# Patient Record
Sex: Male | Born: 1945 | Race: White | Hispanic: No | Marital: Married | Smoking: Former smoker
Health system: Southern US, Community
[De-identification: ages and names within clinical notes are randomized; demographics above are authoritative.]

## PROBLEM LIST (undated history)

## (undated) DIAGNOSIS — L570 Actinic keratosis: Secondary | ICD-10-CM

## (undated) DIAGNOSIS — E785 Hyperlipidemia, unspecified: Secondary | ICD-10-CM

## (undated) DIAGNOSIS — K2100 Gastro-esophageal reflux disease with esophagitis, without bleeding: Secondary | ICD-10-CM

## (undated) DIAGNOSIS — H409 Unspecified glaucoma: Secondary | ICD-10-CM

## (undated) DIAGNOSIS — G43909 Migraine, unspecified, not intractable, without status migrainosus: Secondary | ICD-10-CM

## (undated) DIAGNOSIS — T7840XA Allergy, unspecified, initial encounter: Secondary | ICD-10-CM

## (undated) DIAGNOSIS — Z974 Presence of external hearing-aid: Secondary | ICD-10-CM

## (undated) DIAGNOSIS — L812 Freckles: Secondary | ICD-10-CM

## (undated) HISTORY — DX: Gastro-esophageal reflux disease with esophagitis: K21.0

## (undated) HISTORY — PX: CATARACT EXTRACTION, BILATERAL: SHX1313

## (undated) HISTORY — PX: EYE SURGERY: SHX253

## (undated) HISTORY — PX: NASAL SINUS SURGERY: SHX719

## (undated) HISTORY — DX: Hyperlipidemia, unspecified: E78.5

## (undated) HISTORY — DX: Allergy, unspecified, initial encounter: T78.40XA

## (undated) HISTORY — PX: HERNIA REPAIR: SHX51

## (undated) HISTORY — DX: Migraine, unspecified, not intractable, without status migrainosus: G43.909

## (undated) HISTORY — DX: Freckles: L81.2

## (undated) HISTORY — DX: Unspecified glaucoma: H40.9

## (undated) HISTORY — DX: Gastro-esophageal reflux disease with esophagitis, without bleeding: K21.00

## (undated) HISTORY — PX: NASAL FRACTURE SURGERY: SHX718

## (undated) HISTORY — DX: Actinic keratosis: L57.0

---

## 2000-06-16 HISTORY — PX: UMBILICAL HERNIA REPAIR: SHX196

## 2004-08-28 ENCOUNTER — Ambulatory Visit: Payer: Self-pay | Admitting: Internal Medicine

## 2006-07-13 ENCOUNTER — Encounter: Payer: Self-pay | Admitting: Internal Medicine

## 2007-03-16 ENCOUNTER — Encounter: Payer: Self-pay | Admitting: Internal Medicine

## 2007-04-19 ENCOUNTER — Ambulatory Visit: Payer: Self-pay | Admitting: Internal Medicine

## 2007-04-19 DIAGNOSIS — E119 Type 2 diabetes mellitus without complications: Secondary | ICD-10-CM | POA: Insufficient documentation

## 2007-04-19 DIAGNOSIS — H409 Unspecified glaucoma: Secondary | ICD-10-CM | POA: Insufficient documentation

## 2007-04-19 DIAGNOSIS — E785 Hyperlipidemia, unspecified: Secondary | ICD-10-CM

## 2007-04-19 DIAGNOSIS — E1169 Type 2 diabetes mellitus with other specified complication: Secondary | ICD-10-CM | POA: Insufficient documentation

## 2007-04-19 HISTORY — DX: Unspecified glaucoma: H40.9

## 2007-05-06 ENCOUNTER — Ambulatory Visit: Payer: Self-pay | Admitting: Internal Medicine

## 2007-05-20 ENCOUNTER — Ambulatory Visit: Payer: Self-pay | Admitting: Internal Medicine

## 2007-05-20 ENCOUNTER — Encounter: Payer: Self-pay | Admitting: Internal Medicine

## 2007-05-20 LAB — HM COLONOSCOPY

## 2007-05-24 ENCOUNTER — Ambulatory Visit: Payer: Self-pay | Admitting: Internal Medicine

## 2007-05-24 LAB — CONVERTED CEMR LAB
Bilirubin Urine: NEGATIVE
Glucose, Urine, Semiquant: NEGATIVE
Nitrite: NEGATIVE
Protein, U semiquant: NEGATIVE
Specific Gravity, Urine: <1.005
pH: 7.5

## 2007-06-23 ENCOUNTER — Telehealth (INDEPENDENT_AMBULATORY_CARE_PROVIDER_SITE_OTHER): Payer: Self-pay | Admitting: *Deleted

## 2007-07-30 ENCOUNTER — Ambulatory Visit: Payer: Self-pay | Admitting: Internal Medicine

## 2007-07-30 DIAGNOSIS — M79609 Pain in unspecified limb: Secondary | ICD-10-CM

## 2007-08-19 ENCOUNTER — Encounter: Payer: Self-pay | Admitting: Internal Medicine

## 2007-08-23 ENCOUNTER — Ambulatory Visit: Payer: Self-pay | Admitting: Internal Medicine

## 2007-11-01 ENCOUNTER — Ambulatory Visit: Payer: Self-pay | Admitting: Internal Medicine

## 2007-11-01 DIAGNOSIS — G43909 Migraine, unspecified, not intractable, without status migrainosus: Secondary | ICD-10-CM | POA: Insufficient documentation

## 2007-11-04 LAB — CONVERTED CEMR LAB
ALT: 24 units/L (ref 0–53)
AST: 24 units/L (ref 0–37)
Alkaline Phosphatase: 41 units/L (ref 39–117)
BUN: 8 mg/dL (ref 6–23)
Bilirubin, Direct: 0.1 mg/dL (ref 0.0–0.3)
Creatinine, Ser: 1 mg/dL (ref 0.4–1.5)
Eosinophils Relative: 3 % (ref 0.0–5.0)
Glucose, Bld: 116 mg/dL — ABNORMAL HIGH (ref 70–99)
HDL: 31 mg/dL — ABNORMAL LOW (ref >39.0)
LDL Cholesterol: 56 mg/dL (ref 0–99)
Lymphocytes Relative: 44.4 % (ref 12.0–46.0)
Monocytes Absolute: 0.5 10*3/uL (ref 0.1–1.0)
Monocytes Relative: 9.5 % (ref 3.0–12.0)
Neutrophils Relative %: 42.4 % — ABNORMAL LOW (ref 43.0–77.0)
Phosphorus: 3.9 mg/dL (ref 2.3–4.6)
Platelets: 185 10*3/uL (ref 150–400)
Potassium: 4.1 meq/L (ref 3.5–5.1)
RDW: 12.6 % (ref 11.5–14.6)
TSH: 1.12 microintl units/mL (ref 0.35–5.50)
Total Bilirubin: 0.9 mg/dL (ref 0.3–1.2)
Total CHOL/HDL Ratio: 3.3
Total Protein: 6.9 g/dL (ref 6.0–8.3)
Triglycerides: 68 mg/dL (ref 0–149)
VLDL: 14 mg/dL (ref 0–40)
WBC: 5 10*3/uL (ref 4.5–10.5)

## 2008-04-20 ENCOUNTER — Telehealth: Payer: Self-pay | Admitting: Internal Medicine

## 2008-08-15 ENCOUNTER — Ambulatory Visit: Payer: Self-pay | Admitting: Ophthalmology

## 2008-10-31 ENCOUNTER — Ambulatory Visit: Payer: Self-pay | Admitting: Ophthalmology

## 2008-11-01 ENCOUNTER — Encounter: Payer: Self-pay | Admitting: Internal Medicine

## 2008-11-17 ENCOUNTER — Ambulatory Visit: Payer: Self-pay | Admitting: Internal Medicine

## 2008-11-17 DIAGNOSIS — M25579 Pain in unspecified ankle and joints of unspecified foot: Secondary | ICD-10-CM

## 2008-11-20 LAB — CONVERTED CEMR LAB
ALT: 19 units/L (ref 0–53)
AST: 19 units/L (ref 0–37)
Albumin: 4.6 g/dL (ref 3.5–5.2)
Calcium: 9.8 mg/dL (ref 8.4–10.5)
Chloride: 106 meq/L (ref 96–112)
HCT: 41.5 % (ref 39.0–52.0)
Hgb A1c MFr Bld: 6.8 % — ABNORMAL HIGH (ref 4.6–6.1)
Lymphocytes Relative: 50 % — ABNORMAL HIGH (ref 12–46)
Lymphs Abs: 2.4 10*3/uL (ref 0.7–4.0)
Microalb Creat Ratio: 10.3 mg/g (ref 0.0–30.0)
Monocytes Relative: 8 % (ref 3–12)
Neutrophils Relative %: 37 % — ABNORMAL LOW (ref 43–77)
PSA: 0.32 ng/mL (ref 0.10–4.00)
Platelets: 193 10*3/uL (ref 150–400)
Potassium: 4.3 meq/L (ref 3.5–5.3)
RBC: 4.41 M/uL (ref 4.22–5.81)
Total Protein: 7.4 g/dL (ref 6.0–8.3)
WBC: 4.9 10*3/uL (ref 4.0–10.5)

## 2008-11-21 ENCOUNTER — Telehealth: Payer: Self-pay | Admitting: Internal Medicine

## 2008-11-23 ENCOUNTER — Ambulatory Visit: Payer: Self-pay | Admitting: Family Medicine

## 2008-11-23 DIAGNOSIS — M7750 Other enthesopathy of unspecified foot: Secondary | ICD-10-CM | POA: Insufficient documentation

## 2008-12-29 ENCOUNTER — Encounter: Payer: Self-pay | Admitting: Internal Medicine

## 2009-01-02 ENCOUNTER — Ambulatory Visit: Payer: Self-pay | Admitting: Internal Medicine

## 2009-01-08 ENCOUNTER — Ambulatory Visit: Payer: Self-pay | Admitting: Internal Medicine

## 2009-01-08 DIAGNOSIS — L723 Sebaceous cyst: Secondary | ICD-10-CM

## 2009-03-15 ENCOUNTER — Ambulatory Visit: Payer: Self-pay | Admitting: Family Medicine

## 2009-03-15 DIAGNOSIS — M545 Low back pain: Secondary | ICD-10-CM

## 2009-03-27 ENCOUNTER — Ambulatory Visit: Payer: Self-pay | Admitting: Family Medicine

## 2009-03-30 ENCOUNTER — Ambulatory Visit: Payer: Self-pay | Admitting: Family Medicine

## 2009-04-05 ENCOUNTER — Encounter: Payer: Self-pay | Admitting: Family Medicine

## 2009-04-05 ENCOUNTER — Encounter: Payer: Self-pay | Admitting: Internal Medicine

## 2009-04-09 ENCOUNTER — Telehealth: Payer: Self-pay | Admitting: Family Medicine

## 2009-04-13 ENCOUNTER — Encounter: Payer: Self-pay | Admitting: Family Medicine

## 2009-04-16 ENCOUNTER — Encounter: Payer: Self-pay | Admitting: Family Medicine

## 2009-04-18 ENCOUNTER — Encounter: Payer: Self-pay | Admitting: Family Medicine

## 2009-04-23 ENCOUNTER — Telehealth: Payer: Self-pay | Admitting: Family Medicine

## 2009-06-19 ENCOUNTER — Ambulatory Visit: Payer: Self-pay | Admitting: Family Medicine

## 2009-06-19 DIAGNOSIS — R1310 Dysphagia, unspecified: Secondary | ICD-10-CM | POA: Insufficient documentation

## 2009-06-22 ENCOUNTER — Encounter: Payer: Self-pay | Admitting: Internal Medicine

## 2009-06-28 ENCOUNTER — Ambulatory Visit: Payer: Self-pay | Admitting: Gastroenterology

## 2009-06-28 ENCOUNTER — Encounter: Payer: Self-pay | Admitting: Internal Medicine

## 2010-03-18 LAB — HM DIABETES EYE EXAM

## 2010-05-05 ENCOUNTER — Ambulatory Visit: Payer: Self-pay | Admitting: Family Medicine

## 2010-05-27 ENCOUNTER — Ambulatory Visit: Payer: Self-pay | Admitting: Internal Medicine

## 2010-05-30 LAB — CONVERTED CEMR LAB
Cholesterol: 192 mg/dL (ref 0–200)
HDL: 39.2 mg/dL (ref >39.00)
Hgb A1c MFr Bld: 7 % — ABNORMAL HIGH (ref 4.6–6.5)
Microalb Creat Ratio: 0.3 mg/g (ref 0.0–30.0)
Triglycerides: 79 mg/dL (ref 0.0–149.0)

## 2010-06-13 ENCOUNTER — Encounter: Payer: Self-pay | Admitting: Internal Medicine

## 2010-07-16 NOTE — Consult Note (Signed)
Summary: GI Consult/[facility]  GI Consult/[facility]   Imported By: Sherian Rein 07/06/2009 15:02:23  _____________________________________________________________________  External Attachment:    Type:   Image     Comment:   External Document  Appended Document: GI Consult/[facility] planning EGD for dysphagia

## 2010-07-16 NOTE — Procedures (Signed)
Summary: Upper GI Endoscopy by Dr.Shaukat Iftikhar  Upper GI Endoscopy by Dr.Shaukat Niel Hummer   Imported By: Beau Fanny 07/04/2009 10:46:12  _____________________________________________________________________  External Attachment:    Type:   Image     Comment:   External Document  Appended Document: Upper GI Endoscopy by Dr.Shaukat Niel Hummer reflux esophagitis found ON nexium

## 2010-07-16 NOTE — Assessment & Plan Note (Signed)
Summary: TROUBLE SWALLOWING/CLE   Vital Signs:  Patient profile:   65 year old male Height:      70.5 inches Weight:      209.25 pounds BMI:     29.71 Temp:     97.6 degrees F oral Pulse rate:   68 / minute Pulse rhythm:   regular BP sitting:   122 / 80  (left arm) Cuff size:   large  Vitals Entered By: Lewanda Rife LPN (June 19, 2009 2:38 PM)  CC:  trouble swallowing and sometimes pt thinks related to stress.  History of Present Illness: Here for difficulty swallowing --chocking x 3+  in past yr--ham, chicken x2, beef--has not told anyone about this--if waits, will go down, if drinks water will choke and vomits back.  no difficulty breathing unless tries to drink.  Bagel has caused recently, no other foods have caused  problems --new problem and a bit more frequent lately --was seen by GI at [facility] for increased gas 4-5 yrs ago, wants to return there for care  Has been under lots of stress at work--does not gobble food, chews food throughly moreso since onset of difficulty getting some food down ---sees no correlation in types of food or occasion of occurance.  Allergies: 1)  ! * Codiene 2)  ! Januvia (Sitagliptin Phosphate) 3)  Actos (Pioglitazone Hcl)  Past History:  Past Medical History: Reviewed history from 04/19/2007 and no changes required. Diabetes mellitus, type II   ~2000 Hyperlipidemia Glaucoma--[facility] Migraines since teenager  Review of Systems GI:  See HPI. Psych:  Complains of anxiety.  Physical Exam  General:  alert, well-developed, well-nourished, and well-hydrated.  NAD, wwt gain of 5 lbs since last visit 03/30/2009 Lungs:  normal respiratory effort, no intercostal retractions, no accessory muscle use, normal breath sounds, no crackles, and no wheezes.   Heart:  normal rate, regular rhythm, and no murmur.   Abdomen:  soft, non-tender, normal bowel sounds, no distention, no masses, no guarding, no abdominal hernia, no hepatomegaly,  and no splenomegaly.   Neurologic:  alert & oriented X3, sensation intact to light touch, and gait normal.   Psych:  normally interactive and good eye contact.     Impression & Recommendations:  Problem # 1:  DYSPHAGIA UNSPECIFIED (ICD-787.20) Assessment New new onset of difficulty swallowing solid dense foods--mainly meat--at irregular times etiology of this is unclear will refer to GI for further eval--he agrees Orders: Gastroenterology Referral (GI)  Complete Medication List: 1)  Metformin Hcl 500 Mg Tb24 (Metformin hcl) .Marland Kitchen.. 1 in am and 2 in pm 2)  Simvastatin 40 Mg Tabs (Simvastatin) .... Take 1 tablet by mouth once a day 3)  Travatan 0.004 % Soln (Travoprost) .Marland Kitchen.. 1 drop daily to both eyes 4)  Baby Aspirin 81 Mg Chew (Aspirin) .Marland Kitchen.. 1 daily 5)  Accu-chek Advantage Test Strp (Glucose blood) .... Check twice a day 6)  Timynol  .... One gtt each eye in am and pm 7)  Magnesium Plus Zinc  .... Otc as directed.  takes tow daily 8)  Multivitamins Tabs (Multiple vitamin) .... One daily 9)  Fish Oil Oil (Fish oil) .... Takes one daily  Patient Instructions: 1)  refer to [facility]--difficulty swallowing  Current Allergies (reviewed today): ! * CODIENE ! JANUVIA (SITAGLIPTIN PHOSPHATE) ACTOS (PIOGLITAZONE HCL)

## 2010-07-18 NOTE — Assessment & Plan Note (Signed)
Summary: F/U DIABETES/CLE   Vital Signs:  Patient profile:   65 year old male Weight:      210 pounds BMI:     29.81 Temp:     98.6 degrees F oral Pulse rate:   60 / minute Pulse rhythm:   regular BP sitting:   128 / 80  (left arm) Cuff size:   large  Vitals Entered By: Mervin Hack CMA Duncan Dull) (May 27, 2010 3:47 PM) CC: diabetes follow-up   History of Present Illness: has been doing well Checks sugars about once a week Consistently under 120 unless he is not careful with diet Occ as high as 170 if non compliant No sig hypoglycemic spells Has been doing "slow carbs" eating-----low glycemic index foods Keeps up with ophtho Only gets intermittent foot pain---has known neuroma may get some pain if on his feet for a while  Off the simvastatin chol had been low and it made him tired so he stopped  Allergies: 1)  ! * Codiene 2)  ! Januvia (Sitagliptin Phosphate) 3)  Actos (Pioglitazone Hcl)  Past History:  Past medical, surgical, family and social histories (including risk factors) reviewed for relevance to current acute and chronic problems.  Past Medical History: Reviewed history from 04/19/2007 and no changes required. Diabetes mellitus, type II   ~2000 Hyperlipidemia Glaucoma--[facility] Migraines since teenager  Past Surgical History: Reviewed history from 11/17/2008 and no changes required. Metal removed from right eye Umbilical herniorrhaphy--2002   Cataract extraction bilaterally  3/10, 5/10  --Dr Druscilla Brownie  Family History: Reviewed history from 04/19/2007 and no changes required. Dad died of MI @81  Mom died of MI @80 .  Had DM 4 brothers, 3 sisters 2 brothers have had MI in 64's. 3 brothers and all sisters are diabetic DM strong on Mom's side No colon or prostate cancer Dad and sister with colon polyps  Social History: Reviewed history from 05/24/2007 and no changes required. 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   Neck:  supple, no masses, and no cervical lymphadenopathy.   Lungs:  normal respiratory effort, no intercostal retractions, no accessory muscle use, and normal breath sounds.   Heart:  normal rate, regular rhythm, no murmur, and no gallop.   Abdomen:  soft and non-tender.   Msk:  no joint tenderness and no joint swelling.   Pulses:  normal in feet Extremities:  no edema Psych:  normally interactive, good eye contact, not anxious appearing, and not depressed appearing.    Diabetes Management Exam:    Foot Exam (with socks and/or shoes not present):       Sensory-Pinprick/Light touch:          Left medial foot (L-4): normal          Left dorsal foot (L-5): normal          Left lateral foot (S-1): normal          Right medial foot (L-4): normal          Right dorsal foot (L-5): normal          Right lateral foot (S-1): normal       Inspection:          Left foot: normal  Right foot: normal       Nails:          Left foot: normal          Right foot: normal    Eye Exam:       Eye Exam done elsewhere          Date: 03/18/2010          Results: no retinopathy. Has glaucoma          Done by: Dr Druscilla Brownie   Impression & Recommendations:  Problem # 1:  DIABETES MELLITUS, TYPE II (ICD-250.00) Assessment Unchanged  seems to have reasonable control will recheck A1c and microal  His updated medication list for this problem includes:    Metformin Hcl 500 Mg Tb24 (Metformin hcl) .Marland Kitchen... 1 in am and 2 in pm    Baby Aspirin 81 Mg Chew (Aspirin) .Marland Kitchen... 1 daily  Labs Reviewed: Creat: 0.95 (11/17/2008)     Last Eye Exam: no retinopathy. Has glaucoma (03/18/2010) Reviewed HgBA1c results: 6.8 (11/17/2008)  7.0 (11/01/2007)  Orders: TLB-A1C / Hgb A1C  (Glycohemoglobin) (83036-A1C) TLB-Microalbumin/Creat Ratio, Urine (82043-MALB)  Problem # 2:  HYPERLIPIDEMIA (ICD-272.4) Assessment: Unchanged  had been low on Rx couldn't tolerate med will not treat now even if up a lot---discuss at next visit  The following medications were removed from the medication list:    Simvastatin 40 Mg Tabs (Simvastatin) .Marland Kitchen... Take 1 tablet by mouth once a day  Labs Reviewed: SGOT: 19 (11/17/2008)   SGPT: 19 (11/17/2008)   HDL:32 (11/17/2008), 31.0 (11/01/2007)  LDL:52 (11/17/2008), 56 (11/01/2007)  Chol:110 (11/17/2008), 101 (11/01/2007)  Trig:132 (11/17/2008), 68 (11/01/2007)  Orders: TLB-Lipid Panel (80061-LIPID) Venipuncture (09811)  Complete Medication List: 1)  Metformin Hcl 500 Mg Tb24 (Metformin hcl) .Marland Kitchen.. 1 in am and 2 in pm 2)  Travatan 0.004 % Soln (Travoprost) .Marland Kitchen.. 1 drop daily to both eyes 3)  Baby Aspirin 81 Mg Chew (Aspirin) .Marland Kitchen.. 1 daily 4)  Accu-chek Advantage Test Strp (Glucose blood) .... Check twice a day 5)  Multivitamins Tabs (Multiple vitamin) .... One daily 6)  Fish Oil Oil (Fish oil) .... Takes one daily 7)  Timolol Maleate 0.25 % Soln (Timolol maleate) .Marland Kitchen.. 1 drop in each eye two times a day 8)  Calcium-magnesium-zinc 333-133-8.3 Mg Tabs (Calcium-magnesium-zinc) .... Two times a day 9)  B Complex Tabs (B complex vitamins) .... As needed 10)  Chromium Picolinate 200 Mcg Tabs (Chromium picolinate) .... As needed  Patient Instructions: 1)  Please schedule a follow-up appointment in 6 months for physical   Orders Added: 1)  Est. Patient Level III [91478] 2)  TLB-Lipid Panel [80061-LIPID] 3)  Venipuncture [36415] 4)  TLB-A1C / Hgb A1C (Glycohemoglobin) [83036-A1C] 5)  TLB-Microalbumin/Creat Ratio, Urine [82043-MALB]   Immunization History:  Influenza Immunization History:    Influenza:  historical (04/16/2010)  Pneumovax Immunization History:    Pneumovax:  historical (04/16/2010)   Immunization History:  Influenza  Immunization History:    Influenza:  Historical (04/16/2010)  Pneumovax Immunization History:    Pneumovax:  Historical (04/16/2010)  Current Allergies (reviewed today): ! * CODIENE ! JANUVIA (SITAGLIPTIN PHOSPHATE) ACTOS (PIOGLITAZONE HCL)

## 2010-07-18 NOTE — Letter (Signed)
Summary: Physical Exam for [facility]  Physical Exam for DMV   Imported By: Maryln Gottron 06/20/2010 14:49:37  _____________________________________________________________________  External Attachment:    Type:   Image     Comment:   External Document

## 2010-08-21 ENCOUNTER — Telehealth: Payer: Self-pay | Admitting: Internal Medicine

## 2010-08-21 DIAGNOSIS — R413 Other amnesia: Secondary | ICD-10-CM | POA: Insufficient documentation

## 2010-08-27 NOTE — Progress Notes (Signed)
Summary: wants referral to neurologist  Phone Note Call from Patient Call back at Home Phone [phone]   Caller: Hulan Fray Summary of Call: Pt's wife states pt needs to be evaluated by a neurologist for memory loss and is asking for a referral.  She says he has discussed this with Dr.Peytin Dechert in the past.  She says he has had some serious head injuries in his life and wants to be checked. He wants to go to [facility] and wants appt ASAP.   I advised her that Dr. Alphonsus Sias may say that he needs to be seen here first, but she doesnt think that would be necessary.             Initial call taken by: Lowella Petties CMA, AAMA,  August 21, 2010 4:04 PM  Follow-up for Phone Call        tried calling pt's home number and it's been disconnected, tried looking thru chart and EPIC but the number is the same. DeShannon Smith CMA Duncan Dull)  August 21, 2010 5:07 PM    I don't remember these discussions and I normally can start any work up necessary for memory problems, but if she feels strongly about a referral, I am okay with making the referral Follow-up by: Cindee Salt MD,  August 22, 2010 5:14 PM  Additional Follow-up for Phone Call Additional follow up Details #1::        Called Olegario Messier wife at [phone] and lmom for her to call me back. [facility] is scheduling out to [id number]. Additional Follow-up by: Carlton Adam,  August 23, 2010 10:47 AM  New Problems: MEMORY LOSS (ICD-780.93)   New Problems: MEMORY LOSS (ICD-780.93)

## 2010-10-02 ENCOUNTER — Other Ambulatory Visit: Payer: Self-pay | Admitting: Internal Medicine

## 2010-11-07 ENCOUNTER — Other Ambulatory Visit: Payer: Self-pay | Admitting: *Deleted

## 2010-11-07 MED ORDER — GLUCOSE BLOOD VI STRP: ORAL_STRIP | Status: AC

## 2010-11-15 ENCOUNTER — Encounter: Payer: Self-pay | Admitting: Internal Medicine

## 2010-11-18 ENCOUNTER — Encounter: Payer: Self-pay | Admitting: Internal Medicine

## 2010-12-25 ENCOUNTER — Ambulatory Visit (INDEPENDENT_AMBULATORY_CARE_PROVIDER_SITE_OTHER): Payer: BC Managed Care – PPO | Admitting: Internal Medicine

## 2010-12-25 ENCOUNTER — Encounter: Payer: Self-pay | Admitting: Internal Medicine

## 2010-12-25 DIAGNOSIS — E119 Type 2 diabetes mellitus without complications: Secondary | ICD-10-CM

## 2010-12-25 DIAGNOSIS — E785 Hyperlipidemia, unspecified: Secondary | ICD-10-CM

## 2010-12-25 DIAGNOSIS — Z2911 Encounter for prophylactic immunotherapy for respiratory syncytial virus (RSV): Secondary | ICD-10-CM

## 2010-12-25 DIAGNOSIS — Z0000 Encounter for general adult medical examination without abnormal findings: Secondary | ICD-10-CM

## 2010-12-25 HISTORY — DX: Encounter for general adult medical examination without abnormal findings: Z00.00

## 2010-12-25 LAB — MICROALBUMIN / CREATININE URINE RATIO
Creatinine,U: 178 mg/dL
Microalb Creat Ratio: 0.3 mg/g (ref 0.0–30.0)
Microalb, Ur: 0.5 mg/dL (ref 0.0–1.9)

## 2010-12-25 LAB — CBC WITH DIFFERENTIAL/PLATELET
Basophils Absolute: 0 10*3/uL (ref 0.0–0.1)
Eosinophils Relative: 3.6 % (ref 0.0–5.0)
HCT: 44.3 % (ref 39.0–52.0)
Lymphs Abs: 2.2 10*3/uL (ref 0.7–4.0)
Monocytes Absolute: 0.5 10*3/uL (ref 0.1–1.0)
Monocytes Relative: 8.8 % (ref 3.0–12.0)
Neutrophils Relative %: 44.8 % (ref 43.0–77.0)
Platelets: 182 10*3/uL (ref 150.0–400.0)
RDW: 13.6 % (ref 11.5–14.6)
WBC: 5.3 10*3/uL (ref 4.5–10.5)

## 2010-12-25 LAB — HEPATIC FUNCTION PANEL
Albumin: 4.8 g/dL (ref 3.5–5.2)
Alkaline Phosphatase: 40 U/L (ref 39–117)
Total Bilirubin: 0.7 mg/dL (ref 0.3–1.2)

## 2010-12-25 LAB — BASIC METABOLIC PANEL
Chloride: 108 mEq/L (ref 96–112)
GFR: 87.16 mL/min (ref >60.00)
Potassium: 4.6 mEq/L (ref 3.5–5.1)
Sodium: 140 mEq/L (ref 135–145)

## 2010-12-25 LAB — LIPID PANEL
Cholesterol: 187 mg/dL (ref 0–200)
Triglycerides: 84 mg/dL (ref 0.0–149.0)

## 2010-12-25 LAB — HEMOGLOBIN A1C: Hgb A1c MFr Bld: 7.6 % — ABNORMAL HIGH (ref 4.6–6.5)

## 2010-12-25 LAB — TSH: TSH: 1.08 u[IU]/mL (ref 0.35–5.50)

## 2010-12-25 NOTE — Assessment & Plan Note (Signed)
Lab Results  Component Value Date   LDLCALC 137* 05/27/2010   Discussed problems with statin If LDL still over 130, will try atorvastatin 5mg 

## 2010-12-25 NOTE — Assessment & Plan Note (Signed)
Doing well Better fitness efforts Discussed PSA--will check zostavax today

## 2010-12-25 NOTE — Progress Notes (Signed)
Subjective:    Patient ID: Alexis Sharp, male    DOB: 1945/12/26, 65 y.o.   MRN: [id number]  HPI Doing well Feels better off the cholesterol med May be willing to try new med if up  Intermittent with dietary restraint Does have regimen he is steady with it Good exercise regimen---walk/runs 2 miles at least 4 days per week Checks sugars every 2-3 days Fasting usually 150-170---goes down during the day  Current Outpatient Prescriptions on File Prior to Visit  Medication Sig Dispense Refill  . aspirin 81 MG chewable tablet Chew 81 mg by mouth daily.        Marland Kitchen b complex vitamins tablet Take 1 tablet by mouth daily.        Marland Kitchen CALCIUM-MAGNESUIUM-ZINC 333-133-8.3 MG TABS Take by mouth 2 (two) times daily.        . Chromium Picolinate 200 MCG TABS Take by mouth as needed.        . fish oil-omega-3 fatty acids 1000 MG capsule Take 2 g by mouth daily.        Marland Kitchen glucose blood (ACCU-CHEK ACTIVE STRIPS) test strip Use as instructed  100 each  12  . metFORMIN (GLUCOPHAGE) 500 MG tablet TAKE ONE TABLET BY MOUTH IN THE MORNING AND TWO TABLETS IN THE EVENING  270 tablet  0  . Multiple Vitamin (MULTIVITAMIN) capsule Take 1 capsule by mouth daily.        . timolol (BETIMOL) 0.25 % ophthalmic solution 1-2 drops 2 (two) times daily.        . travoprost, benzalkonium, (TRAVATAN) 0.004 % ophthalmic solution 1 drop at bedtime.          Allergies  Allergen Reactions  . Codeine   . Pioglitazone     REACTION: achy  . Sitagliptin Phosphate     REACTION: muscle aches    Past Medical History  Diagnosis Date  . Diabetes mellitus   . Hyperlipidemia   . Glaucoma   . Migraines     Past Surgical History  Procedure Date  . Umbilical hernia repair 2002  . Cataract extraction, bilateral     Family History  Problem Relation Age of Onset  . Diabetes Mother   . Diabetes Sister   . Heart disease Brother   . Diabetes Brother   . Heart disease Brother   . Diabetes Brother   . Diabetes Brother   .  Diabetes Sister   . Diabetes Sister     History   Social History  . Marital Status: Married    Spouse Name: N/A    Number of Children: 2  . Years of Education: N/A   Occupational History  . 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 Smoking status: Former Games developer  . Smokeless tobacco: Not on file  . Alcohol Use: No  . Drug Use: Not on file  . Sexually Active: Not on file   Other Topics Concern  . Not on file   Social History Narrative  . No narrative on file   Review of Systems  Constitutional: Negative for fatigue and unexpected weight change.       Wears seat belt  HENT: Positive for hearing loss. Negative for congestion, rhinorrhea, dental problem and tinnitus.        Has hearing aides Regular with dentist  Eyes: Negative for visual disturbance.       No diplopia of focal vision loss  Respiratory: Negative for  cough, chest tightness and shortness of breath.   Cardiovascular: Negative for chest pain, palpitations and leg swelling.  Gastrointestinal: Negative for nausea, vomiting, abdominal pain, constipation and blood in stool.       Occ heartburn if eats the wrong things---tums helps (every 2-3 months)  Genitourinary: Negative for dysuria, frequency and difficulty urinating.       No sexual problems  Musculoskeletal: Negative for back pain, joint swelling and arthralgias.  Skin: Negative for rash.       No worrisome lesions  Neurological: Positive for headaches. Negative for dizziness, syncope, weakness, light-headedness and numbness.       Migraines in past---advil helps  Hematological: Negative for adenopathy. Bruises/bleeds easily.       Bruises easy on arms  Psychiatric/Behavioral: Positive for dysphoric mood. Negative for sleep disturbance. The patient is not nervous/anxious.        Bout of depression which seems to have responded to increased exercise       Objective:   Physical Exam  Constitutional: He  is oriented to person, place, and time. He appears well-developed and well-nourished. No distress.  HENT:  Head: Normocephalic and atraumatic.  Right Ear: External ear normal.  Left Ear: External ear normal.  Mouth/Throat: Oropharynx is clear and moist. No oropharyngeal exudate.       TMs normal  Eyes: Conjunctivae and EOM are normal. Pupils are equal, round, and reactive to light.       Fundi benign  Neck: Normal range of motion. Neck supple. No thyromegaly present.  Cardiovascular: Normal rate, regular rhythm, normal heart sounds and intact distal pulses.  Exam reveals no gallop.   No murmur heard. Pulmonary/Chest: Effort normal and breath sounds normal. No respiratory distress. He has no wheezes. He has no rales.  Abdominal: Soft. He exhibits no mass. There is no tenderness.  Musculoskeletal: Normal range of motion. He exhibits no edema and no tenderness.  Lymphadenopathy:    He has no cervical adenopathy.  Neurological: He is alert and oriented to person, place, and time. He exhibits normal muscle tone.       Normal strength and gait Normal sensation in plantar feet  Skin: Skin is warm. No rash noted.  Psychiatric: He has a normal mood and affect. His behavior is normal. Judgment and thought content normal.          Assessment & Plan:

## 2010-12-25 NOTE — Assessment & Plan Note (Signed)
High fasting sugars but overall control hopefully still okay Lab Results  Component Value Date   HGBA1C 7.0* 05/27/2010   Will check labs again

## 2011-02-14 ENCOUNTER — Other Ambulatory Visit: Payer: Self-pay | Admitting: Internal Medicine

## 2011-04-08 ENCOUNTER — Telehealth: Payer: Self-pay | Admitting: *Deleted

## 2011-04-09 NOTE — Telephone Encounter (Signed)
Chart opened in error

## 2011-12-04 ENCOUNTER — Telehealth: Payer: Self-pay | Admitting: Internal Medicine

## 2011-12-04 NOTE — Telephone Encounter (Signed)
Recall patient coming into office and explaining that the form would require an office visit due to him not being into the office for a visit in a while and that on 5/24 the schedule had a physical scheduled in June that the patient cancelled.  I will call the patient to follow up on cancelled visit and form in order to provide more info to Dr. Alphonsus Sias on if he will need an office visit to complete the form needed.

## 2011-12-04 NOTE — Telephone Encounter (Signed)
I can probably fill out the form with a routine diabetes follow up, then no extra charge Will schedule the physical after that visit (like in 6 months)

## 2011-12-04 NOTE — Telephone Encounter (Signed)
But he is overdue for follow up (due in January) If he doesn't schedule appt now, he needs to be seen for physical after the July date of his last physical

## 2011-12-04 NOTE — Telephone Encounter (Signed)
Patient said he dropped off a form from the [facility] with a check.  He said he gave the form to [place] and she said she'd give it to [facility] when he returned from vacation.  Patient received a letter of suspension from the [facility] because they said they didn't receive the form. 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

## 2011-12-04 NOTE — Telephone Encounter (Signed)
Spoke with patient and apologized and we discussed our conversation when he dropped off the form and that I would need to find out if he needed a physical to complete the form based on date of his last physical and type of form.  I thought that I had talked to the patient about scheduling a physical and he said he did not and the patient stated it is a simple DMV form for diabetics and should not require a physical.    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  He said that he has a 30 day extension and will bring the form in tomorrow.  I again apologized for any inconvenience and patient was understanding and appreciative for the call back.  He said that he just needs to get the form completed and I told him that we would gladly assist.

## 2011-12-05 NOTE — Telephone Encounter (Signed)
I spoke with patient and he said he already has a physical scheduled on 02/24/12.  Patient said he's not having any problems with his diabetes.

## 2011-12-05 NOTE — Telephone Encounter (Signed)
That will have to be okay He was seen within a year so I can do the form

## 2011-12-09 NOTE — Telephone Encounter (Signed)
Forms hand delivered to post office and mailed to [facility] [employer] yesterday.  Please see scanned documents for tracking number and documentation. Called patient to notify him.

## 2011-12-29 ENCOUNTER — Telehealth: Payer: Self-pay

## 2011-12-29 NOTE — Telephone Encounter (Signed)
Alexis Sharp, pts wife brought letter from [facility] needing letter with add'l info on Hbg A1C level. 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 Pt has CPX scheduled 02/24/12. Pts wife request our office to mail letter prior to deadline(30 days from 12/17/11; pt was out of town so just brought letter 12/29/11).Call Alexis Sharp when letter mailed. Copy of DMV request in Dr Karle Starch in box.

## 2011-12-31 ENCOUNTER — Encounter: Payer: Self-pay | Admitting: Internal Medicine

## 2011-12-31 DIAGNOSIS — Z0279 Encounter for issue of other medical certificate: Secondary | ICD-10-CM

## 2011-12-31 NOTE — Telephone Encounter (Signed)
Left message on pt's wife's voice mail to let her know I'm mailing the letter to the [facility] and that there will be a $20 charge.

## 2011-12-31 NOTE — Telephone Encounter (Signed)
Letter done There is a $20 charge for this  Please send it via mail

## 2012-02-24 ENCOUNTER — Ambulatory Visit (INDEPENDENT_AMBULATORY_CARE_PROVIDER_SITE_OTHER): Payer: BC Managed Care – PPO | Admitting: Internal Medicine

## 2012-02-24 ENCOUNTER — Encounter: Payer: Self-pay | Admitting: Internal Medicine

## 2012-02-24 VITALS — BP 148/80 | HR 78 | Temp 97.8°F | Ht 70.0 in | Wt 207.0 lb

## 2012-02-24 DIAGNOSIS — Z0000 Encounter for general adult medical examination without abnormal findings: Secondary | ICD-10-CM

## 2012-02-24 DIAGNOSIS — E119 Type 2 diabetes mellitus without complications: Secondary | ICD-10-CM

## 2012-02-24 DIAGNOSIS — E785 Hyperlipidemia, unspecified: Secondary | ICD-10-CM

## 2012-02-24 LAB — HEPATIC FUNCTION PANEL
ALT: 17 U/L (ref 0–53)
AST: 18 U/L (ref 0–37)
Alkaline Phosphatase: 47 U/L (ref 39–117)
Bilirubin, Direct: 0.1 mg/dL (ref 0.0–0.3)
Total Protein: 7.6 g/dL (ref 6.0–8.3)

## 2012-02-24 LAB — CBC WITH DIFFERENTIAL/PLATELET
Basophils Relative: 0.7 % (ref 0.0–3.0)
Eosinophils Relative: 3.7 % (ref 0.0–5.0)
HCT: 45.3 % (ref 39.0–52.0)
Lymphs Abs: 2.2 10*3/uL (ref 0.7–4.0)
MCHC: 33.5 g/dL (ref 30.0–36.0)
MCV: 98.6 fl (ref 78.0–100.0)
Monocytes Absolute: 0.5 10*3/uL (ref 0.1–1.0)
Neutro Abs: 2.6 10*3/uL (ref 1.4–7.7)
RBC: 4.59 Mil/uL (ref 4.22–5.81)
WBC: 5.5 10*3/uL (ref 4.5–10.5)

## 2012-02-24 LAB — LIPID PANEL: Cholesterol: 205 mg/dL — ABNORMAL HIGH (ref 0–200)

## 2012-02-24 LAB — HEMOGLOBIN A1C: Hgb A1c MFr Bld: 7.7 % — ABNORMAL HIGH (ref 4.6–6.5)

## 2012-02-24 LAB — MICROALBUMIN / CREATININE URINE RATIO: Microalb Creat Ratio: 0.6 mg/g (ref 0.0–30.0)

## 2012-02-24 LAB — BASIC METABOLIC PANEL
CO2: 27 mEq/L (ref 19–32)
Chloride: 103 mEq/L (ref 96–112)
Potassium: 4.7 mEq/L (ref 3.5–5.1)

## 2012-02-24 MED ORDER — METFORMIN HCL 500 MG PO TABS: 500.0000 mg | ORAL_TABLET | Freq: Three times a day (TID) | ORAL | Status: DC

## 2012-02-24 NOTE — Assessment & Plan Note (Signed)
Generally doing okay Needs to work more on fitness but has trouble with dietary compliance Discussed PSA---will defer at least to next year

## 2012-02-24 NOTE — Progress Notes (Signed)
Subjective:    Patient ID: Alexis Sharp, male    DOB: Nov 14, 1945, 66 y.o.   MRN: [id number]  HPI Here for physical No new concerns  Checks sugars 2-3 per week Fasting 120 but may be up to 180 No hypoglycemic reactions Has been "battling" eating right, etc--knows what he should do but has "psychological" issues with maintenance Hasn't been able to lose weight Tries to go to gym regularly---usually 4 days per week Hard to give up snacks  Discussed cholesterol Rx He is not excited about meds for this  Current Outpatient Prescriptions on File Prior to Visit  Medication Sig Dispense Refill  . aspirin 81 MG chewable tablet Chew 81 mg by mouth daily.        Marland Kitchen b complex vitamins tablet Take 1 tablet by mouth daily.        Marland Kitchen CALCIUM-MAGNESUIUM-ZINC 333-133-8.3 MG TABS Take by mouth 2 (two) times daily.        . Chromium Picolinate 200 MCG TABS Take by mouth as needed.        . fish oil-omega-3 fatty acids 1000 MG capsule Take 2 g by mouth daily.        . Multiple Vitamin (MULTIVITAMIN) capsule Take 1 capsule by mouth daily.        Marland Kitchen DISCONTD: metFORMIN (GLUCOPHAGE) 500 MG tablet TAKE ONE TABLET BY MOUTH IN THE MORNING AND TWO TABLETS BY MOUTH IN THE EVENING  270 tablet  3    Allergies  Allergen Reactions  . Codeine   . Pioglitazone     REACTION: achy  . Sitagliptin Phosphate     REACTION: muscle aches    Past Medical History  Diagnosis Date  . Diabetes mellitus   . Hyperlipidemia   . Glaucoma   . Migraines     Past Surgical History  Procedure Date  . Umbilical hernia repair 2002  . Cataract extraction, bilateral     Family History  Problem Relation Age of Onset  . Diabetes Mother   . Diabetes Sister   . Heart disease Brother   . Diabetes Brother   . Heart disease Brother   . Diabetes Brother   . Diabetes Brother   . Diabetes Sister   . Diabetes Sister     History   Social History  . Marital Status: Married    Spouse Name: N/A    Number of Children: 2  .  Years of Education: N/A   Occupational History  . semi retired Surveyor, quantity asst,  does Interior and spatial designer    Social History Main Topics  . Smoking status: Former Games developer  . Smokeless tobacco: Never Used  . Alcohol Use: No  . Drug Use: Not on file  . Sexually Active: Not on file   Other Topics Concern  . Not on file   Social History Narrative   No living willNo health care POA---doesn't want to consider this now (02/24/12)   Review of Systems  Constitutional: Negative for fatigue and unexpected weight change.       Wears seat belt  HENT: Positive for congestion and rhinorrhea. Negative for hearing loss, dental problem and tinnitus.        Recent sinus infection--got z-pak No meds for allergies Regular with dentist  Eyes: Negative for visual disturbance.       No diplopia or unilateral vision loss  Respiratory: Negative for chest tightness, shortness of breath and stridor.   Cardiovascular: Negative for chest pain, palpitations and leg swelling.  Gastrointestinal: Negative  for nausea, vomiting, abdominal pain, constipation and blood in stool.       Occ acid reflux---uses tums prn (infrequently)  Genitourinary: Negative for urgency, frequency and difficulty urinating.       No sexual problems  Musculoskeletal: Positive for back pain. Negative for joint swelling and arthralgias.       Occ mild backaches  Skin: Negative for rash.       Has lesion on left shoulder---no recent change  Neurological: Negative for dizziness, syncope, weakness, light-headedness, numbness and headaches.  Hematological: Negative for adenopathy. Bruises/bleeds easily.  Psychiatric/Behavioral: Negative for disturbed wake/sleep cycle and dysphoric mood. The patient is nervous/anxious.        Occ anxiety spells at night--no persistent problems       Objective:   Physical Exam  Constitutional: He appears well-developed and well-nourished. No distress.  HENT:  Head: Normocephalic and atraumatic.  Right  Ear: External ear normal.  Left Ear: External ear normal.  Mouth/Throat: Oropharynx is clear and moist. No oropharyngeal exudate.  Eyes: Conjunctivae and EOM are normal. Pupils are equal, round, and reactive to light.  Neck: Normal range of motion. Neck supple. No thyromegaly present.  Cardiovascular: Normal rate, regular rhythm, normal heart sounds and intact distal pulses.  Exam reveals no gallop.   No murmur heard. Pulmonary/Chest: Effort normal and breath sounds normal. No respiratory distress. He has no wheezes. He has no rales.  Abdominal: Soft. There is no tenderness.  Musculoskeletal: Normal range of motion. He exhibits no edema and no tenderness.  Lymphadenopathy:    He has no cervical adenopathy.  Neurological: He is alert.  Skin: Skin is warm. No rash noted. No erythema.  Psychiatric: He has a normal mood and affect. His behavior is normal. Thought content normal.          Assessment & Plan:

## 2012-02-24 NOTE — Assessment & Plan Note (Signed)
Not excited about meds Didn't tolerate crestor Will consider low dose atorvastatin if LDL over 130

## 2012-02-24 NOTE — Assessment & Plan Note (Signed)
High fasting sugars Discussed lantus---will consider starting if A1c over 8%

## 2012-02-26 ENCOUNTER — Other Ambulatory Visit: Payer: Self-pay | Admitting: *Deleted

## 2012-02-26 ENCOUNTER — Encounter: Payer: Self-pay | Admitting: *Deleted

## 2012-02-26 MED ORDER — ATORVASTATIN CALCIUM 10 MG PO TABS: 10.0000 mg | ORAL_TABLET | Freq: Every day | ORAL | Status: DC

## 2012-04-08 ENCOUNTER — Other Ambulatory Visit: Payer: Self-pay | Admitting: Internal Medicine

## 2012-04-08 DIAGNOSIS — E785 Hyperlipidemia, unspecified: Secondary | ICD-10-CM

## 2012-04-09 ENCOUNTER — Encounter: Payer: Self-pay | Admitting: Internal Medicine

## 2012-04-14 ENCOUNTER — Other Ambulatory Visit (INDEPENDENT_AMBULATORY_CARE_PROVIDER_SITE_OTHER): Payer: BC Managed Care – PPO

## 2012-04-14 DIAGNOSIS — E785 Hyperlipidemia, unspecified: Secondary | ICD-10-CM

## 2012-04-14 LAB — LIPID PANEL
HDL: 38.5 mg/dL — ABNORMAL LOW (ref >39.00)
LDL Cholesterol: 70 mg/dL (ref 0–99)
Total CHOL/HDL Ratio: 3
Triglycerides: 124 mg/dL (ref 0.0–149.0)

## 2012-04-14 LAB — HEPATIC FUNCTION PANEL
AST: 16 U/L (ref 0–37)
Albumin: 4.2 g/dL (ref 3.5–5.2)

## 2012-04-15 ENCOUNTER — Encounter: Payer: Self-pay | Admitting: *Deleted

## 2012-04-28 ENCOUNTER — Ambulatory Visit (INDEPENDENT_AMBULATORY_CARE_PROVIDER_SITE_OTHER): Payer: BC Managed Care – PPO | Admitting: Internal Medicine

## 2012-04-28 ENCOUNTER — Encounter: Payer: Self-pay | Admitting: Internal Medicine

## 2012-04-28 VITALS — BP 138/78 | HR 60 | Temp 97.6°F | Wt 209.0 lb

## 2012-04-28 DIAGNOSIS — L538 Other specified erythematous conditions: Secondary | ICD-10-CM

## 2012-04-28 DIAGNOSIS — L304 Erythema intertrigo: Secondary | ICD-10-CM | POA: Insufficient documentation

## 2012-04-28 MED ORDER — KETOCONAZOLE 2 % EX CREA: TOPICAL_CREAM | Freq: Two times a day (BID) | CUTANEOUS | Status: DC

## 2012-04-28 NOTE — Assessment & Plan Note (Signed)
Probably fungal but could just be irritative from the new drier sheets---he does tend to be sensitive Will try ketoconazole cream Okay to use the powder Probably should change back to the other drier sheets

## 2012-04-28 NOTE — Patient Instructions (Signed)
Please go back to the old drier sheets--just in case

## 2012-04-28 NOTE — Progress Notes (Signed)
  Subjective:    Patient ID: Alexis Sharp, male    DOB: Feb 23, 1946, 66 y.o.   MRN: [id number]  HPI Has itching in groin-- about 2 weeks No obvious rash  Using new drier sheets over that time--hasn't had time to change these  Tried some Gold [employer] powder--gave some relief Has tight feeling  Sugar control has been okay  Current Outpatient Prescriptions on File Prior to Visit  Medication Sig Dispense Refill  . aspirin 81 MG chewable tablet Chew 81 mg by mouth daily.        Marland Kitchen atorvastatin (LIPITOR) 10 MG tablet Take 1 tablet (10 mg total) by mouth daily.  90 tablet  3  . b complex vitamins tablet Take 1 tablet by mouth daily.        Marland Kitchen CALCIUM-MAGNESUIUM-ZINC 333-133-8.3 MG TABS Take by mouth 2 (two) times daily.        . Chromium Picolinate 200 MCG TABS Take by mouth as needed.        . dorzolamide-timolol (COSOPT) 22.3-6.8 MG/ML ophthalmic solution Place 1 drop into both eyes 2 (two) times daily.       . fish oil-omega-3 fatty acids 1000 MG capsule Take 2 g by mouth daily.        Marland Kitchen latanoprost (XALATAN) 0.005 % ophthalmic solution Place 1 drop into both eyes at bedtime.       . metFORMIN (GLUCOPHAGE) 500 MG tablet Take 1 tablet (500 mg total) by mouth 3 (three) times daily.  270 tablet  3  . Multiple Vitamin (MULTIVITAMIN) capsule Take 1 capsule by mouth daily.          Allergies  Allergen Reactions  . Codeine   . Pioglitazone     REACTION: achy  . Sitagliptin Phosphate     REACTION: muscle aches  . Crestor (Rosuvastatin)     Muscle aching    Past Medical History  Diagnosis Date  . Diabetes mellitus   . Hyperlipidemia   . Glaucoma   . Migraines     Past Surgical History  Procedure Date  . Umbilical hernia repair 2002  . Cataract extraction, bilateral     Family History  Problem Relation Age of Onset  . Diabetes Mother   . Diabetes Sister   . Heart disease Brother   . Diabetes Brother   . Heart disease Brother   . Diabetes Brother   . Diabetes Brother   .  Diabetes Sister   . Diabetes Sister     History   Social History  . Marital Status: Married    Spouse Name: N/A    Number of Children: 2  . Years of Education: N/A   Occupational History  . semi retired Surveyor, quantity asst,  does Interior and spatial designer    Social History Main Topics  . Smoking status: Former Games developer  . Smokeless tobacco: Never Used  . Alcohol Use: No  . Drug Use: Not on file  . Sexually Active: Not on file   Other Topics Concern  . Not on file   Social History Narrative   No living willNo health care POA---doesn't want to consider this now (02/24/12)   Review of Systems monogamous with wife    Objective:   Physical Exam  Constitutional: He appears well-developed and well-nourished. No distress.  Skin:       Mild erythematous rash in concave areas in both inguinal regions          Assessment & Plan:

## 2012-05-10 ENCOUNTER — Encounter: Payer: Self-pay | Admitting: Internal Medicine

## 2012-05-17 ENCOUNTER — Ambulatory Visit (INDEPENDENT_AMBULATORY_CARE_PROVIDER_SITE_OTHER): Payer: BC Managed Care – PPO | Admitting: Internal Medicine

## 2012-05-17 ENCOUNTER — Encounter: Payer: Self-pay | Admitting: Internal Medicine

## 2012-05-17 VITALS — BP 148/88 | HR 59 | Wt 211.0 lb

## 2012-05-17 DIAGNOSIS — L304 Erythema intertrigo: Secondary | ICD-10-CM

## 2012-05-17 DIAGNOSIS — L291 Pruritus scroti: Secondary | ICD-10-CM

## 2012-05-17 DIAGNOSIS — L538 Other specified erythematous conditions: Secondary | ICD-10-CM

## 2012-05-17 DIAGNOSIS — L293 Anogenital pruritus, unspecified: Secondary | ICD-10-CM

## 2012-05-17 LAB — POCT URINALYSIS DIPSTICK
Bilirubin, UA: NEGATIVE
Leukocytes, UA: NEGATIVE
Nitrite, UA: NEGATIVE
Protein, UA: NEGATIVE
pH, UA: 6.5

## 2012-05-17 MED ORDER — FLUCONAZOLE 100 MG PO TABS: 100.0000 mg | ORAL_TABLET | Freq: Every day | ORAL | Status: DC

## 2012-05-17 MED ORDER — TRIAMCINOLONE ACETONIDE 0.1 % EX LOTN: TOPICAL_LOTION | Freq: Three times a day (TID) | CUTANEOUS | Status: DC | PRN

## 2012-05-17 NOTE — Progress Notes (Signed)
Subjective:    Patient ID: Alexis Sharp, male    DOB: [birth year], 66 y.o.   MRN: [id number]  HPI Still has itching in groin Did get some relief from the ketoconazole---just used along the sides Then he stopped it today since it worsened  Now "red hot" Penis is "dead"----- sensation seems decreased from normal  No urethral discharge  Current Outpatient Prescriptions on File Prior to Visit  Medication Sig Dispense Refill  . aspirin 81 MG chewable tablet Chew 81 mg by mouth daily.        Marland Kitchen atorvastatin (LIPITOR) 10 MG tablet Take 1 tablet (10 mg total) by mouth daily.  90 tablet  3  . b complex vitamins tablet Take 1 tablet by mouth daily.        Marland Kitchen CALCIUM-MAGNESUIUM-ZINC 333-133-8.3 MG TABS Take by mouth 2 (two) times daily.        . Chromium Picolinate 200 MCG TABS Take by mouth as needed.        . dorzolamide-timolol (COSOPT) 22.3-6.8 MG/ML ophthalmic solution Place 1 drop into both eyes 2 (two) times daily.       . fish oil-omega-3 fatty acids 1000 MG capsule Take 2 g by mouth daily.        Marland Kitchen ketoconazole (NIZORAL) 2 % cream Apply topically 2 (two) times daily. Till rash is cleared  60 g  1  . latanoprost (XALATAN) 0.005 % ophthalmic solution Place 1 drop into both eyes at bedtime.       . metFORMIN (GLUCOPHAGE) 500 MG tablet Take 1 tablet (500 mg total) by mouth 3 (three) times daily.  270 tablet  3  . Multiple Vitamin (MULTIVITAMIN) capsule Take 1 capsule by mouth daily.          Allergies  Allergen Reactions  . Codeine   . Pioglitazone     REACTION: achy  . Sitagliptin Phosphate     REACTION: muscle aches  . Crestor (Rosuvastatin)     Muscle aching    Past Medical History  Diagnosis Date  . Diabetes mellitus   . Hyperlipidemia   . Glaucoma   . Migraines     Past Surgical History  Procedure Date  . Umbilical hernia repair 2002  . Cataract extraction, bilateral     Family History  Problem Relation Age of Onset  . Diabetes Mother   . Diabetes Sister   .  Heart disease Brother   . Diabetes Brother   . Heart disease Brother   . Diabetes Brother   . Diabetes Brother   . Diabetes Sister   . Diabetes Sister     History   Social History  . Marital Status: Married    Spouse Name: N/A    Number of Children: 2  . Years of Education: N/A   Occupational History  . semi retired Surveyor, quantity asst,  does Interior and spatial designer    Social History Main Topics  . Smoking status: Former Games developer  . Smokeless tobacco: Never Used  . Alcohol Use: No  . Drug Use: Not on file  . Sexually Active: Not on file   Other Topics Concern  . Not on file   Social History Narrative   No living willNo health care POA---doesn't want to consider this now (02/24/12)   Review of Systems No fever No dysuria    Objective:   Physical Exam  Constitutional: He appears well-developed and well-nourished. No distress.  Genitourinary:       Redness is deep and slight  warmth in inguinal folds but now also on scrotum No sig tenderness          Assessment & Plan:

## 2012-05-17 NOTE — Patient Instructions (Signed)
Please call next week if you are not better--- I will make a referral to a dermatologist

## 2012-05-17 NOTE — Assessment & Plan Note (Signed)
Does have some heat but doesn't seem to be bacterial infection Still likely fungal but with more inflammation Will treat with fluconazole and steroid cream Dermatologist if not improving by next week

## 2012-05-18 ENCOUNTER — Encounter: Payer: Self-pay | Admitting: Internal Medicine

## 2012-06-22 ENCOUNTER — Telehealth: Payer: Self-pay

## 2012-06-22 NOTE — Telephone Encounter (Signed)
Pt received notice from [facility] to call for colonoscopy; pt does not want to have done at [facility] because [facility] has contract with some offices that pt only pays a co pay. Pt wants to know if Dr Alphonsus Sias thinks it is time for pt to have colonoscopy and  If so wants appt at [facility] in [place] [phone].Please advise.

## 2012-06-23 NOTE — Telephone Encounter (Signed)
VM has not been set up yet, will try again later.

## 2012-06-23 NOTE — Telephone Encounter (Signed)
Please call him I reviewed the colonoscopy from 2008 and there were no polyps or concerning features. The only concern was that he was not completely cleaned out. That is probably why he recommended follow up in 5 years  I think it is reasonable to wait till 2018 for another colonoscopy. We probably should set him up to do the yearly stool immunoassay to be sure there is no blood in the stool, though. If he is willing to do this, please set up with the lab

## 2012-06-24 NOTE — Telephone Encounter (Signed)
VM not set up yet, will try again later 

## 2012-07-06 NOTE — Telephone Encounter (Signed)
Spoke with patient and he will wait until he turns 67 to see if medicare will pay for the colonoscopy? Per pt he paid over $1000 for his last colonoscopy. Pt has appt with Dr.Letvak in march and will also discuss then.

## 2012-07-07 NOTE — Telephone Encounter (Signed)
After reviewing his prior colonoscopy, I think that is entirely reasonable

## 2012-07-31 ENCOUNTER — Other Ambulatory Visit: Payer: Self-pay

## 2012-08-30 ENCOUNTER — Encounter: Payer: Self-pay | Admitting: Internal Medicine

## 2012-08-30 ENCOUNTER — Ambulatory Visit (INDEPENDENT_AMBULATORY_CARE_PROVIDER_SITE_OTHER): Payer: BC Managed Care – PPO | Admitting: Internal Medicine

## 2012-08-30 VITALS — BP 128/80 | HR 60 | Temp 98.4°F | Wt 207.0 lb

## 2012-08-30 DIAGNOSIS — E119 Type 2 diabetes mellitus without complications: Secondary | ICD-10-CM

## 2012-08-30 DIAGNOSIS — G43909 Migraine, unspecified, not intractable, without status migrainosus: Secondary | ICD-10-CM

## 2012-08-30 DIAGNOSIS — E785 Hyperlipidemia, unspecified: Secondary | ICD-10-CM

## 2012-08-30 NOTE — Assessment & Plan Note (Signed)
Seems to have good control Will check A1c 

## 2012-08-30 NOTE — Assessment & Plan Note (Signed)
Rare now Ibuprofen is effective

## 2012-08-30 NOTE — Progress Notes (Signed)
Subjective:    Patient ID: Alexis Sharp, male    DOB: Sep 21, 1945, 67 y.o.   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 aspirin 81 MG chewable tablet Chew 81 mg by mouth daily.        Marland Kitchen atorvastatin (LIPITOR) 10 MG tablet Take 1 tablet (10 mg total) by mouth daily.  90 tablet  3  . b complex vitamins tablet Take 1 tablet by mouth daily.        Marland Kitchen CALCIUM-MAGNESUIUM-ZINC 333-133-8.3 MG TABS Take by mouth 2 (two) times daily.        . dorzolamide-timolol (COSOPT) 22.3-6.8 MG/ML ophthalmic solution Place 1 drop into both eyes 2 (two) times daily.       . fish oil-omega-3 fatty acids 1000 MG capsule Take 2 g by mouth daily.        Marland Kitchen latanoprost (XALATAN) 0.005 % ophthalmic solution Place 1 drop into both eyes at bedtime.       . metFORMIN (GLUCOPHAGE) 500 MG tablet Take 1 tablet (500 mg total) by mouth 3 (three) times daily.  270 tablet  3  . Multiple Vitamin (MULTIVITAMIN) capsule Take 1 capsule by mouth daily.         No current facility-administered medications on file prior to visit.    Allergies  Allergen Reactions  . Codeine   . Pioglitazone     REACTION: achy  . Sitagliptin Phosphate     REACTION: muscle aches  . Crestor (Rosuvastatin)     Muscle aching    Past Medical History  Diagnosis Date  . Diabetes mellitus   . Hyperlipidemia   . Glaucoma   . Migraines     Past Surgical History  Procedure Laterality Date  . Umbilical hernia repair  2002  . Cataract  extraction, bilateral      Family History  Problem Relation Age of Onset  . Diabetes Mother   . Diabetes Sister   . Heart disease Brother   . Diabetes Brother   . Heart disease Brother   . Diabetes Brother   . Diabetes Brother   . Diabetes Sister   . Diabetes Sister     History   Social History  . Marital Status: Married    Spouse Name: N/A    Number of Children: 2  . Years of Education: N/A   Occupational History  . semi retired Surveyor, quantity asst,  does Interior and spatial designer    Social History Main Topics  . Smoking status: Former Games developer  . Smokeless tobacco: Never Used  . Alcohol Use: No  . Drug Use: Not on file  . Sexually Active: Not on file   Other Topics Concern  . Not on file   Social History Narrative   No living will   No health care POA---doesn't want to consider this now (02/24/12)   Review of Systems Sleeps okay Appetite is fine    Objective:   Physical Exam  Constitutional: He appears well-developed and well-nourished. No distress.  Neck: Normal range  of motion. Neck supple. No thyromegaly present.  Cardiovascular: Normal rate, regular rhythm, normal heart sounds and intact distal pulses.  Exam reveals no gallop.   No murmur heard. Pulmonary/Chest: Effort normal and breath sounds normal. No respiratory distress. He has no wheezes. He has no rales.  Musculoskeletal: He exhibits no edema and no tenderness.  Lymphadenopathy:    He has no cervical adenopathy.  Skin: No rash noted.  No foot lesions  Psychiatric: He has a normal mood and affect. His behavior is normal.          Assessment & Plan:

## 2012-08-30 NOTE — Assessment & Plan Note (Signed)
No problems with the statin Lab Results  Component Value Date   LDLCALC 70 04/14/2012   No changes needed

## 2012-09-02 ENCOUNTER — Encounter: Payer: Self-pay | Admitting: *Deleted

## 2012-09-02 MED ORDER — GLIPIZIDE 5 MG PO TABS: 5.0000 mg | ORAL_TABLET | Freq: Every day | ORAL | Status: DC

## 2012-10-01 DIAGNOSIS — J209 Acute bronchitis, unspecified: Secondary | ICD-10-CM | POA: Diagnosis not present

## 2012-10-01 DIAGNOSIS — J019 Acute sinusitis, unspecified: Secondary | ICD-10-CM | POA: Diagnosis not present

## 2012-10-12 ENCOUNTER — Telehealth: Payer: Self-pay

## 2012-10-12 MED ORDER — RELION ULTIMA GLUCOSE SYSTEM W/DEVICE KIT: 1.0000 | PACK | Freq: Once | Status: DC

## 2012-10-12 MED ORDER — GLUCOSE BLOOD VI STRP: 1.0000 | ORAL_STRIP | Freq: Two times a day (BID) | Status: DC | PRN

## 2012-10-12 NOTE — Telephone Encounter (Signed)
rx sent to pharmacy by e-script Spoke with patient and advised results   

## 2012-10-12 NOTE — Telephone Encounter (Signed)
Pt called back and request relion ultima glucose monitor with test strips.Please advise.

## 2012-10-12 NOTE — Telephone Encounter (Signed)
Pt request new meter and diabetic test strips sent to [facility] [address]. Pt test blood sugar twice a day. Pt will ck with insurance co and let me know the kind of meter and strips that are most affordable.

## 2012-10-12 NOTE — Telephone Encounter (Signed)
Please send order

## 2012-10-20 DIAGNOSIS — H4010X0 Unspecified open-angle glaucoma, stage unspecified: Secondary | ICD-10-CM | POA: Diagnosis not present

## 2012-11-03 DIAGNOSIS — H4010X0 Unspecified open-angle glaucoma, stage unspecified: Secondary | ICD-10-CM | POA: Diagnosis not present

## 2012-11-26 ENCOUNTER — Other Ambulatory Visit: Payer: Self-pay | Admitting: *Deleted

## 2012-11-26 MED ORDER — GLUCOSE BLOOD VI STRP: ORAL_STRIP | Status: DC

## 2012-11-26 MED ORDER — GLUCOSE BLOOD VI STRP: 1.0000 | ORAL_STRIP | Freq: Every day | Status: DC

## 2012-11-26 NOTE — Addendum Note (Signed)
Addended by: Sueanne Margarita on: 11/26/2012 04:34 PM   Modules accepted: Orders

## 2012-12-08 ENCOUNTER — Ambulatory Visit (INDEPENDENT_AMBULATORY_CARE_PROVIDER_SITE_OTHER): Payer: Medicare Other | Admitting: Internal Medicine

## 2012-12-08 ENCOUNTER — Encounter: Payer: Self-pay | Admitting: Internal Medicine

## 2012-12-08 VITALS — BP 120/80 | HR 64 | Temp 98.0°F | Wt 212.0 lb

## 2012-12-08 DIAGNOSIS — IMO0001 Reserved for inherently not codable concepts without codable children: Secondary | ICD-10-CM

## 2012-12-08 DIAGNOSIS — F341 Dysthymic disorder: Secondary | ICD-10-CM

## 2012-12-08 NOTE — Assessment & Plan Note (Signed)
New issue for [place] Apparently ongoing for him Not major depression but still concerning for his compliance with lifestyle measures May want to consider psychologist

## 2012-12-08 NOTE — Assessment & Plan Note (Signed)
Seems to be better with the glipizide May need to still consider insulin if A1c not considerably better If improved, will wait 6 months to recheck while he concentrates on lifestyle measures

## 2012-12-08 NOTE — Progress Notes (Signed)
Subjective:    Patient ID: Alexis Sharp, male    DOB: [birth year], 67 y.o.   MRN: [id number]  HPI Here for follow up Did start glipizide Checking sugars bid -- fasting and usually before dinner (discussed going down to averaging once a day) Sugars seem better--- AM sugars still high (130-160)-- this is still better 90-125 later in day  Weight is up 5# Could be due to glipizide Has only just restarted exercise regimen  Has periods of depression---gets him out of his routine May last for weeks---unmotivated, etc Some feelings of hopelessness Then will pull out of it Has had evaluation in past---didn't take meds though  Current Outpatient Prescriptions on File Prior to Visit  Medication Sig Dispense Refill  . aspirin 81 MG chewable tablet Chew 81 mg by mouth daily.        Marland Kitchen atorvastatin (LIPITOR) 10 MG tablet Take 1 tablet (10 mg total) by mouth daily.  90 tablet  3  . b complex vitamins tablet Take 1 tablet by mouth daily.        Marland Kitchen CALCIUM-MAGNESUIUM-ZINC 333-133-8.3 MG TABS Take by mouth 2 (two) times daily.        . dorzolamide-timolol (COSOPT) 22.3-6.8 MG/ML ophthalmic solution Place 1 drop into both eyes 2 (two) times daily.       . fish oil-omega-3 fatty acids 1000 MG capsule Take 2 g by mouth daily.        Marland Kitchen glipiZIDE (GLUCOTROL) 5 MG tablet Take 1 tablet (5 mg total) by mouth daily.  90 tablet  3  . glucose blood (RELION GLUCOSE TEST STRIPS) test strip Use as instructed to test blood sugar once daily dx: 250.00  50 each  3  . latanoprost (XALATAN) 0.005 % ophthalmic solution Place 1 drop into both eyes at bedtime.       . metFORMIN (GLUCOPHAGE) 500 MG tablet Take 1 tablet (500 mg total) by mouth 3 (three) times daily.  270 tablet  3  . Multiple Vitamin (MULTIVITAMIN) capsule Take 1 capsule by mouth daily.         No current facility-administered medications on file prior to visit.    Allergies  Allergen Reactions  . Codeine   . Pioglitazone     REACTION: achy  .  Sitagliptin Phosphate     REACTION: muscle aches  . Crestor (Rosuvastatin)     Muscle aching    Past Medical History  Diagnosis Date  . Diabetes mellitus   . Hyperlipidemia   . Glaucoma   . Migraines     Past Surgical History  Procedure Laterality Date  . Umbilical hernia repair  2002  . Cataract extraction, bilateral      Family History  Problem Relation Age of Onset  . Diabetes Mother   . Diabetes Sister   . Heart disease Brother   . Diabetes Brother   . Heart disease Brother   . Diabetes Brother   . Diabetes Brother   . Diabetes Sister   . Diabetes Sister     History   Social History  . Marital Status: Married    Spouse Name: N/A    Number of Children: 2  . Years of Education: N/A   Occupational History  . semi retired Surveyor, quantity asst,  does Interior and spatial designer    Social History Main Topics  . Smoking status: Former Games developer  . Smokeless tobacco: Never Used  . Alcohol Use: No  . Drug Use: Not on file  . Sexually Active: Not  on file   Other Topics Concern  . Not on file   Social History Narrative   No living will   No health care POA---doesn't want to consider this now (02/24/12)   Review of Systems Sleeps well Tries to follow diabetic diet--knows what to do but wife doesn't help and he has to struggle with this    Objective:   Physical Exam  Constitutional: He appears well-developed and well-nourished. No distress.  Neck: Normal range of motion. Neck supple. No thyromegaly present.  Cardiovascular: Normal rate, regular rhythm, normal heart sounds and intact distal pulses.  Exam reveals no gallop.   No murmur heard. Pulmonary/Chest: Effort normal and breath sounds normal. No respiratory distress. He has no wheezes. He has no rales.  Musculoskeletal: He exhibits no edema and no tenderness.  Lymphadenopathy:    He has no cervical adenopathy.  Psychiatric: He has a normal mood and affect. His behavior is normal.          Assessment & Plan:

## 2012-12-29 ENCOUNTER — Encounter: Payer: Self-pay | Admitting: Internal Medicine

## 2013-01-19 ENCOUNTER — Other Ambulatory Visit: Payer: Self-pay

## 2013-01-27 ENCOUNTER — Other Ambulatory Visit: Payer: Self-pay | Admitting: *Deleted

## 2013-01-27 MED ORDER — GLIPIZIDE 5 MG PO TABS: 5.0000 mg | ORAL_TABLET | Freq: Every day | ORAL | Status: DC

## 2013-01-27 MED ORDER — ATORVASTATIN CALCIUM 10 MG PO TABS: 10.0000 mg | ORAL_TABLET | Freq: Every day | ORAL | Status: DC

## 2013-01-27 MED ORDER — METFORMIN HCL 500 MG PO TABS: 500.0000 mg | ORAL_TABLET | Freq: Three times a day (TID) | ORAL | Status: DC

## 2013-02-01 ENCOUNTER — Encounter: Payer: Self-pay | Admitting: Internal Medicine

## 2013-02-01 ENCOUNTER — Ambulatory Visit (INDEPENDENT_AMBULATORY_CARE_PROVIDER_SITE_OTHER): Payer: Medicare Other | Admitting: Internal Medicine

## 2013-02-01 VITALS — BP 148/80 | HR 67 | Temp 97.9°F | Wt 213.0 lb

## 2013-02-01 DIAGNOSIS — K2100 Gastro-esophageal reflux disease with esophagitis, without bleeding: Secondary | ICD-10-CM | POA: Diagnosis not present

## 2013-02-01 NOTE — Progress Notes (Signed)
Subjective:    Patient ID: Alexis Sharp, male    DOB: 1945-07-15, 67 y.o.   MRN: [id number]  HPI Having acid reflux problems Trouble swallowing and food gets stuck going down No problems with liquids  Goes back for years  Has seen GI in the past--- had EGD (2011--Dr Iftikhar) Was on nexium for a while and then stopped it when his symptoms improved Has been using omeprazole lately--doesn't seem to be helping lately Changed to non magnesium formula-- may be responding Generally would take 3 days or up to 2 weeks of the med---then stop again  Current Outpatient Prescriptions on File Prior to Visit  Medication Sig Dispense Refill  . aspirin 81 MG chewable tablet Chew 81 mg by mouth daily.        Marland Kitchen atorvastatin (LIPITOR) 10 MG tablet Take 1 tablet (10 mg total) by mouth daily.  90 tablet  1  . b complex vitamins tablet Take 1 tablet by mouth daily.        Marland Kitchen CALCIUM-MAGNESUIUM-ZINC 333-133-8.3 MG TABS Take by mouth 2 (two) times daily.        . dorzolamide-timolol (COSOPT) 22.3-6.8 MG/ML ophthalmic solution Place 1 drop into both eyes 2 (two) times daily.       . fish oil-omega-3 fatty acids 1000 MG capsule Take 2 g by mouth daily.        Marland Kitchen glipiZIDE (GLUCOTROL) 5 MG tablet Take 1 tablet (5 mg total) by mouth daily.  90 tablet  1  . glucose blood (RELION GLUCOSE TEST STRIPS) test strip Use as instructed to test blood sugar once daily dx: 250.00  50 each  3  . latanoprost (XALATAN) 0.005 % ophthalmic solution Place 1 drop into both eyes at bedtime.       . metFORMIN (GLUCOPHAGE) 500 MG tablet Take 1 tablet (500 mg total) by mouth 3 (three) times daily.  270 tablet  1  . Multiple Vitamin (MULTIVITAMIN) capsule Take 1 capsule by mouth daily.         No current facility-administered medications on file prior to visit.    Allergies  Allergen Reactions  . Codeine   . Pioglitazone     REACTION: achy  . Sitagliptin Phosphate     REACTION: muscle aches  . Crestor [Rosuvastatin]     Muscle  aching    Past Medical History  Diagnosis Date  . Diabetes mellitus   . Hyperlipidemia   . Glaucoma   . Migraines   . Reflux esophagitis     Past Surgical History  Procedure Laterality Date  . Umbilical hernia repair  2002  . Cataract extraction, bilateral      Family History  Problem Relation Age of Onset  . Diabetes Mother   . Diabetes Sister   . Heart disease Brother   . Diabetes Brother   . Heart disease Brother   . Diabetes Brother   . Diabetes Brother   . Diabetes Sister   . Diabetes Sister     History   Social History  . Marital Status: Married    Spouse Name: N/A    Number of Children: 2  . Years of Education: N/A   Occupational History  . semi retired Surveyor, quantity asst,  does Interior and spatial designer    Social History Main Topics  . Smoking status: Former Games developer  . Smokeless tobacco: Never Used  . Alcohol Use: No  . Drug Use: Not on file  . Sexual Activity: Not on file   Other  Topics Concern  . Not on file   Social History Narrative   No living will   No health care POA---doesn't want to consider this now (02/24/12)   Review of Systems Has ?stye on left eye Present for a couple of weeks Itching but no pain No discharge  Bowel are okay    Objective:   Physical Exam  Constitutional: He appears well-developed and well-nourished. No distress.  Eyes:  Slight redness under left eye No stye Not infected  Abdominal: Soft. Bowel sounds are normal. He exhibits no distension and no mass. There is no tenderness. There is no rebound and no guarding.          Assessment & Plan:

## 2013-02-01 NOTE — Patient Instructions (Signed)
Please take the omeprazole every day. If you still have swallowing problems in 6 weeks, call for referral to gastroenterologist

## 2013-02-01 NOTE — Assessment & Plan Note (Signed)
Has had recurrent problems with this over at least 3 years Inconsistent with PPI and hoped not to take it daily---now not sure he could  Discussed the disease Needs to stay on PPI If dysphagia persists at 6 weeks, will make GI referral for EGD and possible dilation

## 2013-03-03 ENCOUNTER — Encounter: Payer: BC Managed Care – PPO | Admitting: Internal Medicine

## 2013-03-03 DIAGNOSIS — J019 Acute sinusitis, unspecified: Secondary | ICD-10-CM | POA: Diagnosis not present

## 2013-03-18 DIAGNOSIS — H01119 Allergic dermatitis of unspecified eye, unspecified eyelid: Secondary | ICD-10-CM | POA: Diagnosis not present

## 2013-04-07 DIAGNOSIS — Z23 Encounter for immunization: Secondary | ICD-10-CM | POA: Diagnosis not present

## 2013-04-21 ENCOUNTER — Other Ambulatory Visit: Payer: Self-pay

## 2013-05-04 DIAGNOSIS — H4010X0 Unspecified open-angle glaucoma, stage unspecified: Secondary | ICD-10-CM | POA: Diagnosis not present

## 2013-05-16 DIAGNOSIS — H26499 Other secondary cataract, unspecified eye: Secondary | ICD-10-CM | POA: Diagnosis not present

## 2013-06-15 ENCOUNTER — Other Ambulatory Visit: Payer: Self-pay | Admitting: Internal Medicine

## 2013-06-17 DIAGNOSIS — J019 Acute sinusitis, unspecified: Secondary | ICD-10-CM | POA: Diagnosis not present

## 2013-06-17 DIAGNOSIS — J029 Acute pharyngitis, unspecified: Secondary | ICD-10-CM | POA: Diagnosis not present

## 2013-06-27 DIAGNOSIS — H26499 Other secondary cataract, unspecified eye: Secondary | ICD-10-CM | POA: Diagnosis not present

## 2013-06-27 LAB — HM DIABETES EYE EXAM

## 2013-06-29 ENCOUNTER — Encounter: Payer: Self-pay | Admitting: Internal Medicine

## 2013-06-29 ENCOUNTER — Ambulatory Visit (INDEPENDENT_AMBULATORY_CARE_PROVIDER_SITE_OTHER): Payer: Medicare Other | Admitting: Internal Medicine

## 2013-06-29 VITALS — BP 124/84 | HR 70 | Temp 98.5°F | Ht 71.25 in | Wt 215.0 lb

## 2013-06-29 DIAGNOSIS — Z0000 Encounter for general adult medical examination without abnormal findings: Secondary | ICD-10-CM

## 2013-06-29 DIAGNOSIS — Z125 Encounter for screening for malignant neoplasm of prostate: Secondary | ICD-10-CM | POA: Diagnosis not present

## 2013-06-29 DIAGNOSIS — IMO0001 Reserved for inherently not codable concepts without codable children: Secondary | ICD-10-CM

## 2013-06-29 DIAGNOSIS — E785 Hyperlipidemia, unspecified: Secondary | ICD-10-CM | POA: Diagnosis not present

## 2013-06-29 DIAGNOSIS — K2100 Gastro-esophageal reflux disease with esophagitis, without bleeding: Secondary | ICD-10-CM

## 2013-06-29 DIAGNOSIS — S335XXA Sprain of ligaments of lumbar spine, initial encounter: Secondary | ICD-10-CM | POA: Diagnosis not present

## 2013-06-29 DIAGNOSIS — R3915 Urgency of urination: Secondary | ICD-10-CM

## 2013-06-29 DIAGNOSIS — Z23 Encounter for immunization: Secondary | ICD-10-CM | POA: Diagnosis not present

## 2013-06-29 DIAGNOSIS — E1165 Type 2 diabetes mellitus with hyperglycemia: Secondary | ICD-10-CM

## 2013-06-29 LAB — HM DIABETES FOOT EXAM

## 2013-06-29 LAB — CBC WITH DIFFERENTIAL/PLATELET
Basophils Absolute: 0 10*3/uL (ref 0.0–0.1)
Basophils Relative: 0.5 % (ref 0.0–3.0)
EOS ABS: 0.1 10*3/uL (ref 0.0–0.7)
EOS PCT: 1.2 % (ref 0.0–5.0)
HEMATOCRIT: 47.4 % (ref 39.0–52.0)
HEMOGLOBIN: 16.2 g/dL (ref 13.0–17.0)
LYMPHS ABS: 2.4 10*3/uL (ref 0.7–4.0)
Lymphocytes Relative: 37.8 % (ref 12.0–46.0)
MCHC: 34.2 g/dL (ref 30.0–36.0)
MCV: 94.2 fl (ref 78.0–100.0)
MONO ABS: 0.5 10*3/uL (ref 0.1–1.0)
Monocytes Relative: 8.2 % (ref 3.0–12.0)
NEUTROS ABS: 3.3 10*3/uL (ref 1.4–7.7)
Neutrophils Relative %: 52.3 % (ref 43.0–77.0)
Platelets: 219 10*3/uL (ref 150.0–400.0)
RBC: 5.03 Mil/uL (ref 4.22–5.81)
RDW: 13.1 % (ref 11.5–14.6)
WBC: 6.2 10*3/uL (ref 4.5–10.5)

## 2013-06-29 LAB — HEPATIC FUNCTION PANEL
ALT: 35 U/L (ref 0–53)
AST: 27 U/L (ref 0–37)
Albumin: 4.8 g/dL (ref 3.5–5.2)
Alkaline Phosphatase: 56 U/L (ref 39–117)
Bilirubin, Direct: 0.2 mg/dL (ref 0.0–0.3)
TOTAL PROTEIN: 7.7 g/dL (ref 6.0–8.3)
Total Bilirubin: 1 mg/dL (ref 0.3–1.2)

## 2013-06-29 LAB — HEMOGLOBIN A1C: HEMOGLOBIN A1C: 8.6 % — AB (ref 4.6–6.5)

## 2013-06-29 LAB — LIPID PANEL
Cholesterol: 172 mg/dL (ref 0–200)
HDL: 41.8 mg/dL (ref >39.00)
LDL Cholesterol: 101 mg/dL — ABNORMAL HIGH (ref 0–99)
Total CHOL/HDL Ratio: 4
Triglycerides: 146 mg/dL (ref 0.0–149.0)
VLDL: 29.2 mg/dL (ref 0.0–40.0)

## 2013-06-29 LAB — BASIC METABOLIC PANEL
BUN: 16 mg/dL (ref 6–23)
CHLORIDE: 103 meq/L (ref 96–112)
CO2: 26 mEq/L (ref 19–32)
Calcium: 9.8 mg/dL (ref 8.4–10.5)
Creatinine, Ser: 1.1 mg/dL (ref 0.4–1.5)
GFR: 71.24 mL/min (ref >60.00)
Glucose, Bld: 104 mg/dL — ABNORMAL HIGH (ref 70–99)
POTASSIUM: 4 meq/L (ref 3.5–5.1)
Sodium: 136 mEq/L (ref 135–145)

## 2013-06-29 LAB — T4, FREE: FREE T4: 0.85 ng/dL (ref 0.60–1.60)

## 2013-06-29 LAB — PSA: PSA: 0.5 ng/mL (ref 0.10–4.00)

## 2013-06-29 LAB — TSH: TSH: 1.55 u[IU]/mL (ref 0.35–5.50)

## 2013-06-29 NOTE — Progress Notes (Signed)
Subjective:    Patient ID: Alexis Sharp, male    DOB: 11-Jan-1946, 68 y.o.   MRN: [id number]  HPI Here for Medicare wellness and follow up Reviewed form Due for PSA after discussion No falls No depression or anhedonia Tries to exercise regularly--just joined "health buddy"  Mild hearing problems Vision is okay No tobacco or alcohol No cognitive  changes  Heartburn is better Taking the omeprazole daily No swallowing problems  Checks sugars twice a week or so Tends to be higher in AM Usually 150's and occasionally higher Mild hypoglycemic reactions since on glipizide--- no neuroglycopenia No sores, numbness or pain in feet  No problems with statin No myalgias No GI problems on current dose  3 weeks of low back pain Doesn't remember an injury Right paraspinal area No leg weakness Mostly just stiff in AM  Current Outpatient Prescriptions on File Prior to Visit  Medication Sig Dispense Refill  . aspirin 81 MG chewable tablet Chew 81 mg by mouth daily.        Marland Kitchen atorvastatin (LIPITOR) 10 MG tablet TAKE 1 TABLET EVERY DAY  90 tablet  1  . b complex vitamins tablet Take 1 tablet by mouth daily.        Marland Kitchen CALCIUM-MAGNESUIUM-ZINC 333-133-8.3 MG TABS Take by mouth 2 (two) times daily.        . fish oil-omega-3 fatty acids 1000 MG capsule Take 2 g by mouth daily.        Marland Kitchen glipiZIDE (GLUCOTROL) 5 MG tablet TAKE 1 TABLET EVERY DAY  90 tablet  1  . glucose blood (RELION GLUCOSE TEST STRIPS) test strip Use as instructed to test blood sugar once daily dx: 250.00  50 each  3  . latanoprost (XALATAN) 0.005 % ophthalmic solution Place 1 drop into both eyes at bedtime.       . metFORMIN (GLUCOPHAGE) 500 MG tablet TAKE 1 TABLET THREE TIMES DAILY  270 tablet  1  . Multiple Vitamin (MULTIVITAMIN) capsule Take 1 capsule by mouth daily.        . brimonidine (ALPHAGAN) 0.15 % ophthalmic solution Place 1 drop into both eyes 2 (two) times daily.       . dorzolamide-timolol (COSOPT) 22.3-6.8 MG/ML  ophthalmic solution Place 1 drop into both eyes 2 (two) times daily.       Marland Kitchen omeprazole (PRILOSEC) 20 MG capsule Take 20 mg by mouth daily.       No current facility-administered medications on file prior to visit.    Allergies  Allergen Reactions  . Codeine   . Pioglitazone     REACTION: achy  . Sitagliptin Phosphate     REACTION: muscle aches  . Crestor [Rosuvastatin]     Muscle aching    Past Medical History  Diagnosis Date  . Diabetes mellitus   . Hyperlipidemia   . Glaucoma   . Migraines   . Reflux esophagitis     Past Surgical History  Procedure Laterality Date  . Umbilical hernia repair  2002  . Cataract extraction, bilateral      Family History  Problem Relation Age of Onset  . Diabetes Mother   . Diabetes Sister   . Heart disease Brother   . Diabetes Brother   . Heart disease Brother   . Diabetes Brother   . Diabetes Brother   . Diabetes Sister   . Diabetes Sister     History   Social History  . Marital Status: Married    Spouse Name:  N/A    Number of Children: 2  . Years of Education: N/A   Occupational History  . semi retired Museum/gallery curator asst,  does Software engineer    Social History Main Topics  . Smoking status: Former Research scientist (life sciences)  . Smokeless tobacco: Never Used  . Alcohol Use: No  . Drug Use: No  . Sexual Activity: Not on file   Other Topics Concern  . Not on file   Social History Narrative   No living will   No health care POA---okay with wife making decisions   Would accept resuscitation   Not sure about tube feeds   Review of Systems Weight is stable Sleeps well No bowel problems No voiding problems. Some urgency but no major issues     Objective:   Physical Exam  Constitutional: He is oriented to person, place, and time. He appears well-developed and well-nourished. No distress.  HENT:  Mouth/Throat: Oropharynx is clear and moist. No oropharyngeal exudate.  Neck: Normal range of motion. Neck supple. No thyromegaly  present.  Cardiovascular: Normal rate, regular rhythm, normal heart sounds and intact distal pulses.  Exam reveals no gallop.   No murmur heard. Pulmonary/Chest: Effort normal and breath sounds normal. No respiratory distress. He has no wheezes. He has no rales.  Abdominal: Soft. There is no tenderness.  Musculoskeletal: He exhibits no edema and no tenderness.  No spine tenderness SLR negative bilaterally No leg weakness  Lymphadenopathy:    He has no cervical adenopathy.  Neurological: He is alert and oriented to person, place, and time.  President-- "Elyn Peers, Shawn Stall [place]" [phone] [id number] Recall 2/3  Skin: No rash noted. No erythema.  No foot lesions  Psychiatric: He has a normal mood and affect. His behavior is normal.          Assessment & Plan:

## 2013-06-29 NOTE — Assessment & Plan Note (Signed)
Better with regular PPI

## 2013-06-29 NOTE — Addendum Note (Signed)
Addended by: Despina Hidden on: 06/29/2013 09:52 AM   Modules accepted: Orders

## 2013-06-29 NOTE — Assessment & Plan Note (Signed)
Seems muscular Discussed supportive care

## 2013-06-29 NOTE — Assessment & Plan Note (Signed)
No problems with statin 

## 2013-06-29 NOTE — Assessment & Plan Note (Signed)
High fasting sugars Discussed adding lantus if A1c back over 8%

## 2013-06-29 NOTE — Assessment & Plan Note (Signed)
I have personally reviewed the Medicare Annual Wellness questionnaire and have noted 1. The patient's medical and social history 2. Their use of alcohol, tobacco or illicit drugs 3. Their current medications and supplements 4. The patient's functional ability including ADL's, fall risks, home safety risks and hearing or visual             impairment. 5. Diet and physical activities 6. Evidence for depression or mood disorders  The patients weight, height, BMI and visual acuity have been recorded in the chart I have made referrals, counseling and provided education to the patient based review of the above and I have provided the pt with a written personalized care plan for preventive services.  I have provided you with a copy of your personalized plan for preventive services. Please take the time to review along with your updated medication list.  Will check PSA after discussion Td today

## 2013-06-29 NOTE — Patient Instructions (Signed)

## 2013-06-29 NOTE — Progress Notes (Signed)
Pre-visit discussion using our clinic review tool. No additional management support is needed unless otherwise documented below in the visit note.  

## 2013-06-30 ENCOUNTER — Telehealth: Payer: Self-pay

## 2013-06-30 NOTE — Telephone Encounter (Signed)
Relevant patient education assigned to patient using Emmi. ° °

## 2013-07-05 ENCOUNTER — Ambulatory Visit: Payer: Medicare Other | Admitting: Internal Medicine

## 2013-07-06 ENCOUNTER — Ambulatory Visit (INDEPENDENT_AMBULATORY_CARE_PROVIDER_SITE_OTHER): Payer: Medicare Other | Admitting: Internal Medicine

## 2013-07-06 ENCOUNTER — Encounter: Payer: Self-pay | Admitting: Internal Medicine

## 2013-07-06 VITALS — BP 120/80 | HR 68 | Temp 98.3°F | Wt 218.0 lb

## 2013-07-06 DIAGNOSIS — IMO0001 Reserved for inherently not codable concepts without codable children: Secondary | ICD-10-CM

## 2013-07-06 DIAGNOSIS — E1165 Type 2 diabetes mellitus with hyperglycemia: Principal | ICD-10-CM

## 2013-07-06 NOTE — Patient Instructions (Signed)
Please set up lab for 3 months (HgbA1c -- 250.02)

## 2013-07-06 NOTE — Progress Notes (Signed)
   Subjective:    Patient ID: Alexis Sharp, male    DOB: May 28, 1946, 68 y.o.   MRN: [id number]  HPI Here to discuss starting insulin A1c is up High fasting sugars  Concerned about low sugars with glipizide No serious reactions  Has started new program with exercise Buddy exercise program--- hopes to get his weight down Has improved his diet over the past week Hasn't been checking sugars regularly -but 120 in AM and 87 in afternoon  He does think he can improve his eating  Determined to lose weight --now going to gym regularly with buddy Review of Systems     Objective:   Physical Exam        Assessment & Plan:

## 2013-07-06 NOTE — Progress Notes (Signed)
Pre-visit discussion using our clinic review tool. No additional management support is needed unless otherwise documented below in the visit note.  

## 2013-07-06 NOTE — Assessment & Plan Note (Signed)
We discussed his options for about 15 minutes Has really changed his exercise regimen Trying to eat better  Showed lantus and demonstrated prefilled pen Will hold off unless not better in 3 months

## 2013-08-25 DIAGNOSIS — H4011X0 Primary open-angle glaucoma, stage unspecified: Secondary | ICD-10-CM | POA: Diagnosis not present

## 2013-09-07 ENCOUNTER — Encounter: Payer: Self-pay | Admitting: Internal Medicine

## 2013-09-08 DIAGNOSIS — H4011X0 Primary open-angle glaucoma, stage unspecified: Secondary | ICD-10-CM | POA: Diagnosis not present

## 2013-09-12 ENCOUNTER — Telehealth: Payer: Self-pay

## 2013-09-12 MED ORDER — INSULIN GLARGINE 100 UNITS/ML SOLOSTAR PEN: 10.0000 [IU] | PEN_INJECTOR | Freq: Every day | SUBCUTANEOUS | Status: DC

## 2013-09-12 MED ORDER — "INSULIN SYRINGE 29G X 1/2"" 1 ML MISC": 10.0000 [IU] | Freq: Every evening | Status: DC | PRN

## 2013-09-12 NOTE — Telephone Encounter (Signed)
Alexis Sharp with [facility] [address] left v/m; Maudie Mercury said pt had discussed with Dr Silvio Pate about getting Lantus in pen form; refill came at vial. If pt can have pen please send order for lantus pen and pen needle.Please advise.

## 2013-09-12 NOTE — Telephone Encounter (Signed)
rx sent to pharmacy by e-script Spoke with patient and advised results   

## 2013-09-13 NOTE — Telephone Encounter (Signed)
Pt's didn't specify vial or pen, please advise?

## 2013-09-14 MED ORDER — INSULIN GLARGINE 100 UNIT/ML SOLOSTAR PEN: 10.0000 [IU] | PEN_INJECTOR | Freq: Every day | SUBCUTANEOUS | Status: DC

## 2013-09-14 MED ORDER — INSULIN PEN NEEDLE 32G X 4 MM MISC: Status: DC

## 2013-09-14 NOTE — Addendum Note (Signed)
Addended by: Despina Hidden on: 09/14/2013 09:04 AM   Modules accepted: Orders, Medications

## 2013-09-14 NOTE — Telephone Encounter (Signed)
It doesn't matter to me Ask him what he prefers---- pen is easier but more money usually

## 2013-09-14 NOTE — Telephone Encounter (Signed)
Spoke with patient and advised results rx sent to pharmacy by e-script  

## 2013-09-20 DIAGNOSIS — J019 Acute sinusitis, unspecified: Secondary | ICD-10-CM | POA: Diagnosis not present

## 2013-09-28 ENCOUNTER — Other Ambulatory Visit: Payer: Self-pay | Admitting: Internal Medicine

## 2013-09-28 DIAGNOSIS — IMO0001 Reserved for inherently not codable concepts without codable children: Secondary | ICD-10-CM

## 2013-09-28 DIAGNOSIS — E1165 Type 2 diabetes mellitus with hyperglycemia: Principal | ICD-10-CM

## 2013-10-03 ENCOUNTER — Other Ambulatory Visit (INDEPENDENT_AMBULATORY_CARE_PROVIDER_SITE_OTHER): Payer: Medicare Other

## 2013-10-03 ENCOUNTER — Ambulatory Visit (INDEPENDENT_AMBULATORY_CARE_PROVIDER_SITE_OTHER): Payer: Medicare Other | Admitting: Internal Medicine

## 2013-10-03 ENCOUNTER — Encounter: Payer: Self-pay | Admitting: Internal Medicine

## 2013-10-03 VITALS — BP 132/86 | HR 65 | Temp 97.9°F | Wt 217.0 lb

## 2013-10-03 DIAGNOSIS — H6191 Disorder of right external ear, unspecified: Secondary | ICD-10-CM

## 2013-10-03 DIAGNOSIS — H6190 Disorder of external ear, unspecified, unspecified ear: Secondary | ICD-10-CM | POA: Diagnosis not present

## 2013-10-03 DIAGNOSIS — IMO0001 Reserved for inherently not codable concepts without codable children: Secondary | ICD-10-CM | POA: Diagnosis not present

## 2013-10-03 DIAGNOSIS — S8990XA Unspecified injury of unspecified lower leg, initial encounter: Secondary | ICD-10-CM

## 2013-10-03 DIAGNOSIS — L98499 Non-pressure chronic ulcer of skin of other sites with unspecified severity: Secondary | ICD-10-CM

## 2013-10-03 DIAGNOSIS — S99919A Unspecified injury of unspecified ankle, initial encounter: Secondary | ICD-10-CM

## 2013-10-03 DIAGNOSIS — L609 Nail disorder, unspecified: Secondary | ICD-10-CM

## 2013-10-03 DIAGNOSIS — L608 Other nail disorders: Secondary | ICD-10-CM | POA: Diagnosis not present

## 2013-10-03 DIAGNOSIS — S99929A Unspecified injury of unspecified foot, initial encounter: Secondary | ICD-10-CM

## 2013-10-03 DIAGNOSIS — E1165 Type 2 diabetes mellitus with hyperglycemia: Principal | ICD-10-CM

## 2013-10-03 LAB — HEMOGLOBIN A1C: Hgb A1c MFr Bld: 7.2 % — ABNORMAL HIGH (ref 4.6–6.5)

## 2013-10-03 MED ORDER — MUPIROCIN 2 % EX OINT: 1.0000 "application " | TOPICAL_OINTMENT | Freq: Two times a day (BID) | CUTANEOUS | Status: DC

## 2013-10-03 NOTE — Addendum Note (Signed)
Addended by: Lurlean Nanny on: 10/03/2013 02:37 PM   Modules accepted: Orders

## 2013-10-03 NOTE — Addendum Note (Signed)
Addended by: Lurlean Nanny on: 10/03/2013 04:36 PM   Modules accepted: Orders

## 2013-10-03 NOTE — Patient Instructions (Addendum)
Diabetes and Foot Care Diabetes may cause you to have problems because of poor blood supply (circulation) to your feet and legs. This may cause the skin on your feet to become thinner, break easier, and heal more slowly. Your skin may become dry, and the skin may peel and crack. You may also have nerve damage in your legs and feet causing decreased feeling in them. You may not notice minor injuries to your feet that could lead to infections or more serious problems. Taking care of your feet is one of the most important things you can do for yourself.  HOME CARE INSTRUCTIONS  Wear shoes at all times, even in the house. Do not go barefoot. Bare feet are easily injured.  Check your feet daily for blisters, cuts, and redness. If you cannot see the bottom of your feet, use a mirror or ask someone for help.  Wash your feet with warm water (do not use hot water) and mild soap. Then pat your feet and the areas between your toes until they are completely dry. Do not soak your feet as this can dry your skin.  Apply a moisturizing lotion or petroleum jelly (that does not contain alcohol and is unscented) to the skin on your feet and to dry, brittle toenails. Do not apply lotion between your toes.  Trim your toenails straight across. Do not dig under them or around the cuticle. File the edges of your nails with an emery board or nail file.  Do not cut corns or calluses or try to remove them with medicine.  Wear clean socks or stockings every day. Make sure they are not too tight. Do not wear knee-high stockings since they may decrease blood flow to your legs.  Wear shoes that fit properly and have enough cushioning. To break in new shoes, wear them for just a few hours a day. This prevents you from injuring your feet. Always look in your shoes before you put them on to be sure there are no objects inside.  Do not cross your legs. This may decrease the blood flow to your feet.  If you find a minor scrape,  cut, or break in the skin on your feet, keep it and the skin around it clean and dry. These areas may be cleansed with mild soap and water. Do not cleanse the area with peroxide, alcohol, or iodine.  When you remove an adhesive bandage, be sure not to damage the skin around it.  If you have a wound, look at it several times a day to make sure it is healing.  Do not use heating pads or hot water bottles. They may burn your skin. If you have lost feeling in your feet or legs, you may not know it is happening until it is too late.  Make sure your health care provider performs a complete foot exam at least annually or more often if you have foot problems. Report any cuts, sores, or bruises to your health care provider immediately. SEEK MEDICAL CARE IF:   You have an injury that is not healing.  You have cuts or breaks in the skin.  You have an ingrown nail.  You notice redness on your legs or feet.  You feel burning or tingling in your legs or feet.  You have pain or cramps in your legs and feet.  Your legs or feet are numb.  Your feet always feel cold. SEEK IMMEDIATE MEDICAL CARE IF:   There is increasing redness,   swelling, or pain in or around a wound.  There is a red line that goes up your leg.  Pus is coming from a wound.  You develop a fever or as directed by your health care provider.  You notice a bad smell coming from an ulcer or wound. Document Released: 05/30/2000 Document Revised: 02/02/2013 Document Reviewed: 11/09/2012 Exit[employer] Patient Information 2014 [employer].  

## 2013-10-03 NOTE — Progress Notes (Signed)
Subjective:    Patient ID: Alexis Sharp, male    DOB: August 06, 1945, 68 y.o.   MRN: [id number]  HPI  Pt presents to the clinic today with c/o possible infection of his right big toe. He cannot remember when he dropped anything on his foot. He has noticed some black under his toe as well. He has noticed some redness but denies swelling. He does have a history of DM 2.  Additionally, he has concerns about an area on his ears that drains. He is not sure of what it drains but does not feel like it is pus. He has not put anything on the area.  He also wants be to look at a rough area on the ball of his left foot. It has been there for some time. It does not bother him.  Review of Systems      Past Medical History  Diagnosis Date  . Diabetes mellitus   . Hyperlipidemia   . Glaucoma   . Migraines   . Reflux esophagitis     Current Outpatient Prescriptions  Medication Sig Dispense Refill  . aspirin 81 MG chewable tablet Chew 81 mg by mouth daily.        Marland Kitchen atorvastatin (LIPITOR) 10 MG tablet TAKE 1 TABLET EVERY DAY  90 tablet  1  . b complex vitamins tablet Take 1 tablet by mouth daily.        . Brinzolamide-Brimonidine [facility]) Apply to eye 2 (two) times daily.      Marland Kitchen CALCIUM-MAGNESUIUM-ZINC 333-133-8.3 MG TABS Take by mouth 2 (two) times daily.        . fish oil-omega-3 fatty acids 1000 MG capsule Take 2 g by mouth daily.        Marland Kitchen glipiZIDE (GLUCOTROL) 5 MG tablet TAKE 1 TABLET EVERY DAY  90 tablet  1  . glucose blood (RELION GLUCOSE TEST STRIPS) test strip Use as instructed to test blood sugar once daily dx: 250.00  50 each  3  . Insulin Glargine (LANTUS SOLOSTAR) 100 UNIT/ML Solostar Pen Inject 10 Units into the skin daily at 10 pm.  5 pen  1  . Insulin Pen Needle (BD PEN NEEDLE NANO U/F) 32G X 4 MM MISC Inject 10 Units as directed at bedtime as needed (dx: 250.02).  100 each  1  . latanoprost (XALATAN) 0.005 % ophthalmic solution Place 1 drop into both eyes at bedtime.       .  metFORMIN (GLUCOPHAGE) 500 MG tablet TAKE 1 TABLET THREE TIMES DAILY  270 tablet  1  . Multiple Vitamin (MULTIVITAMIN) capsule Take 1 capsule by mouth daily.        Marland Kitchen omeprazole (PRILOSEC) 20 MG capsule Take 20 mg by mouth daily.       No current facility-administered medications for this visit.    Allergies  Allergen Reactions  . Codeine   . Pioglitazone     REACTION: achy  . Sitagliptin Phosphate     REACTION: muscle aches  . Crestor [Rosuvastatin]     Muscle aching    Family History  Problem Relation Age of Onset  . Diabetes Mother   . Diabetes Sister   . Heart disease Brother   . Diabetes Brother   . Heart disease Brother   . Diabetes Brother   . Diabetes Brother   . Diabetes Sister   . Diabetes Sister     History   Social History  . Marital Status: Married    Spouse Name:  N/A    Number of Children: 2  . Years of Education: N/A   Occupational History  . semi retired Museum/gallery curator asst,  does Software engineer    Social History Main Topics  . Smoking status: Former Research scientist (life sciences)  . Smokeless tobacco: Never Used  . Alcohol Use: No  . Drug Use: No  . Sexual Activity: Not on file   Other Topics Concern  . Not on file   Social History Narrative   No living will   No health care POA---okay with wife making decisions   Would accept resuscitation   Not sure about tube feeds     Constitutional: Denies fever, malaise, fatigue, headache or abrupt weight changes.  Skin: Pt reports redness of right big toe. Pt reports rough patch of ball of left foot. Pt reports draining lesion on right ear.   No other specific complaints in a complete review of systems (except as listed in HPI above).  Objective:   Physical Exam   BP 132/86  Pulse 65  Temp(Src) 97.9 F (36.6 C) (Oral)  Wt 217 lb (98.431 kg)  SpO2 97% Wt Readings from Last 3 Encounters:  10/03/13 217 lb (98.431 kg)  07/06/13 218 lb (98.884 kg)  06/29/13 215 lb (97.523 kg)    General: Appears his  stated age, well developed, well nourished in NAD. Skin: Warm, dry and intact. Small break in the right big toenail with some mild erythema around the nail bed. No infection noted. No lesion noted on left or right ear. Large callous noted on ball of left foot. Cardiovascular: Normal rate and rhythm. S1,S2 noted.  No murmur, rubs or gallops noted. No JVD or BLE edema. No carotid bruits noted. Pulmonary/Chest: Normal effort and positive vesicular breath sounds. No respiratory distress. No wheezes, rales or ronchi noted.     BMET    Component Value Date/Time   NA 136 06/29/2013 0908   K 4.0 06/29/2013 0908   CL 103 06/29/2013 0908   CO2 26 06/29/2013 0908   GLUCOSE 104* 06/29/2013 0908   BUN 16 06/29/2013 0908   CREATININE 1.1 06/29/2013 0908   CALCIUM 9.8 06/29/2013 0908   GFRNONAA 81 11/01/2007 1536   GFRAA 98 11/01/2007 1536    Lipid Panel     Component Value Date/Time   CHOL 172 06/29/2013 0908   TRIG 146.0 06/29/2013 0908   HDL 41.80 06/29/2013 0908   CHOLHDL 4 06/29/2013 0908   VLDL 29.2 06/29/2013 0908   LDLCALC 101* 06/29/2013 0908    CBC    Component Value Date/Time   WBC 6.2 06/29/2013 0908   RBC 5.03 06/29/2013 0908   HGB 16.2 06/29/2013 0908   HCT 47.4 06/29/2013 0908   PLT 219.0 06/29/2013 0908   MCV 94.2 06/29/2013 0908   MCHC 34.2 06/29/2013 0908   RDW 13.1 06/29/2013 0908   LYMPHSABS 2.4 06/29/2013 0908   MONOABS 0.5 06/29/2013 0908   EOSABS 0.1 06/29/2013 0908   BASOSABS 0.0 06/29/2013 0908    Hgb A1C Lab Results  Component Value Date   HGBA1C 8.6* 06/29/2013        Assessment & Plan:   Trauma to left big toe:  Break in the nail bed No infection noted RX for mupirocin oint as directed OK to soak in Epsom salt May need to see a podiatrist to trim off the broken part of the nail  Callous, left foot:  If you see the podiatrist, he can shave this down for you  Lesion of right earlobe:  I do not see any abnormality Reassurance given RTC as needed

## 2013-10-03 NOTE — Progress Notes (Signed)
Pre visit review using our clinic review tool, if applicable. No additional management support is needed unless otherwise documented below in the visit note. 

## 2013-10-04 DIAGNOSIS — H4011X0 Primary open-angle glaucoma, stage unspecified: Secondary | ICD-10-CM | POA: Diagnosis not present

## 2013-10-04 DIAGNOSIS — H1010 Acute atopic conjunctivitis, unspecified eye: Secondary | ICD-10-CM | POA: Diagnosis not present

## 2013-10-05 ENCOUNTER — Other Ambulatory Visit: Payer: Medicare Other

## 2013-11-10 ENCOUNTER — Encounter: Payer: Self-pay | Admitting: Internal Medicine

## 2013-11-10 MED ORDER — INSULIN GLARGINE 100 UNIT/ML SOLOSTAR PEN: 24.0000 [IU] | PEN_INJECTOR | Freq: Every day | SUBCUTANEOUS | Status: DC

## 2013-11-10 NOTE — Telephone Encounter (Signed)
Ok to increase to 24 units?

## 2013-11-10 NOTE — Telephone Encounter (Signed)
Please change Rx to 24 units daily He will need 800 units per month (whatever that would be in vials) Okay to refill for a year

## 2013-11-10 NOTE — Telephone Encounter (Signed)
rx sent to pharmacy by e-script  

## 2013-11-11 DIAGNOSIS — H4011X0 Primary open-angle glaucoma, stage unspecified: Secondary | ICD-10-CM | POA: Diagnosis not present

## 2013-11-11 DIAGNOSIS — H0230 Blepharochalasis unspecified eye, unspecified eyelid: Secondary | ICD-10-CM | POA: Diagnosis not present

## 2013-11-23 ENCOUNTER — Other Ambulatory Visit: Payer: Self-pay | Admitting: Internal Medicine

## 2013-12-02 ENCOUNTER — Telehealth: Payer: Self-pay | Admitting: Internal Medicine

## 2013-12-02 DIAGNOSIS — L989 Disorder of the skin and subcutaneous tissue, unspecified: Secondary | ICD-10-CM

## 2013-12-02 DIAGNOSIS — H4011X0 Primary open-angle glaucoma, stage unspecified: Secondary | ICD-10-CM | POA: Diagnosis not present

## 2013-12-02 NOTE — Telephone Encounter (Signed)
Pt called wanting to get a referral to [facility] Appointment 01/31/14   Pt has medicare and [employer] insurance  [facility] told pt spouse that he should call his dr and get a referral

## 2013-12-05 NOTE — Telephone Encounter (Signed)
Please check with him If it is not something that needs to be cut out, it might be better to start here If he feels strongly about seeing a dermatologist, I will make the referral

## 2013-12-05 NOTE — Telephone Encounter (Signed)
Should pt make an appointment here first? Pt states he has some areas on his face he wants them to look at. Pt doesn't know which kind of medicare he has??

## 2013-12-06 NOTE — Telephone Encounter (Signed)
Patient wants referral to dermatology

## 2013-12-23 DIAGNOSIS — H0230 Blepharochalasis unspecified eye, unspecified eyelid: Secondary | ICD-10-CM | POA: Diagnosis not present

## 2013-12-23 DIAGNOSIS — H4011X0 Primary open-angle glaucoma, stage unspecified: Secondary | ICD-10-CM | POA: Diagnosis not present

## 2013-12-24 ENCOUNTER — Encounter: Payer: Self-pay | Admitting: Internal Medicine

## 2013-12-26 ENCOUNTER — Other Ambulatory Visit: Payer: Self-pay | Admitting: *Deleted

## 2013-12-26 MED ORDER — INSULIN PEN NEEDLE 32G X 4 MM MISC: Status: DC

## 2013-12-26 MED ORDER — GLUCOSE BLOOD VI STRP: ORAL_STRIP | Status: DC

## 2014-01-04 ENCOUNTER — Ambulatory Visit (INDEPENDENT_AMBULATORY_CARE_PROVIDER_SITE_OTHER): Payer: Medicare Other | Admitting: Internal Medicine

## 2014-01-04 ENCOUNTER — Encounter: Payer: Self-pay | Admitting: Internal Medicine

## 2014-01-04 VITALS — BP 112/70 | HR 60 | Temp 98.6°F | Wt 214.0 lb

## 2014-01-04 DIAGNOSIS — K2100 Gastro-esophageal reflux disease with esophagitis, without bleeding: Secondary | ICD-10-CM

## 2014-01-04 DIAGNOSIS — E785 Hyperlipidemia, unspecified: Secondary | ICD-10-CM

## 2014-01-04 DIAGNOSIS — E119 Type 2 diabetes mellitus without complications: Secondary | ICD-10-CM | POA: Diagnosis not present

## 2014-01-04 LAB — HEMOGLOBIN A1C: Hgb A1c MFr Bld: 6.6 % — ABNORMAL HIGH (ref 4.6–6.5)

## 2014-01-04 LAB — MICROALBUMIN / CREATININE URINE RATIO
CREATININE, U: 77.4 mg/dL
Microalb Creat Ratio: 0.1 mg/g (ref 0.0–30.0)
Microalb, Ur: 0.1 mg/dL (ref 0.0–1.9)

## 2014-01-04 NOTE — Assessment & Plan Note (Signed)
Discussed LDL goals-- he does not want to increase the dose he is on

## 2014-01-04 NOTE — Progress Notes (Signed)
Pre visit review using our clinic review tool, if applicable. No additional management support is needed unless otherwise documented below in the visit note. 

## 2014-01-04 NOTE — Assessment & Plan Note (Signed)
Much better on the insulin Will recheck Will check urine microal

## 2014-01-04 NOTE — Assessment & Plan Note (Signed)
Controlled with the PPI 

## 2014-01-04 NOTE — Progress Notes (Signed)
Subjective:    Patient ID: Alexis Sharp, male    DOB: 07-Apr-1946, 68 y.o.   MRN: [id number]  HPI Doing well No problems with the insulin Has titrated up to 24 units now Fasting sugars 88-140 (mostly around 100) May feel it slightly if under 100 but no major hypoglycemic spells  Has kept up with exercise program Working on eating  Not excited about the statin Discussed his LDL level He doesn't want it increased  No chest pain No SOB  No heartburn or swallowing problems Takes omeprazole daily or occasionally skips doses  Current Outpatient Prescriptions on File Prior to Visit  Medication Sig Dispense Refill  . aspirin 81 MG chewable tablet Chew 81 mg by mouth daily.        Marland Kitchen atorvastatin (LIPITOR) 10 MG tablet TAKE 1 TABLET EVERY DAY  90 tablet  1  . b complex vitamins tablet Take 1 tablet by mouth daily.        . bimatoprost (LUMIGAN) 0.01 % SOLN 1 drop at bedtime.      Marland Kitchen CALCIUM-MAGNESUIUM-ZINC 333-133-8.3 MG TABS Take by mouth 2 (two) times daily.        . fish oil-omega-3 fatty acids 1000 MG capsule Take 2 g by mouth daily.        Marland Kitchen glipiZIDE (GLUCOTROL) 5 MG tablet TAKE 1 TABLET EVERY DAY  90 tablet  1  . glucose blood (RELION PRIME TEST) test strip Use as instructed to test blood sugar once daily dx: 250.00  100 each  3  . Insulin Glargine (LANTUS SOLOSTAR) 100 UNIT/ML Solostar Pen Inject 24 Units into the skin daily at 10 pm.  10 pen  11  . metFORMIN (GLUCOPHAGE) 500 MG tablet TAKE 1 TABLET THREE TIMES DAILY  270 tablet  1  . Multiple Vitamin (MULTIVITAMIN) capsule Take 1 capsule by mouth daily.        Marland Kitchen omeprazole (PRILOSEC) 20 MG capsule Take 20 mg by mouth daily.       No current facility-administered medications on file prior to visit.    Allergies  Allergen Reactions  . Codeine   . Pioglitazone     REACTION: achy  . Sitagliptin Phosphate     REACTION: muscle aches  . Crestor [Rosuvastatin]     Muscle aching    Past Medical History  Diagnosis Date  .  Diabetes mellitus   . Hyperlipidemia   . Glaucoma   . Migraines   . Reflux esophagitis     Past Surgical History  Procedure Laterality Date  . Umbilical hernia repair  2002  . Cataract extraction, bilateral      Family History  Problem Relation Age of Onset  . Diabetes Mother   . Diabetes Sister   . Heart disease Brother   . Diabetes Brother   . Heart disease Brother   . Diabetes Brother   . Diabetes Brother   . Diabetes Sister   . Diabetes Sister     History   Social History  . Marital Status: Married    Spouse Name: N/A    Number of Children: 2  . Years of Education: N/A   Occupational History  . semi retired Museum/gallery curator asst,  does Software engineer    Social History Main Topics  . Smoking status: Former Research scientist (life sciences)  . Smokeless tobacco: Never Used  . Alcohol Use: No  . Drug Use: No  . Sexual Activity: Not on file   Other Topics Concern  . Not on  file   Social History Narrative   No living will   No health care POA---okay with wife making decisions   Would accept resuscitation   Not sure about tube feeds   Review of Systems Weight is stable from January--actually down 3# since April Sleeps great    Objective:   Physical Exam  Constitutional: He appears well-developed and well-nourished. No distress.  Neck: Normal range of motion. Neck supple. No thyromegaly present.  Cardiovascular: Normal rate, regular rhythm, normal heart sounds and intact distal pulses.  Exam reveals no gallop.   No murmur heard. Pulmonary/Chest: Effort normal and breath sounds normal. No respiratory distress. He has no wheezes. He has no rales.  Musculoskeletal: He exhibits no edema and no tenderness.  Lymphadenopathy:    He has no cervical adenopathy.  Neurological:  Normal sensation in feet  Skin: No rash noted. No erythema.  No foot lesions  Psychiatric: He has a normal mood and affect. His behavior is normal.          Assessment & Plan:

## 2014-01-09 ENCOUNTER — Other Ambulatory Visit: Payer: Self-pay | Admitting: *Deleted

## 2014-01-09 MED ORDER — INSULIN GLARGINE 100 UNIT/ML SOLOSTAR PEN: 24.0000 [IU] | PEN_INJECTOR | Freq: Every day | SUBCUTANEOUS | Status: DC

## 2014-01-09 MED ORDER — INSULIN PEN NEEDLE 32G X 4 MM MISC: Status: DC

## 2014-01-16 LAB — HM DIABETES EYE EXAM

## 2014-01-31 DIAGNOSIS — L578 Other skin changes due to chronic exposure to nonionizing radiation: Secondary | ICD-10-CM | POA: Diagnosis not present

## 2014-01-31 DIAGNOSIS — L570 Actinic keratosis: Secondary | ICD-10-CM | POA: Diagnosis not present

## 2014-01-31 DIAGNOSIS — D2330 Other benign neoplasm of skin of unspecified part of face: Secondary | ICD-10-CM | POA: Diagnosis not present

## 2014-01-31 DIAGNOSIS — D239 Other benign neoplasm of skin, unspecified: Secondary | ICD-10-CM | POA: Diagnosis not present

## 2014-01-31 DIAGNOSIS — L821 Other seborrheic keratosis: Secondary | ICD-10-CM | POA: Diagnosis not present

## 2014-01-31 DIAGNOSIS — L820 Inflamed seborrheic keratosis: Secondary | ICD-10-CM | POA: Diagnosis not present

## 2014-03-10 DIAGNOSIS — J069 Acute upper respiratory infection, unspecified: Secondary | ICD-10-CM | POA: Diagnosis not present

## 2014-03-10 DIAGNOSIS — H6690 Otitis media, unspecified, unspecified ear: Secondary | ICD-10-CM | POA: Diagnosis not present

## 2014-03-17 DIAGNOSIS — H4011X2 Primary open-angle glaucoma, moderate stage: Secondary | ICD-10-CM | POA: Diagnosis not present

## 2014-03-17 DIAGNOSIS — H0231 Blepharochalasis right upper eyelid: Secondary | ICD-10-CM | POA: Diagnosis not present

## 2014-03-17 DIAGNOSIS — H0235 Blepharochalasis left lower eyelid: Secondary | ICD-10-CM | POA: Diagnosis not present

## 2014-03-21 DIAGNOSIS — Z23 Encounter for immunization: Secondary | ICD-10-CM | POA: Diagnosis not present

## 2014-03-28 ENCOUNTER — Telehealth: Payer: Self-pay | Admitting: Internal Medicine

## 2014-03-28 NOTE — Telephone Encounter (Signed)
Patient Information:  Caller Name: Sylvia  Phone: [phone]  Patient: Alexis Sharp, Alexis Sharp  Gender: Male  DOB: [birth year]  Age: 68 Years  PCP: Viviana Simpler [facility])  Office Follow Up:  Does the office need to follow up with this patient?: No  Instructions For The Office: N/A  RN Note:  Appt scheduled 03/29/14 at 12:15 with Dr. Silvio Pate Care advice and call back parameters reviewed. understanding expressed.  Symptoms  Reason For Call & Symptoms: "I stepped on nail" . Onset 1 hour ago.  Went through shoe and punctured right foot.   Cleansed with Alcohol. Patient is diabetic.  Last Tetanus January  2015.  Reviewed Health History In EMR: Yes  Reviewed Medications In EMR: Yes  Reviewed Allergies In EMR: Yes  Reviewed Surgeries / Procedures: Yes  Date of Onset of Symptoms: 03/28/2014  Treatments Tried: Alcohol  Treatments Tried Worked: No  Guideline(s) Used:  Puncture Wound  Disposition Per Guideline:   See Today or Tomorrow in Office  Reason For Disposition Reached:   Puncture wound on foot and patient has diabetes mellitus  Advice Given:  Antibiotic Ointment:  Apply an antibiotic ointment and a Band-Aid to reduce the risk of infection. Re-soak the area and reapply an antibiotic ointment every 12 hours for 2 days.  Pain Medicines:  For pain relief, you can take either acetaminophen, ibuprofen, or naproxen.  They are over-the-counter (OTC) pain drugs. You can buy them at the drugstore.  Acetaminophen (e.g., Tylenol):  Regular Strength Tylenol: Take 650 mg (two 325 mg pills) by mouth every 4-6 hours as needed. Each Regular Strength Tylenol pill has 325 mg of acetaminophen.  Extra Strength Tylenol: Take 1,000 mg (two 500 mg pills) every 8 hours as needed. Each Extra Strength Tylenol pill has 500 mg of acetaminophen.  The most you should take each day is 3,000 mg (10 Regular Strength or 6 Extra Strength pills a day).  Call Back If:  Dirt is still present in the wound  after scrubbing  It begins to look infected (redness, red streaks, tenderness, pus, fever)  Pain becomes severe  You become worse.  Expected Course:  Puncture wounds seal over in 1 to 2 hours. Pain should resolve within 2 days.  Patient Will Follow Care Advice:  YES  Appointment Scheduled:  03/29/2014 12:15:00 Appointment Scheduled Provider:  Viviana Simpler [facility])

## 2014-03-29 ENCOUNTER — Encounter: Payer: Self-pay | Admitting: Internal Medicine

## 2014-03-29 ENCOUNTER — Ambulatory Visit (INDEPENDENT_AMBULATORY_CARE_PROVIDER_SITE_OTHER): Payer: Medicare Other | Admitting: Internal Medicine

## 2014-03-29 VITALS — BP 122/80 | HR 80 | Temp 98.4°F | Wt 218.8 lb

## 2014-03-29 DIAGNOSIS — S91331A Puncture wound without foreign body, right foot, initial encounter: Secondary | ICD-10-CM

## 2014-03-29 NOTE — Progress Notes (Signed)
Subjective:    Patient ID: Alexis Sharp, male    DOB: 21-Nov-1945, 68 y.o.   MRN: [id number]  HPI Was sawing up some boards -- had been outside and he was getting rid of them Occurred around noon yesterday  Step on a board with a nail Wearing Crocs--with socks He thinks it was very superficial-- there was a small hole and some blood in foot Not much pain  He washed it with alcohol and then soap and hot water Triple antibiotic topically No symptoms now  Current Outpatient Prescriptions on File Prior to Visit  Medication Sig Dispense Refill  . aspirin 81 MG chewable tablet Chew 81 mg by mouth daily.        Marland Kitchen atorvastatin (LIPITOR) 10 MG tablet TAKE 1 TABLET EVERY DAY  90 tablet  1  . b complex vitamins tablet Take 1 tablet by mouth daily.        . bimatoprost (LUMIGAN) 0.01 % SOLN 1 drop at bedtime.      Marland Kitchen CALCIUM-MAGNESUIUM-ZINC 333-133-8.3 MG TABS Take by mouth 2 (two) times daily.        . fish oil-omega-3 fatty acids 1000 MG capsule Take 2 g by mouth daily.        Marland Kitchen glipiZIDE (GLUCOTROL) 5 MG tablet TAKE 1 TABLET EVERY DAY  90 tablet  1  . glucose blood (RELION PRIME TEST) test strip Use as instructed to test blood sugar once daily dx: 250.00  100 each  3  . Insulin Glargine (LANTUS SOLOSTAR) 100 UNIT/ML Solostar Pen Inject 24 Units into the skin daily at 10 pm.  20 pen  1  . Insulin Pen Needle 32G X 4 MM MISC Use as directed at bedtime as needed (dx: 250.02).  200 each  1  . metFORMIN (GLUCOPHAGE) 500 MG tablet TAKE 1 TABLET THREE TIMES DAILY  270 tablet  1  . Multiple Vitamin (MULTIVITAMIN) capsule Take 1 capsule by mouth daily.        Marland Kitchen omeprazole (PRILOSEC) 20 MG capsule Take 20 mg by mouth daily.       No current facility-administered medications on file prior to visit.    Allergies  Allergen Reactions  . Codeine   . Pioglitazone     REACTION: achy  . Sitagliptin Phosphate     REACTION: muscle aches  . Crestor [Rosuvastatin]     Muscle aching    Past Medical  History  Diagnosis Date  . Diabetes mellitus   . Hyperlipidemia   . Glaucoma   . Migraines   . Reflux esophagitis     Past Surgical History  Procedure Laterality Date  . Umbilical hernia repair  2002  . Cataract extraction, bilateral      Family History  Problem Relation Age of Onset  . Diabetes Mother   . Diabetes Sister   . Heart disease Brother   . Diabetes Brother   . Heart disease Brother   . Diabetes Brother   . Diabetes Brother   . Diabetes Sister   . Diabetes Sister     History   Social History  . Marital Status: Married    Spouse Name: N/A    Number of Children: 2  . Years of Education: N/A   Occupational History  . semi retired Museum/gallery curator asst,  does Software engineer    Social History Main Topics  . Smoking status: Former Research scientist (life sciences)  . Smokeless tobacco: Never Used  . Alcohol Use: No  . Drug Use: No  .  Sexual Activity: Not on file   Other Topics Concern  . Not on file   Social History Narrative   No living will   No health care POA---okay with wife making decisions   Would accept resuscitation   Not sure about tube feeds   Review of Systems No fever Feels fine     Objective:   Physical Exam  Constitutional: He appears well-developed and well-nourished. No distress.  Skin:  Puncture site along lateral right plantar foot--below ankle. Small red spot but no open area No surrounding redness, warmth or tenderness          Assessment & Plan:

## 2014-03-29 NOTE — Telephone Encounter (Signed)
Will evaluate at OV 

## 2014-03-29 NOTE — Assessment & Plan Note (Signed)
Does appear to be superficial UTD on tetanus Doesn't appear that antibiotics are needed but he will monitor it closely

## 2014-03-29 NOTE — Progress Notes (Signed)
Pre visit review using our clinic review tool, if applicable. No additional management support is needed unless otherwise documented below in the visit note. 

## 2014-05-24 ENCOUNTER — Encounter: Payer: Self-pay | Admitting: Internal Medicine

## 2014-05-24 ENCOUNTER — Ambulatory Visit (INDEPENDENT_AMBULATORY_CARE_PROVIDER_SITE_OTHER): Payer: Medicare Other | Admitting: Internal Medicine

## 2014-05-24 VITALS — BP 148/70 | HR 70 | Temp 97.9°F | Wt 220.0 lb

## 2014-05-24 DIAGNOSIS — R300 Dysuria: Secondary | ICD-10-CM

## 2014-05-24 DIAGNOSIS — R3915 Urgency of urination: Secondary | ICD-10-CM | POA: Diagnosis not present

## 2014-05-24 LAB — POCT URINALYSIS DIPSTICK
BILIRUBIN UA: NEGATIVE
Blood, UA: NEGATIVE
Glucose, UA: NEGATIVE
KETONES UA: NEGATIVE
Leukocytes, UA: NEGATIVE
Nitrite, UA: NEGATIVE
PROTEIN UA: NEGATIVE
SPEC GRAV UA: 1.02
Urobilinogen, UA: NEGATIVE
pH, UA: 7

## 2014-05-24 MED ORDER — DOXYCYCLINE HYCLATE 100 MG PO TABS: 100.0000 mg | ORAL_TABLET | Freq: Two times a day (BID) | ORAL | Status: DC

## 2014-05-24 NOTE — Progress Notes (Signed)
Subjective:    Patient ID: Alexis Sharp, male    DOB: 1946-03-17, 68 y.o.   MRN: [id number]  HPI Here for evaluation of urinary problems  Has noted some urgency constantly over past 3 days Has lost "sensation in penis" Some burning at tip of penis--with the urgency No blood in urine Normal stream Doesn't drink much water  Last intercourse about 10 days ago Couldn't keep erection---this is new problem Has been monogamous  Wife does have recurrent UTI---seems post coital  Current Outpatient Prescriptions on File Prior to Visit  Medication Sig Dispense Refill  . aspirin 81 MG chewable tablet Chew 81 mg by mouth daily.      Marland Kitchen atorvastatin (LIPITOR) 10 MG tablet TAKE 1 TABLET EVERY DAY 90 tablet 1  . b complex vitamins tablet Take 1 tablet by mouth daily.      . bimatoprost (LUMIGAN) 0.01 % SOLN 1 drop at bedtime.    Marland Kitchen CALCIUM-MAGNESUIUM-ZINC 333-133-8.3 MG TABS Take by mouth 2 (two) times daily.      . fish oil-omega-3 fatty acids 1000 MG capsule Take 2 g by mouth daily.      Marland Kitchen glipiZIDE (GLUCOTROL) 5 MG tablet TAKE 1 TABLET EVERY DAY 90 tablet 1  . glucose blood (RELION PRIME TEST) test strip Use as instructed to test blood sugar once daily dx: 250.00 100 each 3  . Insulin Glargine (LANTUS SOLOSTAR) 100 UNIT/ML Solostar Pen Inject 24 Units into the skin daily at 10 pm. 20 pen 1  . Insulin Pen Needle 32G X 4 MM MISC Use as directed at bedtime as needed (dx: 250.02). 200 each 1  . metFORMIN (GLUCOPHAGE) 500 MG tablet TAKE 1 TABLET THREE TIMES DAILY 270 tablet 1  . Multiple Vitamin (MULTIVITAMIN) capsule Take 1 capsule by mouth daily.      Marland Kitchen omeprazole (PRILOSEC) 20 MG capsule Take 20 mg by mouth daily.     No current facility-administered medications on file prior to visit.    Allergies  Allergen Reactions  . Codeine   . Pioglitazone     REACTION: achy  . Sitagliptin Phosphate     REACTION: muscle aches  . Crestor [Rosuvastatin]     Muscle aching    Past Medical  History  Diagnosis Date  . Diabetes mellitus   . Hyperlipidemia   . Glaucoma   . Migraines   . Reflux esophagitis     Past Surgical History  Procedure Laterality Date  . Umbilical hernia repair  2002  . Cataract extraction, bilateral      Family History  Problem Relation Age of Onset  . Diabetes Mother   . Diabetes Sister   . Heart disease Brother   . Diabetes Brother   . Heart disease Brother   . Diabetes Brother   . Diabetes Brother   . Diabetes Sister   . Diabetes Sister     History   Social History  . Marital Status: Married    Spouse Name: N/A    Number of Children: 2  . Years of Education: N/A   Occupational History  . semi retired Museum/gallery curator asst,  does Software engineer    Social History Main Topics  . Smoking status: Former Research scientist (life sciences)  . Smokeless tobacco: Never Used  . Alcohol Use: No  . Drug Use: No  . Sexual Activity: Not on file   Other Topics Concern  . Not on file   Social History Narrative   No living will   No health  care POA---okay with wife making decisions   Would accept resuscitation   Not sure about tube feeds   Review of Systems No fever No regular back pain Bowels have been okay Appetite is fine No N/V     Objective:   Physical Exam  Constitutional: He appears well-developed and well-nourished. No distress.  Genitourinary:  Slight redness at tip of urethra but not necessarily inflamed Scrotum and testes are non swollen or tender Prostate is normal size--no nodules, bogginess or tenderness          Assessment & Plan:

## 2014-05-24 NOTE — Assessment & Plan Note (Signed)
Urinalysis benign so not UTI No clear urethritis (and monogamous) or scrotal findings Prostate not tender  Unclear cause Will try course of doxy Consider urology eval if persists

## 2014-05-24 NOTE — Patient Instructions (Signed)
Please call if your symptoms aren't better in the next week or so. I can set you up to see a urologist.

## 2014-05-24 NOTE — Progress Notes (Signed)
Pre visit review using our clinic review tool, if applicable. No additional management support is needed unless otherwise documented below in the visit note. 

## 2014-05-25 ENCOUNTER — Other Ambulatory Visit: Payer: Self-pay | Admitting: *Deleted

## 2014-05-25 MED ORDER — GLUCOSE BLOOD VI STRP: ORAL_STRIP | Status: DC

## 2014-06-23 DIAGNOSIS — J069 Acute upper respiratory infection, unspecified: Secondary | ICD-10-CM | POA: Diagnosis not present

## 2014-07-12 ENCOUNTER — Other Ambulatory Visit: Payer: Self-pay | Admitting: Internal Medicine

## 2014-07-19 ENCOUNTER — Encounter: Payer: Medicare Other | Admitting: Internal Medicine

## 2014-07-19 DIAGNOSIS — H0231 Blepharochalasis right upper eyelid: Secondary | ICD-10-CM | POA: Diagnosis not present

## 2014-07-19 DIAGNOSIS — H04123 Dry eye syndrome of bilateral lacrimal glands: Secondary | ICD-10-CM | POA: Diagnosis not present

## 2014-07-19 DIAGNOSIS — H0234 Blepharochalasis left upper eyelid: Secondary | ICD-10-CM | POA: Diagnosis not present

## 2014-07-19 DIAGNOSIS — H4011X2 Primary open-angle glaucoma, moderate stage: Secondary | ICD-10-CM | POA: Diagnosis not present

## 2014-07-20 ENCOUNTER — Encounter: Payer: Self-pay | Admitting: Internal Medicine

## 2014-07-20 ENCOUNTER — Ambulatory Visit (INDEPENDENT_AMBULATORY_CARE_PROVIDER_SITE_OTHER): Payer: Medicare Other | Admitting: Internal Medicine

## 2014-07-20 VITALS — BP 136/80 | HR 72 | Temp 98.4°F | Ht 71.0 in | Wt 221.8 lb

## 2014-07-20 DIAGNOSIS — Z7189 Other specified counseling: Secondary | ICD-10-CM | POA: Diagnosis not present

## 2014-07-20 DIAGNOSIS — K2100 Gastro-esophageal reflux disease with esophagitis, without bleeding: Secondary | ICD-10-CM

## 2014-07-20 DIAGNOSIS — E119 Type 2 diabetes mellitus without complications: Secondary | ICD-10-CM

## 2014-07-20 DIAGNOSIS — Z0000 Encounter for general adult medical examination without abnormal findings: Secondary | ICD-10-CM

## 2014-07-20 DIAGNOSIS — E785 Hyperlipidemia, unspecified: Secondary | ICD-10-CM

## 2014-07-20 DIAGNOSIS — Z23 Encounter for immunization: Secondary | ICD-10-CM

## 2014-07-20 HISTORY — DX: Gastro-esophageal reflux disease with esophagitis, without bleeding: K21.00

## 2014-07-20 LAB — HM DIABETES FOOT EXAM

## 2014-07-20 NOTE — Progress Notes (Signed)
Pre visit review using our clinic review tool, if applicable. No additional management support is needed unless otherwise documented below in the visit note. 

## 2014-07-20 NOTE — Assessment & Plan Note (Addendum)
Seems to have good control Will check labs BP fine--will check urine microal

## 2014-07-20 NOTE — Progress Notes (Signed)
Subjective:    Patient ID: Alexis Sharp, male    DOB: 1945/08/05, 69 y.o.   MRN: [id number]  HPI Here for Medicare wellness and follow up of diabetes and chronic medical conditions Reviewed form and advanced directives Reviewed other physicians No falls No depression or anhedonia Hearing is not great---wears hearing aides intermittently Vision is okay  Checking sugars daily Still running very good---rare over 150. Usually 115-130 Has had a few mild hypoglycemic reactions--- will hold off on glipizide if he is low in the morning No sores or numbness in feet. Feet do hurt if he overdoes it with his walking  Still on prilosec Heartburn is controlled No swallowing problems  No problems with statin No myalgias No GI problems  Current Outpatient Prescriptions on File Prior to Visit  Medication Sig Dispense Refill  . aspirin 81 MG chewable tablet Chew 81 mg by mouth daily.      Marland Kitchen atorvastatin (LIPITOR) 10 MG tablet TAKE 1 TABLET EVERY DAY 90 tablet 1  . bimatoprost (LUMIGAN) 0.01 % SOLN 1 drop at bedtime.    Marland Kitchen doxycycline (VIBRA-TABS) 100 MG tablet Take 1 tablet (100 mg total) by mouth 2 (two) times daily. 20 tablet 0  . glipiZIDE (GLUCOTROL) 5 MG tablet TAKE 1 TABLET EVERY DAY 90 tablet 1  . glucose blood (RELION PRIME TEST) test strip Use as instructed to test blood sugar once daily dx: E11.9 100 each 3  . Insulin Pen Needle 32G X 4 MM MISC Use as directed at bedtime as needed (dx: 250.02). 200 each 1  . LANTUS SOLOSTAR 100 UNIT/ML Solostar Pen INJECT  24 UNITS SUBCUTANEOUSLY EVERY DAY  AT  10  PM 30 mL 1  . metFORMIN (GLUCOPHAGE) 500 MG tablet TAKE 1 TABLET THREE TIMES DAILY 270 tablet 1  . omeprazole (PRILOSEC) 20 MG capsule Take 20 mg by mouth daily.    . Multiple Vitamin (MULTIVITAMIN) capsule Take 1 capsule by mouth daily.       No current facility-administered medications on file prior to visit.    Allergies  Allergen Reactions  . Codeine   . Pioglitazone    REACTION: achy  . Sitagliptin Phosphate     REACTION: muscle aches  . Crestor [Rosuvastatin]     Muscle aching    Past Medical History  Diagnosis Date  . Diabetes mellitus   . Hyperlipidemia   . Glaucoma   . Migraines   . Reflux esophagitis     Past Surgical History  Procedure Laterality Date  . Umbilical hernia repair  2002  . Cataract extraction, bilateral      Family History  Problem Relation Age of Onset  . Diabetes Mother   . Diabetes Sister   . Heart disease Brother   . Diabetes Brother   . Heart disease Brother   . Diabetes Brother   . Diabetes Brother   . Diabetes Sister   . Diabetes Sister     History   Social History  . Marital Status: Married    Spouse Name: N/A    Number of Children: 2  . Years of Education: N/A   Occupational History  . semi retired Museum/gallery curator asst,  does Software engineer    Social History Main Topics  . Smoking status: Former Research scientist (life sciences)  . Smokeless tobacco: Never Used  . Alcohol Use: No  . Drug Use: No  . Sexual Activity: Not on file   Other Topics Concern  . Not on file   Social History  Narrative   No living will   No health care POA---okay with wife making decisions   Would accept resuscitation   Not sure about tube feeds   Review of Systems  Weight unfortunately is stable---making commitment to trying to eat less Sleeps very well Bowels are good No voiding problems. Pain did resolve with the antibiotic No rash. Has one chest lesion he wants checked No sig arthritic problems. Will feel hands if he overdoes it with work    Objective:   Physical Exam  Constitutional: He is oriented to person, place, and time. He appears well-developed and well-nourished. No distress.  HENT:  Mouth/Throat: Oropharynx is clear and moist. No oropharyngeal exudate.  Neck: Normal range of motion. Neck supple. No thyromegaly present.  Cardiovascular: Normal rate, regular rhythm, normal heart sounds and intact distal pulses.  Exam  reveals no gallop.   No murmur heard. Pulmonary/Chest: Effort normal and breath sounds normal. No respiratory distress. He has no wheezes. He has no rales.  Abdominal: Soft. He exhibits no distension. There is no tenderness. There is no rebound and no guarding.  Musculoskeletal: He exhibits no edema or tenderness.  Lymphadenopathy:    He has no cervical adenopathy.  Neurological: He is alert and oriented to person, place, and time.  President-- "Elyn Peers, Lujean Amel [place]" [phone] [id number] Recall 3/3  Normal sensation in plantar feet  Skin: No rash noted. No erythema.  No foot lesions seb keratosis on chest  Psychiatric: He has a normal mood and affect. His behavior is normal.          Assessment & Plan:

## 2014-07-20 NOTE — Assessment & Plan Note (Signed)
Has been better He is going to try to slowly wean the PPI

## 2014-07-20 NOTE — Assessment & Plan Note (Signed)
No problems with statin Due for labs 

## 2014-07-20 NOTE — Addendum Note (Signed)
Addended by: Jacqualin Combes on: 07/20/2014 03:37 PM   Modules accepted: Orders

## 2014-07-20 NOTE — Assessment & Plan Note (Signed)
I have personally reviewed the Medicare Annual Wellness questionnaire and have noted 1. The patient's medical and social history 2. Their use of alcohol, tobacco or illicit drugs 3. Their current medications and supplements 4. The patient's functional ability including ADL's, fall risks, home safety risks and hearing or visual             impairment. 5. Diet and physical activities 6. Evidence for depression or mood disorders  The patients weight, height, BMI and visual acuity have been recorded in the chart I have made referrals, counseling and provided education to the patient based review of the above and I have provided the pt with a written personalized care plan for preventive services.  I have provided you with a copy of your personalized plan for preventive services. Please take the time to review along with your updated medication list.  prevnar today Colonoscopy due 2018 Defer PSA till at least next year Discussed fitness and weight loss

## 2014-07-20 NOTE — Assessment & Plan Note (Signed)
See social history Blank forms given 

## 2014-07-21 ENCOUNTER — Telehealth: Payer: Self-pay | Admitting: Internal Medicine

## 2014-07-21 LAB — CBC WITH DIFFERENTIAL/PLATELET
Basophils Absolute: 0 10*3/uL (ref 0.0–0.1)
Basophils Relative: 0.6 % (ref 0.0–3.0)
EOS ABS: 0.1 10*3/uL (ref 0.0–0.7)
EOS PCT: 1.7 % (ref 0.0–5.0)
HEMATOCRIT: 44.9 % (ref 39.0–52.0)
Hemoglobin: 15.4 g/dL (ref 13.0–17.0)
LYMPHS ABS: 2.7 10*3/uL (ref 0.7–4.0)
Lymphocytes Relative: 44.2 % (ref 12.0–46.0)
MCHC: 34.3 g/dL (ref 30.0–36.0)
MCV: 94.6 fl (ref 78.0–100.0)
MONO ABS: 0.6 10*3/uL (ref 0.1–1.0)
Monocytes Relative: 9.3 % (ref 3.0–12.0)
Neutro Abs: 2.7 10*3/uL (ref 1.4–7.7)
Neutrophils Relative %: 44.2 % (ref 43.0–77.0)
PLATELETS: 207 10*3/uL (ref 150.0–400.0)
RBC: 4.74 Mil/uL (ref 4.22–5.81)
RDW: 13.7 % (ref 11.5–15.5)
WBC: 6.2 10*3/uL (ref 4.0–10.5)

## 2014-07-21 LAB — COMPREHENSIVE METABOLIC PANEL
ALBUMIN: 4.5 g/dL (ref 3.5–5.2)
ALT: 19 U/L (ref 0–53)
AST: 18 U/L (ref 0–37)
Alkaline Phosphatase: 47 U/L (ref 39–117)
BUN: 14 mg/dL (ref 6–23)
CALCIUM: 9.4 mg/dL (ref 8.4–10.5)
CO2: 27 meq/L (ref 19–32)
CREATININE: 1.14 mg/dL (ref 0.40–1.50)
Chloride: 105 mEq/L (ref 96–112)
GFR: 68.14 mL/min (ref >60.00)
Glucose, Bld: 89 mg/dL (ref 70–99)
Potassium: 4.1 mEq/L (ref 3.5–5.1)
SODIUM: 139 meq/L (ref 135–145)
Total Bilirubin: 0.7 mg/dL (ref 0.2–1.2)
Total Protein: 7.1 g/dL (ref 6.0–8.3)

## 2014-07-21 LAB — LIPID PANEL
CHOLESTEROL: 159 mg/dL (ref 0–200)
HDL: 40.1 mg/dL (ref >39.00)
LDL Cholesterol: 91 mg/dL (ref 0–99)
NONHDL: 118.9
TRIGLYCERIDES: 141 mg/dL (ref 0.0–149.0)
Total CHOL/HDL Ratio: 4
VLDL: 28.2 mg/dL (ref 0.0–40.0)

## 2014-07-21 LAB — MICROALBUMIN / CREATININE URINE RATIO
Creatinine,U: 120.8 mg/dL
Microalb Creat Ratio: 0.5 mg/g (ref 0.0–30.0)
Microalb, Ur: 0.6 mg/dL (ref 0.0–1.9)

## 2014-07-21 LAB — T4, FREE: Free T4: 0.86 ng/dL (ref 0.60–1.60)

## 2014-07-21 LAB — HEMOGLOBIN A1C: Hgb A1c MFr Bld: 7.2 % — ABNORMAL HIGH (ref 4.6–6.5)

## 2014-07-21 NOTE — Telephone Encounter (Signed)
Per Dr Silvio Pate, he did not contact pt. Labs are still pending and no additional phone notes found

## 2014-07-21 NOTE — Telephone Encounter (Signed)
Pt called back, says he missed a call. Please call back at earliest convenience.  Thank you

## 2014-07-28 ENCOUNTER — Other Ambulatory Visit: Payer: Self-pay | Admitting: Internal Medicine

## 2014-07-31 DIAGNOSIS — D2339 Other benign neoplasm of skin of other parts of face: Secondary | ICD-10-CM | POA: Diagnosis not present

## 2014-07-31 DIAGNOSIS — L570 Actinic keratosis: Secondary | ICD-10-CM | POA: Diagnosis not present

## 2014-07-31 DIAGNOSIS — D235 Other benign neoplasm of skin of trunk: Secondary | ICD-10-CM | POA: Diagnosis not present

## 2014-07-31 DIAGNOSIS — L820 Inflamed seborrheic keratosis: Secondary | ICD-10-CM | POA: Diagnosis not present

## 2014-07-31 DIAGNOSIS — L578 Other skin changes due to chronic exposure to nonionizing radiation: Secondary | ICD-10-CM | POA: Diagnosis not present

## 2014-08-16 DIAGNOSIS — J029 Acute pharyngitis, unspecified: Secondary | ICD-10-CM | POA: Diagnosis not present

## 2014-08-24 DIAGNOSIS — B9689 Other specified bacterial agents as the cause of diseases classified elsewhere: Secondary | ICD-10-CM | POA: Diagnosis not present

## 2014-08-24 DIAGNOSIS — H02831 Dermatochalasis of right upper eyelid: Secondary | ICD-10-CM | POA: Diagnosis not present

## 2014-08-24 DIAGNOSIS — H02834 Dermatochalasis of left upper eyelid: Secondary | ICD-10-CM | POA: Diagnosis not present

## 2014-08-24 DIAGNOSIS — H0270 Unspecified degenerative disorders of eyelid and periocular area: Secondary | ICD-10-CM | POA: Diagnosis not present

## 2014-08-24 DIAGNOSIS — J329 Chronic sinusitis, unspecified: Secondary | ICD-10-CM | POA: Diagnosis not present

## 2014-10-17 ENCOUNTER — Ambulatory Visit (INDEPENDENT_AMBULATORY_CARE_PROVIDER_SITE_OTHER): Payer: Medicare Other | Admitting: Internal Medicine

## 2014-10-17 ENCOUNTER — Encounter: Payer: Self-pay | Admitting: Internal Medicine

## 2014-10-17 VITALS — BP 126/78 | HR 72 | Temp 97.8°F | Wt 222.0 lb

## 2014-10-17 DIAGNOSIS — L600 Ingrowing nail: Secondary | ICD-10-CM

## 2014-10-17 MED ORDER — MUPIROCIN 2 % EX OINT: 1.0000 "application " | TOPICAL_OINTMENT | Freq: Two times a day (BID) | CUTANEOUS | Status: DC

## 2014-10-17 NOTE — Progress Notes (Signed)
Pre visit review using our clinic review tool, if applicable. No additional management support is needed unless otherwise documented below in the visit note. 

## 2014-10-17 NOTE — Patient Instructions (Signed)
Infected Ingrown Toenail °An infected ingrown toenail occurs when the nail edge grows into the skin and bacteria invade the area. Symptoms include pain, tenderness, swelling, and pus drainage from the edge of the nail. Poorly fitting shoes, minor injuries, and improper cutting of the toenail may also contribute to the problem. You should cut your toenails squarely instead of rounding the edges. Do not cut them too short. Avoid tight or pointed toe shoes. Sometimes the ingrown portion of the nail must be removed. If your toenail is removed, it can take 3-4 months for it to re-grow. °HOME CARE INSTRUCTIONS  °· Soak your infected toe in warm water for 20-30 minutes, 2 to 3 times a day. °· Packing or dressings applied to the area should be changed daily. °· Take medicine as directed and finish them. °· Reduce activities and keep your foot elevated when able to reduce swelling and discomfort. Do this until the infection gets better. °· Wear sandals or go barefoot as much as possible while the infected area is sensitive. °· See your caregiver for follow-up care in 2-3 days if the infection is not better. °SEEK MEDICAL CARE IF:  °Your toe is becoming more red, swollen or painful. °MAKE SURE YOU:  °· Understand these instructions. °· Will watch your condition. °· Will get help right away if you are not doing well or get worse. °Document Released: 07/10/2004 Document Revised: 08/25/2011 Document Reviewed: 05/29/2008 °[employer]® Patient Information ©2015 [employer]. This information is not intended to replace advice given to you by your health care provider. Make sure you discuss any questions you have with your health care provider. ° °

## 2014-10-17 NOTE — Progress Notes (Signed)
Subjective:    Patient ID: Alexis Sharp, male    DOB: [birth year], 69 y.o.   MRN: [id number]  HPI  Pt presents to the clinic today with c/o a problem with his right big toe. He reports it has been red and swollen. This started. He feels a throbbing sensation in his toe. He thinks he may have an ingrown toenail. He did try to cut the nail. It does seem to be less painful now but is still red and swollen. He has not tried anything OTC for this. He does have DM 2.  Review of Systems      Past Medical History  Diagnosis Date  . Diabetes mellitus   . Hyperlipidemia   . Glaucoma   . Migraines   . Reflux esophagitis     Current Outpatient Prescriptions  Medication Sig Dispense Refill  . aspirin 81 MG chewable tablet Chew 81 mg by mouth daily.      Marland Kitchen atorvastatin (LIPITOR) 10 MG tablet TAKE 1 TABLET EVERY DAY 90 tablet 1  . bimatoprost (LUMIGAN) 0.01 % SOLN 1 drop at bedtime.    Marland Kitchen doxycycline (VIBRA-TABS) 100 MG tablet Take 1 tablet (100 mg total) by mouth 2 (two) times daily. 20 tablet 0  . glipiZIDE (GLUCOTROL) 5 MG tablet TAKE ONE TABLET BY MOUTH ONCE DAILY 90 tablet 1  . glucose blood (RELION PRIME TEST) test strip Use as instructed to test blood sugar once daily dx: E11.9 100 each 3  . Insulin Pen Needle 32G X 4 MM MISC Use as directed at bedtime as needed (dx: 250.02). 200 each 1  . LANTUS SOLOSTAR 100 UNIT/ML Solostar Pen INJECT  24 UNITS SUBCUTANEOUSLY EVERY DAY  AT  10  PM 30 mL 1  . metFORMIN (GLUCOPHAGE) 500 MG tablet TAKE 1 TABLET THREE TIMES DAILY 270 tablet 1  . Multiple Vitamin (MULTIVITAMIN) capsule Take 1 capsule by mouth daily.      Marland Kitchen omeprazole (PRILOSEC) 20 MG capsule Take 20 mg by mouth daily.     No current facility-administered medications for this visit.    Allergies  Allergen Reactions  . Codeine   . Pioglitazone     REACTION: achy  . Sitagliptin Phosphate     REACTION: muscle aches  . Crestor [Rosuvastatin]     Muscle aching    Family History    Problem Relation Age of Onset  . Diabetes Mother   . Diabetes Sister   . Heart disease Brother   . Diabetes Brother   . Heart disease Brother   . Diabetes Brother   . Diabetes Brother   . Diabetes Sister   . Diabetes Sister     History   Social History  . Marital Status: Married    Spouse Name: N/A  . Number of Children: 2  . Years of Education: N/A   Occupational History  . semi retired Museum/gallery curator asst,  does Software engineer    Social History Main Topics  . Smoking status: Former Research scientist (life sciences)  . Smokeless tobacco: Never Used  . Alcohol Use: No  . Drug Use: No  . Sexual Activity: Not on file   Other Topics Concern  . Not on file   Social History Narrative   No living will   No health care POA---okay with wife making decisions   Would accept resuscitation   Not sure about tube feeds     Constitutional: Denies fever, malaise, fatigue, headache or abrupt weight changes.  Musculoskeletal: Pt reports toe  pain and swelling. Denies decrease in range of motion, difficulty with gait, muscle pain.  Skin: Pt reports ingrown toenail. Denies  rashes, lesions or ulcercations.  Neurological: Denies numbness or tingling in the feet or problems with balance and coordination.   No other specific complaints in a complete review of systems (except as listed in HPI above).  Objective:   Physical Exam  BP 126/78 mmHg  Pulse 72  Temp(Src) 97.8 F (36.6 C) (Oral)  Wt 222 lb (100.699 kg)  SpO2 98% Wt Readings from Last 3 Encounters:  10/17/14 222 lb (100.699 kg)  07/20/14 221 lb 12.8 oz (100.608 kg)  05/24/14 220 lb (99.791 kg)    General: Appears his stated age, well developed, well nourished in NAD. Skin: Warm, dry and intact. Mild redness of the right big toe, but no warmth noted. Ingrown toenail has been removed by pt. Unable to express and drainage. Non tender to touch.  Cardiovascular: Normal rate and rhythm. S1,S2 noted.  No murmur, rubs or gallops noted.   Pulmonary/Chest: Normal effort and positive vesicular breath sounds. No respiratory distress. No wheezes, rales or ronchi noted.  Musculoskeletal: Normal flexion and extension of the right great toe. No difficulty with gait.  Neurological: Alert and oriented. Sensation intact to BLE.  BMET    Component Value Date/Time   NA 139 07/20/2014 1542   K 4.1 07/20/2014 1542   CL 105 07/20/2014 1542   CO2 27 07/20/2014 1542   GLUCOSE 89 07/20/2014 1542   BUN 14 07/20/2014 1542   CREATININE 1.14 07/20/2014 1542   CALCIUM 9.4 07/20/2014 1542   GFRNONAA 81 11/01/2007 1536   GFRAA 98 11/01/2007 1536    Lipid Panel     Component Value Date/Time   CHOL 159 07/20/2014 1542   TRIG 141.0 07/20/2014 1542   HDL 40.10 07/20/2014 1542   CHOLHDL 4 07/20/2014 1542   VLDL 28.2 07/20/2014 1542   LDLCALC 91 07/20/2014 1542    CBC    Component Value Date/Time   WBC 6.2 07/20/2014 1542   RBC 4.74 07/20/2014 1542   HGB 15.4 07/20/2014 1542   HCT 44.9 07/20/2014 1542   PLT 207.0 07/20/2014 1542   MCV 94.6 07/20/2014 1542   MCHC 34.3 07/20/2014 1542   RDW 13.7 07/20/2014 1542   LYMPHSABS 2.7 07/20/2014 1542   MONOABS 0.6 07/20/2014 1542   EOSABS 0.1 07/20/2014 1542   BASOSABS 0.0 07/20/2014 1542    Hgb A1C Lab Results  Component Value Date   HGBA1C 7.2* 07/20/2014         Assessment & Plan:   Ingrown toenail, right great toe:  Does not appear to be infected Will have him to do warm epsom salt soaks BID  eRx for Bactroban to affected area twice daily after the epsom salt soaks Watch for increased redness, pain or swelling  RTC as needed or if symptoms persist or worsen

## 2014-11-02 DIAGNOSIS — L03031 Cellulitis of right toe: Secondary | ICD-10-CM | POA: Diagnosis not present

## 2014-11-02 DIAGNOSIS — E119 Type 2 diabetes mellitus without complications: Secondary | ICD-10-CM | POA: Diagnosis not present

## 2014-11-27 ENCOUNTER — Other Ambulatory Visit: Payer: Self-pay | Admitting: *Deleted

## 2014-11-27 MED ORDER — GLIPIZIDE 5 MG PO TABS: 5.0000 mg | ORAL_TABLET | Freq: Every day | ORAL | Status: DC

## 2014-12-16 ENCOUNTER — Other Ambulatory Visit: Payer: Self-pay | Admitting: Internal Medicine

## 2015-01-05 DIAGNOSIS — K219 Gastro-esophageal reflux disease without esophagitis: Secondary | ICD-10-CM | POA: Diagnosis not present

## 2015-01-05 DIAGNOSIS — H0270 Unspecified degenerative disorders of eyelid and periocular area: Secondary | ICD-10-CM | POA: Diagnosis not present

## 2015-01-05 DIAGNOSIS — L738 Other specified follicular disorders: Secondary | ICD-10-CM | POA: Diagnosis not present

## 2015-01-05 DIAGNOSIS — H02831 Dermatochalasis of right upper eyelid: Secondary | ICD-10-CM | POA: Diagnosis not present

## 2015-01-05 DIAGNOSIS — H02834 Dermatochalasis of left upper eyelid: Secondary | ICD-10-CM | POA: Diagnosis not present

## 2015-01-05 DIAGNOSIS — E119 Type 2 diabetes mellitus without complications: Secondary | ICD-10-CM | POA: Diagnosis not present

## 2015-01-05 DIAGNOSIS — Z794 Long term (current) use of insulin: Secondary | ICD-10-CM | POA: Diagnosis not present

## 2015-01-05 DIAGNOSIS — H02403 Unspecified ptosis of bilateral eyelids: Secondary | ICD-10-CM | POA: Diagnosis not present

## 2015-01-17 DIAGNOSIS — D3131 Benign neoplasm of right choroid: Secondary | ICD-10-CM | POA: Diagnosis not present

## 2015-01-17 DIAGNOSIS — E119 Type 2 diabetes mellitus without complications: Secondary | ICD-10-CM | POA: Diagnosis not present

## 2015-01-17 DIAGNOSIS — H43813 Vitreous degeneration, bilateral: Secondary | ICD-10-CM | POA: Diagnosis not present

## 2015-01-17 DIAGNOSIS — H4011X3 Primary open-angle glaucoma, severe stage: Secondary | ICD-10-CM | POA: Diagnosis not present

## 2015-01-30 DIAGNOSIS — L578 Other skin changes due to chronic exposure to nonionizing radiation: Secondary | ICD-10-CM | POA: Diagnosis not present

## 2015-01-30 DIAGNOSIS — L820 Inflamed seborrheic keratosis: Secondary | ICD-10-CM | POA: Diagnosis not present

## 2015-01-30 DIAGNOSIS — L570 Actinic keratosis: Secondary | ICD-10-CM | POA: Diagnosis not present

## 2015-01-30 DIAGNOSIS — L821 Other seborrheic keratosis: Secondary | ICD-10-CM | POA: Diagnosis not present

## 2015-02-22 ENCOUNTER — Other Ambulatory Visit: Payer: Self-pay | Admitting: Internal Medicine

## 2015-03-19 ENCOUNTER — Encounter: Payer: Self-pay | Admitting: Internal Medicine

## 2015-03-21 ENCOUNTER — Ambulatory Visit: Payer: Medicare Other | Admitting: Internal Medicine

## 2015-03-23 ENCOUNTER — Encounter: Payer: Self-pay | Admitting: Internal Medicine

## 2015-03-23 ENCOUNTER — Ambulatory Visit (INDEPENDENT_AMBULATORY_CARE_PROVIDER_SITE_OTHER): Payer: Medicare Other | Admitting: Internal Medicine

## 2015-03-23 VITALS — BP 136/80 | HR 57 | Temp 98.2°F | Wt 214.0 lb

## 2015-03-23 DIAGNOSIS — Z794 Long term (current) use of insulin: Secondary | ICD-10-CM

## 2015-03-23 DIAGNOSIS — E119 Type 2 diabetes mellitus without complications: Secondary | ICD-10-CM | POA: Diagnosis not present

## 2015-03-23 DIAGNOSIS — Z23 Encounter for immunization: Secondary | ICD-10-CM | POA: Diagnosis not present

## 2015-03-23 LAB — HEMOGLOBIN A1C: Hgb A1c MFr Bld: 6.6 % — ABNORMAL HIGH (ref 4.6–6.5)

## 2015-03-23 NOTE — Assessment & Plan Note (Signed)
Seems to still have good control Only uses the glipizide intermittently Will check A1c again

## 2015-03-23 NOTE — Addendum Note (Signed)
Addended by: Despina Hidden on: 03/23/2015 10:10 AM   Modules accepted: Orders

## 2015-03-23 NOTE — Progress Notes (Signed)
Pre visit review using our clinic review tool, if applicable. No additional management support is needed unless otherwise documented below in the visit note. 

## 2015-03-23 NOTE — Progress Notes (Signed)
Subjective:    Patient ID: Alexis Sharp, male    DOB: 02/21/1946, 69 y.o.   MRN: [id number]  HPI Here for follow up of diabetes Doing well  Checks sugars daily 70's - 140 in AM Has had some hypoglycemic reactions--had to back off on the glipizide Only takes these if he is high in the AM  Has been careful with eating Has lost about 8#  Current Outpatient Prescriptions on File Prior to Visit  Medication Sig Dispense Refill  . aspirin 81 MG chewable tablet Chew 81 mg by mouth daily.      Marland Kitchen atorvastatin (LIPITOR) 10 MG tablet TAKE 1 TABLET EVERY DAY 90 tablet 1  . bimatoprost (LUMIGAN) 0.01 % SOLN 1 drop at bedtime.    Marland Kitchen glipiZIDE (GLUCOTROL) 5 MG tablet Take 1 tablet (5 mg total) by mouth daily. 90 tablet 3  . glucose blood (RELION PRIME TEST) test strip Use as instructed to test blood sugar once daily dx: E11.9 100 each 3  . Insulin Pen Needle (BD PEN NEEDLE NANO U/F) 32G X 4 MM MISC Use once daily to administer insulin dx: E11.9 90 each 3  . LANTUS SOLOSTAR 100 UNIT/ML Solostar Pen INJECT  24 UNITS SUBCUTANEOUSLY EVERY DAY  AT  10  PM 30 mL 1  . metFORMIN (GLUCOPHAGE) 500 MG tablet TAKE 1 TABLET THREE TIMES DAILY 270 tablet 1  . Multiple Vitamin (MULTIVITAMIN) capsule Take 1 capsule by mouth daily.      Marland Kitchen omeprazole (PRILOSEC) 20 MG capsule Take 20 mg by mouth daily.     No current facility-administered medications on file prior to visit.    Allergies  Allergen Reactions  . Codeine   . Pioglitazone     REACTION: achy  . Sitagliptin Phosphate     REACTION: muscle aches  . Crestor [Rosuvastatin]     Muscle aching    Past Medical History  Diagnosis Date  . Diabetes mellitus   . Hyperlipidemia   . Glaucoma   . Migraines   . Reflux esophagitis     Past Surgical History  Procedure Laterality Date  . Umbilical hernia repair  2002  . Cataract extraction, bilateral      Family History  Problem Relation Age of Onset  . Diabetes Mother   . Diabetes Sister   .  Heart disease Brother   . Diabetes Brother   . Heart disease Brother   . Diabetes Brother   . Diabetes Brother   . Diabetes Sister   . Diabetes Sister     Social History   Social History  . Marital Status: Married    Spouse Name: N/A  . Number of Children: 2  . Years of Education: N/A   Occupational History  . semi retired Museum/gallery curator asst,  does Software engineer    Social History Main Topics  . Smoking status: Former Research scientist (life sciences)  . Smokeless tobacco: Never Used  . Alcohol Use: No  . Drug Use: No  . Sexual Activity: Not on file   Other Topics Concern  . Not on file   Social History Narrative   No living will   No health care POA---okay with wife making decisions   Would accept resuscitation   Not sure about tube feeds   Review of Systems  No chest pain No SOB Soreness in feet--related to spending all day on concrete floors     Objective:   Physical Exam  Constitutional: He appears well-developed and well-nourished. No distress.  Neck: Normal range of motion. Neck supple. No thyromegaly present.  Cardiovascular: Normal rate, regular rhythm, normal heart sounds and intact distal pulses.  Exam reveals no gallop.   No murmur heard. Pulmonary/Chest: Effort normal and breath sounds normal. No respiratory distress. He has no wheezes. He has no rales.  Musculoskeletal: He exhibits no edema or tenderness.  Lymphadenopathy:    He has no cervical adenopathy.  Psychiatric: He has a normal mood and affect. His behavior is normal.          Assessment & Plan:

## 2015-03-27 ENCOUNTER — Ambulatory Visit: Payer: Medicare Other | Admitting: Internal Medicine

## 2015-04-03 ENCOUNTER — Encounter: Payer: Self-pay | Admitting: Cardiology

## 2015-04-03 ENCOUNTER — Other Ambulatory Visit: Payer: Self-pay | Admitting: Internal Medicine

## 2015-05-23 DIAGNOSIS — H43813 Vitreous degeneration, bilateral: Secondary | ICD-10-CM | POA: Diagnosis not present

## 2015-05-23 DIAGNOSIS — H401133 Primary open-angle glaucoma, bilateral, severe stage: Secondary | ICD-10-CM | POA: Diagnosis not present

## 2015-05-29 ENCOUNTER — Encounter: Payer: Self-pay | Admitting: Family Medicine

## 2015-05-29 ENCOUNTER — Ambulatory Visit (INDEPENDENT_AMBULATORY_CARE_PROVIDER_SITE_OTHER): Payer: Medicare Other | Admitting: Family Medicine

## 2015-05-29 VITALS — BP 130/80 | HR 68 | Temp 98.5°F | Ht 71.0 in | Wt 220.5 lb

## 2015-05-29 DIAGNOSIS — J069 Acute upper respiratory infection, unspecified: Secondary | ICD-10-CM | POA: Insufficient documentation

## 2015-05-29 DIAGNOSIS — B9789 Other viral agents as the cause of diseases classified elsewhere: Principal | ICD-10-CM

## 2015-05-29 MED ORDER — AZITHROMYCIN 250 MG PO TABS: ORAL_TABLET | ORAL | Status: DC

## 2015-05-29 MED ORDER — GLUCOSE BLOOD VI STRP: ORAL_STRIP | Status: DC

## 2015-05-29 NOTE — Assessment & Plan Note (Signed)
Recommended symptomatic care as likely viral infection. GIven DM and going away on long trip.. Will provide Rx for antibiotics to use if not improving as expected.

## 2015-05-29 NOTE — Progress Notes (Signed)
Pre visit review using our clinic review tool, if applicable. No additional management support is needed unless otherwise documented below in the visit note. 

## 2015-05-29 NOTE — Patient Instructions (Signed)
Start Mucinex DM twice daily.  Nasal saline irrigation daily.  If not turing the corner in next 3-4 days fill antibiotics.  Go to ER for severe shortness of breath.

## 2015-05-29 NOTE — Progress Notes (Signed)
   Subjective:    Patient ID: Alexis Sharp, male    DOB: Feb 15, 1946, 69 y.o.   MRN: [id number]  Cough This is a new problem. The current episode started in the past 7 days (5-6 days). The problem has been gradually worsening. The cough is productive of purulent sputum. Associated symptoms include headaches, nasal congestion, postnasal drip and a sore throat. Pertinent negatives include no ear congestion, ear pain, fever, shortness of breath or wheezing. Associated symptoms comments: frontal pain in sinuses. The symptoms are aggravated by lying down (cough keeping him up at night). Risk factors: nonsmoker. Treatments tried: nasocort, cough drops, mucinex. His past medical history is significant for asthma. There is no history of COPD, environmental allergies or pneumonia. diabetes mellitus     Social History /Family History/Past Medical History reviewed and updated if needed.  Review of Systems  Constitutional: Negative for fever.  HENT: Positive for postnasal drip and sore throat. Negative for ear pain.   Respiratory: Positive for cough. Negative for shortness of breath and wheezing.   Allergic/Immunologic: Negative for environmental allergies.  Neurological: Positive for headaches.       Objective:   Physical Exam  Constitutional: Vital signs are normal. He appears well-developed and well-nourished.  Non-toxic appearance. He does not appear ill. No distress.  HENT:  Head: Normocephalic and atraumatic.  Right Ear: Hearing, tympanic membrane, external ear and ear canal normal. No tenderness. No foreign bodies. Tympanic membrane is not retracted and not bulging.  Left Ear: Hearing, tympanic membrane, external ear and ear canal normal. No tenderness. No foreign bodies. Tympanic membrane is not retracted and not bulging.  Nose: Mucosal edema and rhinorrhea present. Right sinus exhibits frontal sinus tenderness. Right sinus exhibits no maxillary sinus tenderness. Left sinus exhibits frontal  sinus tenderness. Left sinus exhibits no maxillary sinus tenderness.  Mouth/Throat: Uvula is midline and mucous membranes are normal. Normal dentition. No dental caries. Posterior oropharyngeal erythema present. No oropharyngeal exudate, posterior oropharyngeal edema or tonsillar abscesses.  Eyes: Conjunctivae, EOM and lids are normal. Pupils are equal, round, and reactive to light. Lids are everted and swept, no foreign bodies found.  Neck: Trachea normal, normal range of motion and phonation normal. Neck supple. Carotid bruit is not present. No thyroid mass and no thyromegaly present.  Cardiovascular: Normal rate, regular rhythm, S1 normal, S2 normal, normal heart sounds, intact distal pulses and normal pulses.  Exam reveals no gallop.   No murmur heard. Pulmonary/Chest: Effort normal and breath sounds normal. No respiratory distress. He has no wheezes. He has no rhonchi. He has no rales.  Abdominal: Soft. Normal appearance and bowel sounds are normal. There is no hepatosplenomegaly. There is no tenderness. There is no rebound, no guarding and no CVA tenderness. No hernia.  Neurological: He is alert. He has normal reflexes.  Skin: Skin is warm, dry and intact. No rash noted.  Psychiatric: He has a normal mood and affect. His speech is normal and behavior is normal. Judgment normal.          Assessment & Plan:

## 2015-07-02 LAB — HM DIABETES EYE EXAM

## 2015-08-22 ENCOUNTER — Other Ambulatory Visit: Payer: Self-pay | Admitting: Internal Medicine

## 2015-08-29 ENCOUNTER — Other Ambulatory Visit: Payer: Self-pay | Admitting: Internal Medicine

## 2015-08-29 DIAGNOSIS — H401133 Primary open-angle glaucoma, bilateral, severe stage: Secondary | ICD-10-CM | POA: Diagnosis not present

## 2015-08-29 NOTE — Telephone Encounter (Signed)
Rx sent electronically.  

## 2015-09-14 ENCOUNTER — Other Ambulatory Visit: Payer: Self-pay | Admitting: Internal Medicine

## 2015-10-18 ENCOUNTER — Other Ambulatory Visit: Payer: Self-pay | Admitting: Internal Medicine

## 2015-10-18 DIAGNOSIS — Z794 Long term (current) use of insulin: Secondary | ICD-10-CM | POA: Insufficient documentation

## 2015-10-18 DIAGNOSIS — E118 Type 2 diabetes mellitus with unspecified complications: Secondary | ICD-10-CM | POA: Insufficient documentation

## 2015-10-18 DIAGNOSIS — E119 Type 2 diabetes mellitus without complications: Secondary | ICD-10-CM

## 2015-10-24 ENCOUNTER — Other Ambulatory Visit (INDEPENDENT_AMBULATORY_CARE_PROVIDER_SITE_OTHER): Payer: Medicare Other

## 2015-10-24 ENCOUNTER — Ambulatory Visit (INDEPENDENT_AMBULATORY_CARE_PROVIDER_SITE_OTHER): Payer: Medicare Other

## 2015-10-24 VITALS — BP 120/80 | HR 65 | Temp 98.1°F | Ht 70.5 in | Wt 219.5 lb

## 2015-10-24 DIAGNOSIS — E119 Type 2 diabetes mellitus without complications: Secondary | ICD-10-CM | POA: Diagnosis not present

## 2015-10-24 DIAGNOSIS — Z0000 Encounter for general adult medical examination without abnormal findings: Secondary | ICD-10-CM

## 2015-10-24 DIAGNOSIS — Z1159 Encounter for screening for other viral diseases: Secondary | ICD-10-CM

## 2015-10-24 DIAGNOSIS — Z23 Encounter for immunization: Secondary | ICD-10-CM

## 2015-10-24 DIAGNOSIS — Z125 Encounter for screening for malignant neoplasm of prostate: Secondary | ICD-10-CM

## 2015-10-24 LAB — COMPREHENSIVE METABOLIC PANEL
ALT: 16 U/L (ref 0–53)
AST: 16 U/L (ref 0–37)
Albumin: 4.5 g/dL (ref 3.5–5.2)
Alkaline Phosphatase: 42 U/L (ref 39–117)
BUN: 15 mg/dL (ref 6–23)
CALCIUM: 9.1 mg/dL (ref 8.4–10.5)
CHLORIDE: 104 meq/L (ref 96–112)
CO2: 28 mEq/L (ref 19–32)
CREATININE: 0.97 mg/dL (ref 0.40–1.50)
GFR: 81.79 mL/min (ref >60.00)
GLUCOSE: 178 mg/dL — AB (ref 70–99)
Potassium: 4.5 mEq/L (ref 3.5–5.1)
Sodium: 139 mEq/L (ref 135–145)
Total Bilirubin: 0.5 mg/dL (ref 0.2–1.2)
Total Protein: 6.7 g/dL (ref 6.0–8.3)

## 2015-10-24 LAB — LIPID PANEL
Cholesterol: 189 mg/dL (ref 0–200)
HDL: 38 mg/dL — AB (ref >39.00)
LDL Cholesterol: 128 mg/dL — ABNORMAL HIGH (ref 0–99)
NonHDL: 150.63
Total CHOL/HDL Ratio: 5
Triglycerides: 115 mg/dL (ref 0.0–149.0)
VLDL: 23 mg/dL (ref 0.0–40.0)

## 2015-10-24 LAB — MICROALBUMIN / CREATININE URINE RATIO
CREATININE, U: 157.2 mg/dL
MICROALB UR: 8.2 mg/dL — AB (ref 0.0–1.9)
MICROALB/CREAT RATIO: 5.2 mg/g (ref 0.0–30.0)

## 2015-10-24 LAB — CBC WITH DIFFERENTIAL/PLATELET
BASOS ABS: 0 10*3/uL (ref 0.0–0.1)
Basophils Relative: 0.7 % (ref 0.0–3.0)
Eosinophils Absolute: 0.1 10*3/uL (ref 0.0–0.7)
Eosinophils Relative: 2.1 % (ref 0.0–5.0)
HCT: 44.1 % (ref 39.0–52.0)
HEMOGLOBIN: 14.9 g/dL (ref 13.0–17.0)
LYMPHS ABS: 1.7 10*3/uL (ref 0.7–4.0)
Lymphocytes Relative: 33.8 % (ref 12.0–46.0)
MCHC: 33.9 g/dL (ref 30.0–36.0)
MCV: 95.6 fl (ref 78.0–100.0)
MONO ABS: 0.4 10*3/uL (ref 0.1–1.0)
MONOS PCT: 7.9 % (ref 3.0–12.0)
Neutro Abs: 2.7 10*3/uL (ref 1.4–7.7)
Neutrophils Relative %: 55.5 % (ref 43.0–77.0)
Platelets: 212 10*3/uL (ref 150.0–400.0)
RBC: 4.61 Mil/uL (ref 4.22–5.81)
RDW: 13.5 % (ref 11.5–15.5)
WBC: 4.9 10*3/uL (ref 4.0–10.5)

## 2015-10-24 LAB — PSA, MEDICARE: PSA: 0.45 ng/ml (ref 0.10–4.00)

## 2015-10-24 LAB — T4, FREE: FREE T4: 0.79 ng/dL (ref 0.60–1.60)

## 2015-10-24 LAB — HEMOGLOBIN A1C: Hgb A1c MFr Bld: 6.9 % — ABNORMAL HIGH (ref 4.6–6.5)

## 2015-10-24 NOTE — Progress Notes (Signed)
PCP notes:   Health maintenance: Hep C screening, A1C, microalbumin, and PSA labs completed. PPSV23 administered. Eye exam on 06/26/15 per pt report. Foot exam needed at appt on 11/14/15 with PCP.

## 2015-10-24 NOTE — Progress Notes (Signed)
Pre visit review using our clinic review tool, if applicable. No additional management support is needed unless otherwise documented below in the visit note. 

## 2015-10-24 NOTE — Patient Instructions (Signed)
Alexis Sharp , Thank you for taking time to come for your Medicare Wellness Visit. I appreciate your ongoing commitment to your health goals. Please review the following plan we discussed and let me know if I can assist you in the future.   These are the goals we discussed: Goals    . Weight < 200 lb (90.719 kg)     Starting 10/24/2015, I will continue to monitor portion sizes and make healthy food choices in an effort to reach target weight of 180 lbs.        This is a list of the screening recommended for you and due dates:  Health Maintenance  Topic Date Due  . Complete foot exam   11/15/2015*  . Flu Shot  01/15/2016  . Hemoglobin A1C  04/25/2016  . Eye exam for diabetics  06/25/2016  . Urine Protein Check  10/23/2016  . Colon Cancer Screening  05/19/2017  . Tetanus Vaccine  06/30/2023  . Shingles Vaccine  Completed  .  Hepatitis C: One time screening is recommended by Center for Disease Control  (CDC) for  adults born from [birth year] through 1963.   Completed  . Pneumonia vaccines  Completed  *Topic was postponed. The date shown is not the original due date.    Preventive Care for Adults  A healthy lifestyle and preventive care can promote health and wellness. Preventive health guidelines for adults include the following key practices.  . A routine yearly physical is a good way to check with your health care provider about your health and preventive screening. It is a chance to share any concerns and updates on your health and to receive a thorough exam.  . Visit your dentist for a routine exam and preventive care every 6 months. Brush your teeth twice a day and floss once a day. Good oral hygiene prevents tooth decay and gum disease.  . The frequency of eye exams is based on your age, health, family medical history, use  of contact lenses, and other factors. Follow your health care provider's ecommendations for frequency of eye exams.  . Eat a healthy diet. Foods like vegetables,  fruits, whole grains, low-fat dairy products, and lean protein foods contain the nutrients you need without too many calories. Decrease your intake of foods high in solid fats, added sugars, and salt. Eat the right amount of calories for you. Get information about a proper diet from your health care provider, if necessary.  . Regular physical exercise is one of the most important things you can do for your health. Most adults should get at least 150 minutes of moderate-intensity exercise (any activity that increases your heart rate and causes you to sweat) each week. In addition, most adults need muscle-strengthening exercises on 2 or more days a week.  Silver Sneakers may be a benefit available to you. To determine eligibility, you may visit the website: www.silversneakers.com or contact program at 7121924469 Mon-Fri between 8AM-8PM.   . Maintain a healthy weight. The body mass index (BMI) is a screening tool to identify possible weight problems. It provides an estimate of body fat based on height and weight. Your health care provider can find your BMI and can help you achieve or maintain a healthy weight.   For adults 20 years and older: ? A BMI below 18.5 is considered underweight. ? A BMI of 18.5 to 24.9 is normal. ? A BMI of 25 to 29.9 is considered overweight. ? A BMI of 30 and above is  considered obese.   . Maintain normal blood lipids and cholesterol levels by exercising and minimizing your intake of saturated fat. Eat a balanced diet with plenty of fruit and vegetables. Blood tests for lipids and cholesterol should begin at age 44 and be repeated every 5 years. If your lipid or cholesterol levels are high, you are over 50, or you are at high risk for heart disease, you may need your cholesterol levels checked more frequently. Ongoing high lipid and cholesterol levels should be treated with medicines if diet and exercise are not working.  . If you smoke, find out from your health care  provider how to quit. If you do not use tobacco, please do not start.  . If you choose to drink alcohol, please do not consume more than 2 drinks per day. One drink is considered to be 12 ounces (355 mL) of beer, 5 ounces (148 mL) of wine, or 1.5 ounces (44 mL) of liquor.  . If you are 63-80 years old, ask your health care provider if you should take aspirin to prevent strokes.  . Use sunscreen. Apply sunscreen liberally and repeatedly throughout the day. You should seek shade when your shadow is shorter than you. Protect yourself by wearing long sleeves, pants, a wide-brimmed hat, and sunglasses year round, whenever you are outdoors.  . Once a month, do a whole body skin exam, using a mirror to look at the skin on your back. Tell your health care provider of new moles, moles that have irregular borders, moles that are larger than a pencil eraser, or moles that have changed in shape or color.

## 2015-10-24 NOTE — Progress Notes (Signed)
   Subjective:    Patient ID: Alexis Sharp, male    DOB: 1945/10/06, 70 y.o.   MRN: [id number]  HPI I reviewed health advisor's note, was available for consultation, and agree with documentation and plan.    Review of Systems     Objective:   Physical Exam        Assessment & Plan:

## 2015-10-24 NOTE — Progress Notes (Signed)
Subjective:   Alexis Sharp is a 70 y.o. male who presents for Medicare Annual/Subsequent preventive examination.  Review of Systems:  N/A Cardiac Risk Factors include: advanced age (>18men, >55 women);male gender;diabetes mellitus;dyslipidemia;obesity (BMI >30kg/m2)     Objective:    Vitals: BP 120/80 mmHg  Pulse 65  Temp(Src) 98.1 F (36.7 C) (Oral)  Ht 5' 10.5" (1.791 m)  Wt 219 lb 8 oz (99.565 kg)  BMI 31.04 kg/m2  SpO2 98%  Body mass index is 31.04 kg/(m^2).  Tobacco History  Smoking status  . Former Smoker  Smokeless tobacco  . Never Used     Counseling given: No   Past Medical History  Diagnosis Date  . Diabetes mellitus   . Hyperlipidemia   . Glaucoma   . Migraines   . Reflux esophagitis    Past Surgical History  Procedure Laterality Date  . Umbilical hernia repair  2002  . Cataract extraction, bilateral     Family History  Problem Relation Age of Onset  . Diabetes Mother   . Diabetes Sister   . Heart disease Brother   . Diabetes Brother   . Heart disease Brother   . Diabetes Brother   . Diabetes Brother   . Diabetes Sister   . Diabetes Sister    History  Sexual Activity  . Sexual Activity: Yes    Outpatient Encounter Prescriptions as of 10/24/2015  Medication Sig  . aspirin 81 MG chewable tablet Chew 81 mg by mouth daily.    Marland Kitchen atorvastatin (LIPITOR) 10 MG tablet TAKE 1 TABLET EVERY DAY  . bimatoprost (LUMIGAN) 0.01 % SOLN 1 drop at bedtime.  Marland Kitchen glipiZIDE (GLUCOTROL) 5 MG tablet TAKE 1 TABLET EVERY DAY  . glucose blood (RELION PRIME TEST) test strip Use as instructed to test blood sugar once daily dx: E11.9  . Insulin Pen Needle (BD PEN NEEDLE NANO U/F) 32G X 4 MM MISC Use once daily to administer insulin dx: E11.9  . LANTUS SOLOSTAR 100 UNIT/ML Solostar Pen INJECT  24 UNITS SUBCUTANEOUSLY EVERY DAY  AT  10  PM  . metFORMIN (GLUCOPHAGE) 500 MG tablet TAKE 1 TABLET THREE TIMES DAILY  . Multiple Vitamin (MULTIVITAMIN) capsule Take 1 capsule  by mouth daily.    Marland Kitchen omeprazole (PRILOSEC) 20 MG capsule Take 20 mg by mouth daily.  . timolol (TIMOPTIC) 0.5 % ophthalmic solution   . [DISCONTINUED] azithromycin (ZITHROMAX) 250 MG tablet 2 tab po x 1 day then 1 tab po daily   No facility-administered encounter medications on file as of 10/24/2015.    Activities of Daily Living In your present state of health, do you have any difficulty performing the following activities: 10/24/2015  Hearing? Y  Vision? N  Difficulty concentrating or making decisions? N  Walking or climbing stairs? N  Dressing or bathing? N  Doing errands, shopping? N  Preparing Food and eating ? N  Using the Toilet? N  In the past six months, have you accidently leaked urine? N  Do you have problems with loss of bowel control? N  Managing your Medications? N  Managing your Finances? N  Housekeeping or managing your Housekeeping? N    Patient Care Team: Venia Carbon, MD as PCP - General Marylynn Pearson, MD as Consulting Physician (Ophthalmology)   Assessment:    Hearing Screening Comments: Wears bilateral hearing aids Vision Screening Comments: Last eye exam in January 2017  Exercise Activities and Dietary recommendations Current Exercise Habits: Home exercise routine, Type of exercise:  strength training/weights;walking, Time (Minutes): 30, Frequency (Times/Week): 3, Weekly Exercise (Minutes/Week): 90, Intensity: Moderate, Exercise limited by: None identified  Goals    . Weight < 200 lb (90.719 kg)     Starting 10/24/2015, I will continue to monitor portion sizes and make healthy food choices in an effort to reach target weight of 180 lbs.       Fall Risk Fall Risk  10/24/2015 07/20/2014 06/29/2013 06/29/2013 12/08/2012  Falls in the past year? No No No No No   Depression Screen PHQ 2/9 Scores 10/24/2015 07/20/2014 06/29/2013 06/29/2013  PHQ - 2 Score 0 0 0 0    Cognitive Testing MMSE - Mini Mental State Exam 10/24/2015  Orientation to time 5  Orientation  to Place 5  Registration 3  Attention/ Calculation 0  Recall 3  Language- name 2 objects 0  Language- repeat 1  Language- follow 3 step command 3  Language- read & follow direction 0  Write a sentence 0  Copy design 0  Total score 20   PLEASE NOTE: A Mini-Cog screen was completed. Maximum score is 20. A value of 0 denotes this part of Folstein MMSE was not completed or the patient failed this part of the Mini-Cog screening.   Mini-Cog Screening Orientation to Time - Max 5 pts Orientation to Place - Max 5 pts Registration - Max 3 pts Recall - Max 3 pts Language Repeat - Max 1 pts Language Follow 3 Step Command - Max 3 pts   Immunization History  Administered Date(s) Administered  . Influenza Whole 04/19/2007, 03/15/2009, 04/16/2010  . Influenza,inj,Quad PF,36+ Mos 03/23/2015  . Influenza-Unspecified 04/18/2013, 04/16/2014  . Pneumococcal Conjugate-13 07/20/2014  . Pneumococcal Polysaccharide-23 04/16/2010, 10/24/2015  . Td 06/17/2003, 06/29/2013  . Zoster 12/25/2010   Screening Tests Health Maintenance  Topic Date Due  . FOOT EXAM  11/15/2015 (Originally 07/21/2015)  . INFLUENZA VACCINE  01/15/2016  . HEMOGLOBIN A1C  04/25/2016  . OPHTHALMOLOGY EXAM  06/25/2016  . URINE MICROALBUMIN  10/23/2016  . COLONOSCOPY  05/19/2017  . TETANUS/TDAP  06/30/2023  . ZOSTAVAX  Completed  . Hepatitis C Screening  Completed  . PNA vac Low Risk Adult  Completed      Plan:     I have personally reviewed and addressed the Medicare Annual Wellness questionnaire and have noted the following in the patient's chart:  A. Medical and social history B. Use of alcohol, tobacco or illicit drugs  C. Current medications and supplements D. Functional ability and status E.  Nutritional status F.  Physical activity G. Advance directives H. List of other physicians I.  Hospitalizations, surgeries, and ER visits in previous 12 months J.  [place] to include hearing, vision,  cognitive, depression L. Referrals and appointments - none  In addition, I have reviewed and discussed with patient certain preventive protocols, quality metrics, and best practice recommendations. A written personalized care plan for preventive services as well as general preventive health recommendations were provided to patient.  See attached scanned questionnaire for additional information.   Signed,   Lindell Noe, MHA, BS, LPN Health Advisor [id number]

## 2015-10-25 LAB — HEPATITIS C ANTIBODY: HCV Ab: NEGATIVE

## 2015-11-02 ENCOUNTER — Other Ambulatory Visit: Payer: Self-pay

## 2015-11-02 MED ORDER — GLIPIZIDE 5 MG PO TABS: 5.0000 mg | ORAL_TABLET | Freq: Every day | ORAL | Status: DC

## 2015-11-02 NOTE — Telephone Encounter (Signed)
Rx sent electronically.  

## 2015-11-14 ENCOUNTER — Ambulatory Visit (INDEPENDENT_AMBULATORY_CARE_PROVIDER_SITE_OTHER): Payer: Medicare Other | Admitting: Internal Medicine

## 2015-11-14 ENCOUNTER — Encounter: Payer: Self-pay | Admitting: Internal Medicine

## 2015-11-14 VITALS — BP 110/80 | HR 54 | Temp 97.9°F | Ht 71.0 in | Wt 220.0 lb

## 2015-11-14 DIAGNOSIS — K2100 Gastro-esophageal reflux disease with esophagitis, without bleeding: Secondary | ICD-10-CM

## 2015-11-14 DIAGNOSIS — E119 Type 2 diabetes mellitus without complications: Secondary | ICD-10-CM

## 2015-11-14 DIAGNOSIS — E785 Hyperlipidemia, unspecified: Secondary | ICD-10-CM | POA: Diagnosis not present

## 2015-11-14 LAB — HM DIABETES FOOT EXAM

## 2015-11-14 MED ORDER — GLUCOSE BLOOD VI STRP: ORAL_STRIP | Status: DC

## 2015-11-14 NOTE — Progress Notes (Signed)
Pre visit review using our clinic review tool, if applicable. No additional management support is needed unless otherwise documented below in the visit note. 

## 2015-11-14 NOTE — Assessment & Plan Note (Signed)
Symptoms are better Off the omeprazole for now

## 2015-11-14 NOTE — Progress Notes (Signed)
Subjective:    Patient ID: Alexis Sharp, male    DOB: [birth year], 70 y.o.   MRN: [id number]  HPI Here for follow up of diabetes and other chronic health conditions  Reviewed labs Overall doing well Cholesterol is still not at goal--but he reports he isn't taking the med He had muscle aches on 10mg  atorvastatin  Frustrated by not being able to lose weight Plays tennis once a week and rides bike 4 days per week (67miles per) Tries to be careful with diet--- but eats fast food many lunches Uses phone app at times  Checks sugars daily in AM Usually under 120 No numbness or sores in feet. Some pain--but relates to "age"--not burning (better with better shoes)  No problems with reflux Off the omeprazole now  Current Outpatient Prescriptions on File Prior to Visit  Medication Sig Dispense Refill  . aspirin 81 MG chewable tablet Chew 81 mg by mouth daily.      . bimatoprost (LUMIGAN) 0.01 % SOLN 1 drop at bedtime.    Marland Kitchen glipiZIDE (GLUCOTROL) 5 MG tablet Take 1 tablet (5 mg total) by mouth daily. 90 tablet 3  . glucose blood (RELION PRIME TEST) test strip Use as instructed to test blood sugar once daily dx: E11.9 100 each 3  . Insulin Pen Needle (BD PEN NEEDLE NANO U/F) 32G X 4 MM MISC Use once daily to administer insulin dx: E11.9 90 each 3  . LANTUS SOLOSTAR 100 UNIT/ML Solostar Pen INJECT  24 UNITS SUBCUTANEOUSLY EVERY DAY  AT  10  PM 30 mL 0  . metFORMIN (GLUCOPHAGE) 500 MG tablet TAKE 1 TABLET THREE TIMES DAILY 270 tablet 0  . Multiple Vitamin (MULTIVITAMIN) capsule Take 1 capsule by mouth daily.      . timolol (TIMOPTIC) 0.5 % ophthalmic solution      No current facility-administered medications on file prior to visit.    Allergies  Allergen Reactions  . Codeine   . Pioglitazone     REACTION: achy  . Sitagliptin Phosphate     REACTION: muscle aches  . Crestor [Rosuvastatin]     Muscle aching    Past Medical History  Diagnosis Date  . Diabetes mellitus   .  Hyperlipidemia   . Glaucoma   . Migraines   . Reflux esophagitis     Past Surgical History  Procedure Laterality Date  . Umbilical hernia repair  2002  . Cataract extraction, bilateral      Family History  Problem Relation Age of Onset  . Diabetes Mother   . Diabetes Sister   . Heart disease Brother   . Diabetes Brother   . Heart disease Brother   . Diabetes Brother   . Diabetes Brother   . Diabetes Sister   . Diabetes Sister     Social History   Social History  . Marital Status: Married    Spouse Name: N/A  . Number of Children: 2  . Years of Education: N/A   Occupational History  . semi retired Museum/gallery curator asst,  does Software engineer    Social History Main Topics  . Smoking status: Former Research scientist (life sciences)  . Smokeless tobacco: Never Used  . Alcohol Use: No  . Drug Use: No  . Sexual Activity: Yes   Other Topics Concern  . Not on file   Social History Narrative   No living will   No health care POA---okay with wife making decisions   Would accept resuscitation   Not sure about  tube feeds   Review of Systems  Sleeps well Appetite is fine Weight about the same     Objective:   Physical Exam  Constitutional: He appears well-developed. No distress.  Neck: Normal range of motion. Neck supple. No thyromegaly present.  Cardiovascular: Normal rate, regular rhythm, normal heart sounds and intact distal pulses.  Exam reveals no gallop.   No murmur heard. Pulmonary/Chest: Effort normal and breath sounds normal. No respiratory distress. He has no wheezes. He has no rales.  Musculoskeletal: He exhibits no edema or tenderness.  Lymphadenopathy:    He has no cervical adenopathy.  Neurological:  Normal sensation in feet  Skin: No rash noted. No erythema.  No foot lesions  Psychiatric: He has a normal mood and affect. His behavior is normal.          Assessment & Plan:

## 2015-11-14 NOTE — Assessment & Plan Note (Signed)
Intolerant of statins Doesn't believe in them and doesn't want to take Will not try more for now

## 2015-11-14 NOTE — Assessment & Plan Note (Signed)
Good control Discussed lifestyle improvements Lab Results  Component Value Date   HGBA1C 6.9* 10/24/2015

## 2015-11-19 ENCOUNTER — Other Ambulatory Visit: Payer: Self-pay | Admitting: Internal Medicine

## 2015-11-27 ENCOUNTER — Other Ambulatory Visit: Payer: Self-pay | Admitting: Internal Medicine

## 2015-12-03 DIAGNOSIS — R05 Cough: Secondary | ICD-10-CM | POA: Diagnosis not present

## 2015-12-05 ENCOUNTER — Other Ambulatory Visit: Payer: Self-pay | Admitting: Internal Medicine

## 2015-12-19 DIAGNOSIS — J301 Allergic rhinitis due to pollen: Secondary | ICD-10-CM | POA: Diagnosis not present

## 2015-12-19 DIAGNOSIS — R0989 Other specified symptoms and signs involving the circulatory and respiratory systems: Secondary | ICD-10-CM | POA: Diagnosis not present

## 2015-12-29 DIAGNOSIS — R05 Cough: Secondary | ICD-10-CM | POA: Diagnosis not present

## 2016-01-04 DIAGNOSIS — L820 Inflamed seborrheic keratosis: Secondary | ICD-10-CM | POA: Diagnosis not present

## 2016-01-04 DIAGNOSIS — L570 Actinic keratosis: Secondary | ICD-10-CM | POA: Diagnosis not present

## 2016-01-04 DIAGNOSIS — L578 Other skin changes due to chronic exposure to nonionizing radiation: Secondary | ICD-10-CM | POA: Diagnosis not present

## 2016-01-04 DIAGNOSIS — L821 Other seborrheic keratosis: Secondary | ICD-10-CM | POA: Diagnosis not present

## 2016-01-04 DIAGNOSIS — D235 Other benign neoplasm of skin of trunk: Secondary | ICD-10-CM | POA: Diagnosis not present

## 2016-01-04 DIAGNOSIS — D239 Other benign neoplasm of skin, unspecified: Secondary | ICD-10-CM

## 2016-01-04 DIAGNOSIS — D485 Neoplasm of uncertain behavior of skin: Secondary | ICD-10-CM | POA: Diagnosis not present

## 2016-01-04 DIAGNOSIS — L219 Seborrheic dermatitis, unspecified: Secondary | ICD-10-CM | POA: Diagnosis not present

## 2016-01-04 DIAGNOSIS — Z1283 Encounter for screening for malignant neoplasm of skin: Secondary | ICD-10-CM | POA: Diagnosis not present

## 2016-01-04 DIAGNOSIS — L739 Follicular disorder, unspecified: Secondary | ICD-10-CM | POA: Diagnosis not present

## 2016-01-04 HISTORY — DX: Other benign neoplasm of skin, unspecified: D23.9

## 2016-02-06 DIAGNOSIS — R05 Cough: Secondary | ICD-10-CM | POA: Diagnosis not present

## 2016-02-06 DIAGNOSIS — J309 Allergic rhinitis, unspecified: Secondary | ICD-10-CM | POA: Diagnosis not present

## 2016-02-25 DIAGNOSIS — D485 Neoplasm of uncertain behavior of skin: Secondary | ICD-10-CM | POA: Diagnosis not present

## 2016-02-25 DIAGNOSIS — L905 Scar conditions and fibrosis of skin: Secondary | ICD-10-CM | POA: Diagnosis not present

## 2016-03-05 DIAGNOSIS — H401131 Primary open-angle glaucoma, bilateral, mild stage: Secondary | ICD-10-CM | POA: Diagnosis not present

## 2016-03-05 LAB — HM DIABETES EYE EXAM

## 2016-03-12 ENCOUNTER — Encounter: Payer: Self-pay | Admitting: Internal Medicine

## 2016-03-12 DIAGNOSIS — R0602 Shortness of breath: Secondary | ICD-10-CM | POA: Diagnosis not present

## 2016-03-17 DIAGNOSIS — R05 Cough: Secondary | ICD-10-CM | POA: Diagnosis not present

## 2016-03-17 DIAGNOSIS — J309 Allergic rhinitis, unspecified: Secondary | ICD-10-CM | POA: Diagnosis not present

## 2016-03-17 DIAGNOSIS — Z23 Encounter for immunization: Secondary | ICD-10-CM | POA: Diagnosis not present

## 2016-03-17 DIAGNOSIS — R0602 Shortness of breath: Secondary | ICD-10-CM | POA: Diagnosis not present

## 2016-04-18 DIAGNOSIS — J019 Acute sinusitis, unspecified: Secondary | ICD-10-CM | POA: Diagnosis not present

## 2016-04-18 DIAGNOSIS — J208 Acute bronchitis due to other specified organisms: Secondary | ICD-10-CM | POA: Diagnosis not present

## 2016-04-18 DIAGNOSIS — B9689 Other specified bacterial agents as the cause of diseases classified elsewhere: Secondary | ICD-10-CM | POA: Diagnosis not present

## 2016-05-02 ENCOUNTER — Encounter: Payer: Self-pay | Admitting: Internal Medicine

## 2016-05-02 ENCOUNTER — Ambulatory Visit (INDEPENDENT_AMBULATORY_CARE_PROVIDER_SITE_OTHER): Payer: Medicare Other | Admitting: Internal Medicine

## 2016-05-02 VITALS — BP 146/94 | HR 55 | Temp 98.0°F | Wt 221.0 lb

## 2016-05-02 DIAGNOSIS — J0101 Acute recurrent maxillary sinusitis: Secondary | ICD-10-CM

## 2016-05-02 MED ORDER — GLUCOSE BLOOD VI STRP: ORAL_STRIP | 4 refills | Status: DC

## 2016-05-02 MED ORDER — ONETOUCH ULTRA SYSTEM W/DEVICE KIT: 1.0000 | PACK | Freq: Once | 0 refills | Status: AC

## 2016-05-02 MED ORDER — ONETOUCH DELICA LANCETS FINE MISC: 3 refills | Status: AC

## 2016-05-02 MED ORDER — AZITHROMYCIN 250 MG PO TABS: ORAL_TABLET | ORAL | 0 refills | Status: DC

## 2016-05-02 NOTE — Assessment & Plan Note (Signed)
No lower respiratory tract signs Partial improvement with doxy Didn't tolerate augmentin Will try z-pak (he tolerates)

## 2016-05-02 NOTE — Progress Notes (Signed)
Subjective:    Patient ID: Alexis Sharp, male    DOB: 1946-05-18, 70 y.o.   MRN: [id number]  HPI Here with wife for ongoing respiratory symptoms Reviewed urgent care visit from 2 weeks ago Seemed better then got worse again (not back to normal even after the antibiotic)  Bad coughing spells now Sinus congestion also--Neti pot helped a little Now with some purulent mucus Lots of sneezing also Thought it might be allergies--but zyrtec, etc not helping  No fever No sweats or chills but now "doesn't feel good"----weak and fatigued Not SOB Some rumbling in voice but doesn't seem to be in his chest  Current Outpatient Prescriptions on File Prior to Visit  Medication Sig Dispense Refill  . aspirin 81 MG chewable tablet Chew 81 mg by mouth daily.      . BD PEN NEEDLE NANO U/F 32G X 4 MM MISC USE ONE TIME DAILY TO ADMINISTER INSULIN  90 each 3  . bimatoprost (LUMIGAN) 0.01 % SOLN 1 drop at bedtime.    Marland Kitchen glipiZIDE (GLUCOTROL) 5 MG tablet Take 1 tablet (5 mg total) by mouth daily. 90 tablet 3  . LANTUS SOLOSTAR 100 UNIT/ML Solostar Pen INJECT  24 UNITS SUBCUTANEOUSLY EVERY DAY  AT  10PM (NEED MD APPOINTMENT) 30 mL 3  . metFORMIN (GLUCOPHAGE) 500 MG tablet TAKE 1 TABLET THREE TIMES DAILY 270 tablet 3  . Multiple Vitamin (MULTIVITAMIN) capsule Take 1 capsule by mouth daily.      . timolol (TIMOPTIC) 0.5 % ophthalmic solution      No current facility-administered medications on file prior to visit.     Allergies  Allergen Reactions  . Codeine   . Pioglitazone     REACTION: achy  . Sitagliptin Phosphate     REACTION: muscle aches  . Atorvastatin Other (See Comments)    myalgia  . Crestor [Rosuvastatin]     Muscle aching    Past Medical History:  Diagnosis Date  . Diabetes mellitus   . Glaucoma   . Hyperlipidemia   . Migraines   . Reflux esophagitis     Past Surgical History:  Procedure Laterality Date  . CATARACT EXTRACTION, BILATERAL    . UMBILICAL HERNIA REPAIR  2002      Family History  Problem Relation Age of Onset  . Diabetes Mother   . Diabetes Sister   . Heart disease Brother   . Diabetes Brother   . Heart disease Brother   . Diabetes Brother   . Diabetes Brother   . Diabetes Sister   . Diabetes Sister     Social History   Social History  . Marital status: Married    Spouse name: N/A  . Number of children: 2  . Years of education: N/A   Occupational History  . semi retired Museum/gallery curator asst,  does Pension scheme manager Employed   Social History Main Topics  . Smoking status: Former Research scientist (life sciences)  . Smokeless tobacco: Never Used  . Alcohol use No  . Drug use: No  . Sexual activity: Yes   Other Topics Concern  . Not on file   Social History Narrative   No living will   No health care POA---okay with wife making decisions   Would accept resuscitation   Not sure about tube feeds   Review of Systems  No rash No vomiting or diarrhea Appetite okay     Objective:   Physical Exam  Constitutional: He appears well-developed and well-nourished. No distress.  HENT:  Mouth/Throat: Oropharynx is clear and moist. No oropharyngeal exudate.  No sinus tenderness TMs normal Marked nasal swelling  Neck: Normal range of motion. Neck supple. No thyromegaly present.  Pulmonary/Chest: Effort normal and breath sounds normal. No respiratory distress. He has no wheezes. He has no rales.  Lymphadenopathy:    He has no cervical adenopathy.          Assessment & Plan:

## 2016-05-02 NOTE — Progress Notes (Signed)
Pre visit review using our clinic review tool, if applicable. No additional management support is needed unless otherwise documented below in the visit note. 

## 2016-05-14 ENCOUNTER — Ambulatory Visit (INDEPENDENT_AMBULATORY_CARE_PROVIDER_SITE_OTHER): Payer: Medicare Other | Admitting: Internal Medicine

## 2016-05-14 ENCOUNTER — Ambulatory Visit (INDEPENDENT_AMBULATORY_CARE_PROVIDER_SITE_OTHER)
Admission: RE | Admit: 2016-05-14 | Discharge: 2016-05-14 | Disposition: A | Discharge status: Home / Self Care | Payer: Medicare Other | Source: Ambulatory Visit | Attending: Internal Medicine | Admitting: Internal Medicine

## 2016-05-14 ENCOUNTER — Encounter: Payer: Self-pay | Admitting: Internal Medicine

## 2016-05-14 VITALS — BP 132/84 | HR 60 | Temp 98.2°F | Wt 222.0 lb

## 2016-05-14 DIAGNOSIS — E119 Type 2 diabetes mellitus without complications: Secondary | ICD-10-CM | POA: Diagnosis not present

## 2016-05-14 DIAGNOSIS — E785 Hyperlipidemia, unspecified: Secondary | ICD-10-CM

## 2016-05-14 DIAGNOSIS — Z794 Long term (current) use of insulin: Secondary | ICD-10-CM | POA: Diagnosis not present

## 2016-05-14 DIAGNOSIS — M25552 Pain in left hip: Secondary | ICD-10-CM | POA: Diagnosis not present

## 2016-05-14 DIAGNOSIS — M160 Bilateral primary osteoarthritis of hip: Secondary | ICD-10-CM | POA: Diagnosis not present

## 2016-05-14 LAB — HEMOGLOBIN A1C: HEMOGLOBIN A1C: 7.3 % — AB (ref 4.6–6.5)

## 2016-05-14 NOTE — Assessment & Plan Note (Signed)
Seems to still have good control Will check labs 

## 2016-05-14 NOTE — Progress Notes (Signed)
Pre visit review using our clinic review tool, if applicable. No additional management support is needed unless otherwise documented below in the visit note. 

## 2016-05-14 NOTE — Assessment & Plan Note (Signed)
Intolerant of statins Discussed improved lifestyle

## 2016-05-14 NOTE — Progress Notes (Signed)
Subjective:    Patient ID: Alexis Sharp, male    DOB: 1946/04/26, 70 y.o.   MRN: [id number]  HPI Here for follow up of diabetes  His sinus infection seems better  He notes some pain in hips Notices pain especially in bed-- aches in left hip if internally rotates Now having some pain on other side No pain after he gets up and is moving--does have some cramps at first till he loosens up No pain with walking--wife notices change in walking Tylenol usually helps things--but hasn't tried it  Checks sugars every morning Usually 85-140 No sig hypoglycemic reactions No sensory changes in feet  Current Outpatient Prescriptions on File Prior to Visit  Medication Sig Dispense Refill  . aspirin 81 MG chewable tablet Chew 81 mg by mouth daily.      . BD PEN NEEDLE NANO U/F 32G X 4 MM MISC USE ONE TIME DAILY TO ADMINISTER INSULIN  90 each 3  . bimatoprost (LUMIGAN) 0.01 % SOLN 1 drop at bedtime.    Marland Kitchen glipiZIDE (GLUCOTROL) 5 MG tablet Take 1 tablet (5 mg total) by mouth daily. 90 tablet 3  . glucose blood (ONE TOUCH TEST STRIPS) test strip Use to check blood sugar once a day. Dx Code E11.9 100 each 4  . LANTUS SOLOSTAR 100 UNIT/ML Solostar Pen INJECT  24 UNITS SUBCUTANEOUSLY EVERY DAY  AT  10PM (NEED MD APPOINTMENT) 30 mL 3  . metFORMIN (GLUCOPHAGE) 500 MG tablet TAKE 1 TABLET THREE TIMES DAILY 270 tablet 3  . Multiple Vitamin (MULTIVITAMIN) capsule Take 1 capsule by mouth daily.      Glory Rosebush DELICA LANCETS FINE MISC Use to check blood sugar once a day. Dx Code E11.9 100 each 3  . timolol (TIMOPTIC) 0.5 % ophthalmic solution      No current facility-administered medications on file prior to visit.     Allergies  Allergen Reactions  . Codeine   . Pioglitazone     REACTION: achy  . Sitagliptin Phosphate     REACTION: muscle aches  . Atorvastatin Other (See Comments)    myalgia  . Crestor [Rosuvastatin]     Muscle aching    Past Medical History:  Diagnosis Date  . Diabetes  mellitus   . Glaucoma   . Hyperlipidemia   . Migraines   . Reflux esophagitis     Past Surgical History:  Procedure Laterality Date  . CATARACT EXTRACTION, BILATERAL    . UMBILICAL HERNIA REPAIR  2002    Family History  Problem Relation Age of Onset  . Diabetes Mother   . Diabetes Sister   . Heart disease Brother   . Diabetes Brother   . Heart disease Brother   . Diabetes Brother   . Diabetes Brother   . Diabetes Sister   . Diabetes Sister     Social History   Social History  . Marital status: Married    Spouse name: N/A  . Number of children: 2  . Years of education: N/A   Occupational History  . semi retired Museum/gallery curator asst,  does Pension scheme manager Employed   Social History Main Topics  . Smoking status: Former Research scientist (life sciences)  . Smokeless tobacco: Never Used  . Alcohol use No  . Drug use: No  . Sexual activity: Yes   Other Topics Concern  . Not on file   Social History Narrative   No living will   No health care POA---okay with wife making decisions  Would accept resuscitation   Not sure about tube feeds   Review of Systems New commitment to improve diet Weight stable now Appetite is okay    Objective:   Physical Exam  Constitutional: He appears well-developed and well-nourished. No distress.  Neck: Normal range of motion. Neck supple. No thyromegaly present.  Cardiovascular: Normal rate, regular rhythm, normal heart sounds and intact distal pulses.  Exam reveals no gallop.   No murmur heard. Pulmonary/Chest: Effort normal and breath sounds normal. No respiratory distress. He has no wheezes. He has no rales.  Musculoskeletal:  Markedly limited internal rotation left hip--right only mildly  Lymphadenopathy:    He has no cervical adenopathy.  Neurological:  Normal gait  Skin:  No skin lesions  Psychiatric: He has a normal mood and affect. His behavior is normal.          Assessment & Plan:

## 2016-05-14 NOTE — Assessment & Plan Note (Signed)
Exam suggests sig osteoarthritis--but no sig limitation and only pain at night Suggested tylenol and will check x-ray

## 2016-06-23 DIAGNOSIS — M9903 Segmental and somatic dysfunction of lumbar region: Secondary | ICD-10-CM | POA: Diagnosis not present

## 2016-06-23 DIAGNOSIS — S335XXA Sprain of ligaments of lumbar spine, initial encounter: Secondary | ICD-10-CM | POA: Diagnosis not present

## 2016-06-24 ENCOUNTER — Encounter: Payer: Self-pay | Admitting: Internal Medicine

## 2016-06-25 MED ORDER — INSULIN GLARGINE 100 UNIT/ML SOLOSTAR PEN: PEN_INJECTOR | SUBCUTANEOUS | 3 refills | Status: DC

## 2016-06-25 MED ORDER — INSULIN PEN NEEDLE 32G X 4 MM MISC: 3 refills | Status: DC

## 2016-06-25 MED ORDER — GLIPIZIDE 5 MG PO TABS: 5.0000 mg | ORAL_TABLET | Freq: Every day | ORAL | 3 refills | Status: DC

## 2016-06-25 MED ORDER — METFORMIN HCL 500 MG PO TABS: 500.0000 mg | ORAL_TABLET | Freq: Three times a day (TID) | ORAL | 3 refills | Status: DC

## 2016-06-27 DIAGNOSIS — M9903 Segmental and somatic dysfunction of lumbar region: Secondary | ICD-10-CM | POA: Diagnosis not present

## 2016-06-27 DIAGNOSIS — S335XXA Sprain of ligaments of lumbar spine, initial encounter: Secondary | ICD-10-CM | POA: Diagnosis not present

## 2016-07-04 DIAGNOSIS — H401131 Primary open-angle glaucoma, bilateral, mild stage: Secondary | ICD-10-CM | POA: Diagnosis not present

## 2016-07-25 DIAGNOSIS — L570 Actinic keratosis: Secondary | ICD-10-CM | POA: Diagnosis not present

## 2016-07-25 DIAGNOSIS — L578 Other skin changes due to chronic exposure to nonionizing radiation: Secondary | ICD-10-CM | POA: Diagnosis not present

## 2016-07-25 DIAGNOSIS — L820 Inflamed seborrheic keratosis: Secondary | ICD-10-CM | POA: Diagnosis not present

## 2016-07-25 DIAGNOSIS — L821 Other seborrheic keratosis: Secondary | ICD-10-CM | POA: Diagnosis not present

## 2016-07-25 DIAGNOSIS — L739 Follicular disorder, unspecified: Secondary | ICD-10-CM | POA: Diagnosis not present

## 2016-07-26 DIAGNOSIS — J019 Acute sinusitis, unspecified: Secondary | ICD-10-CM | POA: Diagnosis not present

## 2016-08-01 DIAGNOSIS — H401131 Primary open-angle glaucoma, bilateral, mild stage: Secondary | ICD-10-CM | POA: Diagnosis not present

## 2016-08-08 DIAGNOSIS — S335XXA Sprain of ligaments of lumbar spine, initial encounter: Secondary | ICD-10-CM | POA: Diagnosis not present

## 2016-08-08 DIAGNOSIS — M9903 Segmental and somatic dysfunction of lumbar region: Secondary | ICD-10-CM | POA: Diagnosis not present

## 2016-08-19 ENCOUNTER — Telehealth: Payer: Self-pay

## 2016-08-19 NOTE — Telephone Encounter (Signed)
Alexis Sharp request refilll of onetouch test strips and delica lancets to [facility] [address]. Advised refills should already be available and to ck with pharmacy. Alexis Sharp voiced understanding.

## 2016-09-10 ENCOUNTER — Telehealth: Payer: Self-pay | Admitting: Internal Medicine

## 2016-09-10 NOTE — Telephone Encounter (Signed)
Pt dropped off DMV form to be filled out. Aware of possible charge. Left in RX tower [facility]

## 2016-09-10 NOTE — Telephone Encounter (Signed)
Spoke to pt. He said it is just for the diabetes. He is fine with getting them back next week.

## 2016-09-10 NOTE — Telephone Encounter (Signed)
Let him know that I won't be able to get to this till next week. Is there something else besides the diabetes that they would be concerned about? Recent citations or accidents??

## 2016-09-10 NOTE — Telephone Encounter (Signed)
DMV forms placed in Dr Alla German Inbox. Last [facility]W 10-24-15 Last 2 OVs 11-14-15 and 05-14-16 for DM F/U

## 2016-09-16 DIAGNOSIS — Z7689 Persons encountering health services in other specified circumstances: Secondary | ICD-10-CM

## 2016-09-16 NOTE — Telephone Encounter (Signed)
I spoke to patient's wife and notified her form is ready for pick up and of $20 charge.

## 2016-09-16 NOTE — Telephone Encounter (Signed)
Form done $20 charge 

## 2016-09-19 DIAGNOSIS — H401131 Primary open-angle glaucoma, bilateral, mild stage: Secondary | ICD-10-CM | POA: Diagnosis not present

## 2016-09-19 LAB — HM DIABETES EYE EXAM

## 2016-10-07 ENCOUNTER — Encounter: Payer: Self-pay | Admitting: Primary Care

## 2016-10-07 ENCOUNTER — Ambulatory Visit (INDEPENDENT_AMBULATORY_CARE_PROVIDER_SITE_OTHER): Payer: PPO | Admitting: Primary Care

## 2016-10-07 VITALS — BP 134/84 | HR 62 | Temp 97.9°F | Ht 71.0 in | Wt 223.0 lb

## 2016-10-07 DIAGNOSIS — L291 Pruritus scroti: Secondary | ICD-10-CM | POA: Diagnosis not present

## 2016-10-07 DIAGNOSIS — R21 Rash and other nonspecific skin eruption: Secondary | ICD-10-CM

## 2016-10-07 LAB — POC URINALSYSI DIPSTICK (AUTOMATED)
Bilirubin, UA: NEGATIVE
Blood, UA: NEGATIVE
GLUCOSE UA: NEGATIVE
Ketones, UA: NEGATIVE
LEUKOCYTES UA: NEGATIVE
NITRITE UA: NEGATIVE
Protein, UA: NEGATIVE
Spec Grav, UA: 1.02 (ref 1.010–1.025)
UROBILINOGEN UA: NEGATIVE U/dL — AB
pH, UA: 6 (ref 5.0–8.0)

## 2016-10-07 MED ORDER — KETOCONAZOLE 2 % EX CREA: TOPICAL_CREAM | CUTANEOUS | 0 refills | Status: DC

## 2016-10-07 NOTE — Progress Notes (Signed)
Pre visit review using our clinic review tool, if applicable. No additional management support is needed unless otherwise documented below in the visit note. 

## 2016-10-07 NOTE — Patient Instructions (Signed)
Your urine does not show evidence of infection.  Apply the ketoconazole cream twice daily as needed for 1 week or until rash clears.  Consider using Gold Bond Powder during warm Summer months to prevent rash.  It was a pleasure meeting you!

## 2016-10-07 NOTE — Progress Notes (Signed)
Subjective:    Patient ID: Alexis Sharp, male    DOB: Nov 09, 1945, 71 y.o.   MRN: [id number]  HPI  Alexis Sharp is a 71 year old male with a history of type 2 diabetes who presents today with a chief complaint of dysuria. He also reports urinary frequency. His symptoms began 2 weeks ago. He has been drinking a lot of coffee recently, little water. He started drinking some water two days ago and has noticed a slight improvement. He denies hematuria, fevers, flank pain, nausea, difficulty urinating. He's not taken anything OTC for his symptoms.  He's also noted itching and burning to the scrotum and where his scrotum touches his legs. He was prescribed a lotion in the past by his PCP that helped with these symptoms. He denies rash, scrotal/penile swelling and discharge.   Review of Systems  Constitutional: Negative for fever.  Gastrointestinal: Negative for abdominal pain and nausea.  Genitourinary: Positive for dysuria and frequency. Negative for flank pain, hematuria, penile swelling and scrotal swelling.  Skin: Negative for color change and rash.       Itching to groin       Past Medical History:  Diagnosis Date  . Diabetes mellitus   . Glaucoma   . Hyperlipidemia   . Migraines   . Reflux esophagitis      Social History   Social History  . Marital status: Married    Spouse name: N/A  . Number of children: 2  . Years of education: N/A   Occupational History  . semi retired Museum/gallery curator asst,  does Pension scheme manager Employed   Social History Main Topics  . Smoking status: Former Research scientist (life sciences)  . Smokeless tobacco: Never Used  . Alcohol use No  . Drug use: No  . Sexual activity: Yes   Other Topics Concern  . Not on file   Social History Narrative   No living will   No health care POA---okay with wife making decisions   Would accept resuscitation   Not sure about tube feeds    Past Surgical History:  Procedure Laterality Date  . CATARACT EXTRACTION, BILATERAL      . UMBILICAL HERNIA REPAIR  2002    Family History  Problem Relation Age of Onset  . Diabetes Mother   . Diabetes Sister   . Heart disease Brother   . Diabetes Brother   . Heart disease Brother   . Diabetes Brother   . Diabetes Brother   . Diabetes Sister   . Diabetes Sister     Allergies  Allergen Reactions  . Codeine   . Pioglitazone     REACTION: achy  . Sitagliptin Phosphate     REACTION: muscle aches  . Atorvastatin Other (See Comments)    myalgia  . Crestor [Rosuvastatin]     Muscle aching    Current Outpatient Prescriptions on File Prior to Visit  Medication Sig Dispense Refill  . aspirin 81 MG chewable tablet Chew 81 mg by mouth daily.      . bimatoprost (LUMIGAN) 0.01 % SOLN 1 drop at bedtime.    Marland Kitchen glipiZIDE (GLUCOTROL) 5 MG tablet Take 1 tablet (5 mg total) by mouth daily. 90 tablet 3  . glucose blood (ONE TOUCH TEST STRIPS) test strip Use to check blood sugar once a day. Dx Code E11.9 100 each 4  . Insulin Glargine (LANTUS SOLOSTAR) 100 UNIT/ML Solostar Pen INJECT  24 UNITS SUBCUTANEOUSLY EVERY DAY  AT  10PM 30 mL 3  .  Insulin Pen Needle (BD PEN NEEDLE NANO U/F) 32G X 4 MM MISC USE ONE TIME DAILY TO ADMINISTER INSULIN 90 each 3  . metFORMIN (GLUCOPHAGE) 500 MG tablet Take 1 tablet (500 mg total) by mouth 3 (three) times daily. 270 tablet 3  . Multiple Vitamin (MULTIVITAMIN) capsule Take 1 capsule by mouth daily.      Glory Rosebush DELICA LANCETS FINE MISC Use to check blood sugar once a day. Dx Code E11.9 100 each 3  . timolol (TIMOPTIC) 0.5 % ophthalmic solution      No current facility-administered medications on file prior to visit.     BP 134/84   Pulse 62   Temp 97.9 F (36.6 C) (Oral)   Ht 5\' 11"  (1.803 m)   Wt 223 lb (101.2 kg)   SpO2 98%   BMI 31.10 kg/m    Objective:   Physical Exam  Constitutional: He appears well-nourished.  Neck: Neck supple.  Cardiovascular: Normal rate and regular rhythm.   Pulmonary/Chest: Effort normal and breath  sounds normal.  Genitourinary: Penis normal. Right testis shows no mass, no swelling and no tenderness. Left testis shows no mass, no swelling and no tenderness.  Skin: Skin is warm and dry. Rash noted. There is erythema.  Mild fungal appearing rash to bilateral groin.          Assessment & Plan:  Dysuria/Groin Itching:  Dysuria and frequency x 2 weeks. Groin itching with warm sensation to scrotum x 2 weeks. Exam today with evidence of rash fungal appearing rash to site of symptoms.  UA: Negative. Rx for ketoconazole lotion sent to pharmacy as this was very effective in the past. Discussed use of Gold bond power during warmer summer months. Follow up PRN.  Sheral Flow, NP

## 2016-10-24 ENCOUNTER — Encounter: Payer: Self-pay | Admitting: Internal Medicine

## 2016-10-24 ENCOUNTER — Ambulatory Visit (INDEPENDENT_AMBULATORY_CARE_PROVIDER_SITE_OTHER): Payer: PPO | Admitting: Internal Medicine

## 2016-10-24 VITALS — BP 134/82 | HR 59 | Temp 97.7°F | Wt 222.0 lb

## 2016-10-24 DIAGNOSIS — E119 Type 2 diabetes mellitus without complications: Secondary | ICD-10-CM

## 2016-10-24 DIAGNOSIS — Z794 Long term (current) use of insulin: Secondary | ICD-10-CM | POA: Diagnosis not present

## 2016-10-24 DIAGNOSIS — E785 Hyperlipidemia, unspecified: Secondary | ICD-10-CM | POA: Diagnosis not present

## 2016-10-24 LAB — CBC WITH DIFFERENTIAL/PLATELET
BASOS ABS: 0.1 10*3/uL (ref 0.0–0.1)
Basophils Relative: 0.8 % (ref 0.0–3.0)
EOS ABS: 0.1 10*3/uL (ref 0.0–0.7)
EOS PCT: 1.7 % (ref 0.0–5.0)
HEMATOCRIT: 43.2 % (ref 39.0–52.0)
HEMOGLOBIN: 14.9 g/dL (ref 13.0–17.0)
LYMPHS ABS: 2.3 10*3/uL (ref 0.7–4.0)
LYMPHS PCT: 35.6 % (ref 12.0–46.0)
MCHC: 34.4 g/dL (ref 30.0–36.0)
MCV: 95.6 fl (ref 78.0–100.0)
MONOS PCT: 10.3 % (ref 3.0–12.0)
Monocytes Absolute: 0.7 10*3/uL (ref 0.1–1.0)
NEUTROS ABS: 3.4 10*3/uL (ref 1.4–7.7)
Neutrophils Relative %: 51.6 % (ref 43.0–77.0)
Platelets: 224 10*3/uL (ref 150.0–400.0)
RBC: 4.51 Mil/uL (ref 4.22–5.81)
RDW: 13.4 % (ref 11.5–15.5)
WBC: 6.5 10*3/uL (ref 4.0–10.5)

## 2016-10-24 LAB — COMPREHENSIVE METABOLIC PANEL
ALT: 20 U/L (ref 0–53)
AST: 20 U/L (ref 0–37)
Albumin: 4.7 g/dL (ref 3.5–5.2)
Alkaline Phosphatase: 47 U/L (ref 39–117)
BILIRUBIN TOTAL: 0.7 mg/dL (ref 0.2–1.2)
BUN: 17 mg/dL (ref 6–23)
CHLORIDE: 103 meq/L (ref 96–112)
CO2: 27 meq/L (ref 19–32)
Calcium: 9.8 mg/dL (ref 8.4–10.5)
Creatinine, Ser: 1.02 mg/dL (ref 0.40–1.50)
GFR: 76.96 mL/min (ref >60.00)
GLUCOSE: 95 mg/dL (ref 70–99)
POTASSIUM: 4.2 meq/L (ref 3.5–5.1)
Sodium: 138 mEq/L (ref 135–145)
Total Protein: 7.3 g/dL (ref 6.0–8.3)

## 2016-10-24 LAB — MICROALBUMIN / CREATININE URINE RATIO
Creatinine,U: 75 mg/dL
Microalb Creat Ratio: 1.3 mg/g (ref 0.0–30.0)
Microalb, Ur: 1 mg/dL (ref 0.0–1.9)

## 2016-10-24 LAB — HM DIABETES FOOT EXAM

## 2016-10-24 LAB — HEMOGLOBIN A1C: Hgb A1c MFr Bld: 6.7 % — ABNORMAL HIGH (ref 4.6–6.5)

## 2016-10-24 LAB — LIPID PANEL
CHOL/HDL RATIO: 5
CHOLESTEROL: 164 mg/dL (ref 0–200)
HDL: 35.4 mg/dL — AB (ref >39.00)
LDL CALC: 107 mg/dL — AB (ref 0–99)
NonHDL: 128.91
TRIGLYCERIDES: 112 mg/dL (ref 0.0–149.0)
VLDL: 22.4 mg/dL (ref 0.0–40.0)

## 2016-10-24 LAB — T4, FREE: Free T4: 0.76 ng/dL (ref 0.60–1.60)

## 2016-10-24 MED ORDER — TRIAMCINOLONE ACETONIDE 0.1 % EX LOTN: TOPICAL_LOTION | Freq: Three times a day (TID) | CUTANEOUS | 1 refills | Status: DC | PRN

## 2016-10-24 MED ORDER — GLIPIZIDE 5 MG PO TABS: 5.0000 mg | ORAL_TABLET | Freq: Two times a day (BID) | ORAL | 3 refills | Status: DC

## 2016-10-24 NOTE — Assessment & Plan Note (Signed)
He still prefers no statin

## 2016-10-24 NOTE — Assessment & Plan Note (Signed)
He is going to try glipizide bid and see if he can stop the insulin Will check labs now

## 2016-10-24 NOTE — Progress Notes (Signed)
Subjective:    Patient ID: Alexis Sharp, male    DOB: Sep 21, 1945, 71 y.o.   MRN: [id number]  HPI Here for follow up of diabetes  Doing okay Wonders about increasing the glipizide dose and cutting out the insulin Has tried 5mg  at night and no insulin and he did well with it Has been holding the glipizide-- if in low 100's. Can be under 100 occasionally---then he would get hypoglycemic reaction No sores, numbness or pain Regular with eye doctor  He doesn't want statin No chest pain No SOB No dizziness or syncope Trying to do regular exercise program  Current Outpatient Prescriptions on File Prior to Visit  Medication Sig Dispense Refill  . aspirin 81 MG chewable tablet Chew 81 mg by mouth daily.      . bimatoprost (LUMIGAN) 0.01 % SOLN 1 drop at bedtime.    Marland Kitchen glipiZIDE (GLUCOTROL) 5 MG tablet Take 1 tablet (5 mg total) by mouth daily. 90 tablet 3  . glucose blood (ONE TOUCH TEST STRIPS) test strip Use to check blood sugar once a day. Dx Code E11.9 100 each 4  . Insulin Glargine (LANTUS SOLOSTAR) 100 UNIT/ML Solostar Pen INJECT  24 UNITS SUBCUTANEOUSLY EVERY DAY  AT  10PM 30 mL 3  . Insulin Pen Needle (BD PEN NEEDLE NANO U/F) 32G X 4 MM MISC USE ONE TIME DAILY TO ADMINISTER INSULIN 90 each 3  . ketoconazole (NIZORAL) 2 % cream Apply topically twice daily for 1 week or until rash clears. 60 g 0  . metFORMIN (GLUCOPHAGE) 500 MG tablet Take 1 tablet (500 mg total) by mouth 3 (three) times daily. 270 tablet 3  . Multiple Vitamin (MULTIVITAMIN) capsule Take 1 capsule by mouth daily.      Glory Rosebush DELICA LANCETS FINE MISC Use to check blood sugar once a day. Dx Code E11.9 100 each 3  . timolol (TIMOPTIC) 0.5 % ophthalmic solution      No current facility-administered medications on file prior to visit.     Allergies  Allergen Reactions  . Codeine   . Pioglitazone     REACTION: achy  . Sitagliptin Phosphate     REACTION: muscle aches  . Atorvastatin Other (See Comments)   myalgia  . Crestor [Rosuvastatin]     Muscle aching    Past Medical History:  Diagnosis Date  . Diabetes mellitus   . Glaucoma   . Hyperlipidemia   . Migraines   . Reflux esophagitis     Past Surgical History:  Procedure Laterality Date  . CATARACT EXTRACTION, BILATERAL    . UMBILICAL HERNIA REPAIR  2002    Family History  Problem Relation Age of Onset  . Diabetes Mother   . Diabetes Sister   . Heart disease Brother   . Diabetes Brother   . Heart disease Brother   . Diabetes Brother   . Diabetes Brother   . Diabetes Sister   . Diabetes Sister     Social History   Social History  . Marital status: Married    Spouse name: N/A  . Number of children: 2  . Years of education: N/A   Occupational History  . semi retired Museum/gallery curator asst,  does Pension scheme manager Employed   Social History Main Topics  . Smoking status: Former Research scientist (life sciences)  . Smokeless tobacco: Never Used  . Alcohol use No  . Drug use: No  . Sexual activity: Yes   Other Topics Concern  . Not on file  Social History Narrative   No living will   No health care POA---okay with wife making decisions   Would accept resuscitation   Not sure about tube feeds   Review of Systems Weight is stable Appetite is okay Sleeps well Ongoing rash in groin--wants med he got before----triamcinolone    Objective:   Physical Exam  Constitutional: He appears well-nourished. No distress.  Neck: No thyromegaly present.  Cardiovascular: Normal rate, regular rhythm, normal heart sounds and intact distal pulses.  Exam reveals no gallop.   No murmur heard. Pulmonary/Chest: Effort normal and breath sounds normal. No respiratory distress. He has no wheezes. He has no rales.  Abdominal: Soft. There is no tenderness.  Musculoskeletal: He exhibits no edema or tenderness.  Lymphadenopathy:    He has no cervical adenopathy.  Neurological:  Normal sensation in feet  Skin:  No foot lesions Mild redness in right  groin          Assessment & Plan:

## 2016-12-02 ENCOUNTER — Ambulatory Visit (INDEPENDENT_AMBULATORY_CARE_PROVIDER_SITE_OTHER): Payer: PPO | Admitting: Internal Medicine

## 2016-12-02 ENCOUNTER — Encounter: Payer: Self-pay | Admitting: Internal Medicine

## 2016-12-02 VITALS — BP 124/80 | HR 52 | Temp 98.0°F | Wt 222.0 lb

## 2016-12-02 DIAGNOSIS — R200 Anesthesia of skin: Secondary | ICD-10-CM | POA: Diagnosis not present

## 2016-12-02 HISTORY — DX: Anesthesia of skin: R20.0

## 2016-12-02 LAB — VITAMIN B12: Vitamin B-12: 510 pg/mL (ref 211–911)

## 2016-12-02 NOTE — Progress Notes (Signed)
Subjective:    Patient ID: Alexis Sharp, male    DOB: [birth year], 71 y.o.   MRN: [id number]  HPI Here due to concern for groin issues Rash is mostly gone but has had heavy sweating and feels discomfort there when sleeping Has lost sensation in penis "Prickly feeling" in penis--but mostly along thighs or scrotum  Wears briefs Mostly feels supported Avoids tight briefs  Current Outpatient Prescriptions on File Prior to Visit  Medication Sig Dispense Refill  . aspirin 81 MG chewable tablet Chew 81 mg by mouth daily.      . bimatoprost (LUMIGAN) 0.01 % SOLN 1 drop at bedtime.    Marland Kitchen glipiZIDE (GLUCOTROL) 5 MG tablet Take 1 tablet (5 mg total) by mouth 2 (two) times daily before a meal. 180 tablet 3  . glucose blood (ONE TOUCH TEST STRIPS) test strip Use to check blood sugar once a day. Dx Code E11.9 100 each 4  . Insulin Glargine (LANTUS SOLOSTAR) 100 UNIT/ML Solostar Pen INJECT  24 UNITS SUBCUTANEOUSLY EVERY DAY  AT  10PM 30 mL 3  . Insulin Pen Needle (BD PEN NEEDLE NANO U/F) 32G X 4 MM MISC USE ONE TIME DAILY TO ADMINISTER INSULIN 90 each 3  . metFORMIN (GLUCOPHAGE) 500 MG tablet Take 1 tablet (500 mg total) by mouth 3 (three) times daily. 270 tablet 3  . Multiple Vitamin (MULTIVITAMIN) capsule Take 1 capsule by mouth daily.      Glory Rosebush DELICA LANCETS FINE MISC Use to check blood sugar once a day. Dx Code E11.9 100 each 3  . timolol (TIMOPTIC) 0.5 % ophthalmic solution      No current facility-administered medications on file prior to visit.     Allergies  Allergen Reactions  . Codeine   . Pioglitazone     REACTION: achy  . Sitagliptin Phosphate     REACTION: muscle aches  . Atorvastatin Other (See Comments)    myalgia  . Crestor [Rosuvastatin]     Muscle aching    Past Medical History:  Diagnosis Date  . Diabetes mellitus   . Glaucoma   . Hyperlipidemia   . Migraines   . Reflux esophagitis     Past Surgical History:  Procedure Laterality Date  . CATARACT  EXTRACTION, BILATERAL    . UMBILICAL HERNIA REPAIR  2002    Family History  Problem Relation Age of Onset  . Diabetes Mother   . Diabetes Sister   . Heart disease Brother   . Diabetes Brother   . Heart disease Brother   . Diabetes Brother   . Diabetes Brother   . Diabetes Sister   . Diabetes Sister     Social History   Social History  . Marital status: Married    Spouse name: N/A  . Number of children: 2  . Years of education: N/A   Occupational History  . semi retired Museum/gallery curator asst,  does Pension scheme manager Employed   Social History Main Topics  . Smoking status: Former Research scientist (life sciences)  . Smokeless tobacco: Never Used  . Alcohol use No  . Drug use: No  . Sexual activity: Yes   Other Topics Concern  . Not on file   Social History Narrative   No living will   No health care POA---okay with wife making decisions   Would accept resuscitation   Not sure about tube feeds   Review of Systems Voids fine No dysuria or hematuria Bowels are regular    Objective:  Physical Exam  Constitutional: He appears well-nourished. No distress.  Genitourinary:  Genitourinary Comments: Normal scrotum, penis, testes and tubes No tenderness, etc Rash is pretty much gone          Assessment & Plan:

## 2016-12-02 NOTE — Assessment & Plan Note (Signed)
Reassured--doesn't seem to be related to prostate Doubt this is diabetic  Will check B12 Discussed wearing athletic supporter for riding mower and extended car rides

## 2017-02-03 ENCOUNTER — Encounter: Payer: Self-pay | Admitting: Internal Medicine

## 2017-02-03 ENCOUNTER — Ambulatory Visit (INDEPENDENT_AMBULATORY_CARE_PROVIDER_SITE_OTHER): Payer: PPO | Admitting: Internal Medicine

## 2017-02-03 VITALS — BP 128/88 | HR 58 | Temp 97.5°F | Ht 71.0 in | Wt 221.8 lb

## 2017-02-03 DIAGNOSIS — K2100 Gastro-esophageal reflux disease with esophagitis, without bleeding: Secondary | ICD-10-CM

## 2017-02-03 DIAGNOSIS — E119 Type 2 diabetes mellitus without complications: Secondary | ICD-10-CM

## 2017-02-03 DIAGNOSIS — Z794 Long term (current) use of insulin: Secondary | ICD-10-CM | POA: Diagnosis not present

## 2017-02-03 DIAGNOSIS — Z8719 Personal history of other diseases of the digestive system: Secondary | ICD-10-CM

## 2017-02-03 DIAGNOSIS — Z7189 Other specified counseling: Secondary | ICD-10-CM | POA: Diagnosis not present

## 2017-02-03 DIAGNOSIS — Z0000 Encounter for general adult medical examination without abnormal findings: Secondary | ICD-10-CM | POA: Diagnosis not present

## 2017-02-03 DIAGNOSIS — E785 Hyperlipidemia, unspecified: Secondary | ICD-10-CM | POA: Diagnosis not present

## 2017-02-03 LAB — HEMOGLOBIN A1C: HEMOGLOBIN A1C: 6.6 % — AB (ref 4.6–6.5)

## 2017-02-03 NOTE — Assessment & Plan Note (Signed)
See social history Blank forms given and discussed

## 2017-02-03 NOTE — Assessment & Plan Note (Signed)
Lab Results  Component Value Date   LDLCALC 107 (H) 10/24/2016   Fairly good without statin (he prefers not to take)

## 2017-02-03 NOTE — Assessment & Plan Note (Signed)
No recent symptoms

## 2017-02-03 NOTE — Assessment & Plan Note (Signed)
Discussed taking standard dose of lantus instead of varying and skipping some days Will recheck A1c

## 2017-02-03 NOTE — Assessment & Plan Note (Signed)
I have personally reviewed the Medicare Annual Wellness questionnaire and have noted 1. The patient's medical and social history 2. Their use of alcohol, tobacco or illicit drugs 3. Their current medications and supplements 4. The patient's functional ability including ADL's, fall risks, home safety risks and hearing or visual             impairment. 5. Diet and physical activities 6. Evidence for depression or mood disorders  The patients weight, height, BMI and visual acuity have been recorded in the chart I have made referrals, counseling and provided education to the patient based review of the above and I have provided the pt with a written personalized care plan for preventive services.  I have provided you with a copy of your personalized plan for preventive services. Please take the time to review along with your updated medication list.  Annual flu vaccine Will defer PSA to at least next year Colon due in December--he will go ahead with it Discussed increasing exercise

## 2017-02-03 NOTE — Progress Notes (Signed)
Subjective:    Patient ID: Alexis Sharp, male    DOB: [birth year], 71 y.o.   MRN: [id number]  HPI Here for Medicare wellness visit and follow up of chronic health conditions Reviewed form and advanced directives Reviewed other doctors No alcohol or tobacco Tries to walk fairly regularly Vision is okay--continues regular visits for the glaucoma Hearing aides--work well No falls No depression or anhedonia Independent with instrumental ADLs No sig memory issues  Variable with the insulin Takes the insulin some days--- 12-24 units if his sugar is higher (takes in the evening) Has had trouble figuring out any pattern Careful with his eating--limiting his carbs High in AM for him is 140-150's No hypoglycemic reactions---overall, his sugars have been good  Cholesterol levels are fairly good Still prefers no statin  No heartburn for some years No swallowing problems Off meds for some time---?since stopping diet soda  Current Outpatient Prescriptions on File Prior to Visit  Medication Sig Dispense Refill  . aspirin 81 MG chewable tablet Chew 81 mg by mouth daily.      . bimatoprost (LUMIGAN) 0.01 % SOLN 1 drop at bedtime.    Marland Kitchen glipiZIDE (GLUCOTROL) 5 MG tablet Take 1 tablet (5 mg total) by mouth 2 (two) times daily before a meal. 180 tablet 3  . glucose blood (ONE TOUCH TEST STRIPS) test strip Use to check blood sugar once a day. Dx Code E11.9 100 each 4  . Insulin Glargine (LANTUS SOLOSTAR) 100 UNIT/ML Solostar Pen INJECT  24 UNITS SUBCUTANEOUSLY EVERY DAY  AT  10PM 30 mL 3  . Insulin Pen Needle (BD PEN NEEDLE NANO U/F) 32G X 4 MM MISC USE ONE TIME DAILY TO ADMINISTER INSULIN 90 each 3  . metFORMIN (GLUCOPHAGE) 500 MG tablet Take 1 tablet (500 mg total) by mouth 3 (three) times daily. 270 tablet 3  . Multiple Vitamin (MULTIVITAMIN) capsule Take 1 capsule by mouth daily.      Glory Rosebush DELICA LANCETS FINE MISC Use to check blood sugar once a day. Dx Code E11.9 100 each 3  .  timolol (TIMOPTIC) 0.5 % ophthalmic solution      No current facility-administered medications on file prior to visit.     Allergies  Allergen Reactions  . Codeine   . Pioglitazone     REACTION: achy  . Sitagliptin Phosphate     REACTION: muscle aches  . Atorvastatin Other (See Comments)    myalgia  . Crestor [Rosuvastatin]     Muscle aching    Past Medical History:  Diagnosis Date  . Diabetes mellitus   . Glaucoma   . Hyperlipidemia   . Migraines   . Reflux esophagitis     Past Surgical History:  Procedure Laterality Date  . CATARACT EXTRACTION, BILATERAL    . UMBILICAL HERNIA REPAIR  2002    Family History  Problem Relation Age of Onset  . Diabetes Mother   . Diabetes Sister   . Heart disease Brother   . Diabetes Brother   . Heart disease Brother   . Diabetes Brother   . Diabetes Brother   . Diabetes Sister   . Diabetes Sister     Social History   Social History  . Marital status: Married    Spouse name: N/A  . Number of children: 2  . Years of education: N/A   Occupational History  . semi retired Museum/gallery curator asst,  does Pension scheme manager Employed   Social History Main Topics  . Smoking  status: Former Research scientist (life sciences)  . Smokeless tobacco: Never Used  . Alcohol use No  . Drug use: No  . Sexual activity: Yes   Other Topics Concern  . Not on file   Social History Narrative   No living will   No health care POA---okay with wife making decisions. Daughter Joelene Millin would be alternate   Would accept resuscitation   Not sure about tube feeds   Review of Systems Appetite is good Weight is about the same Sleeps well Wears seat belt Teeth are fine--regular with dentist Bowels are fine---no blood Voids is okay. Stream is fine. Nocturia is only occasional No sig back or joint pain--just age related mild symptoms Does see dermatologist--- chest lesion removed and also head sprayed    Objective:   Physical Exam  Constitutional: He is oriented to  person, place, and time. He appears well-nourished. No distress.  HENT:  Mouth/Throat: Oropharynx is clear and moist. No oropharyngeal exudate.  Neck: No thyromegaly present.  Cardiovascular: Normal rate, regular rhythm, normal heart sounds and intact distal pulses.  Exam reveals no gallop.   No murmur heard. Pulmonary/Chest: Effort normal and breath sounds normal. No respiratory distress. He has no wheezes. He has no rales.  Abdominal: Soft. There is no tenderness.  Musculoskeletal: He exhibits no edema or tenderness.  Lymphadenopathy:    He has no cervical adenopathy.  Neurological: He is alert and oriented to person, place, and time.  President--- "Daisy Floro, Barack Obama, Bush" [phone] [id number] Recall 3/3    Skin: No rash noted. No erythema.  Psychiatric: He has a normal mood and affect. His behavior is normal.          Assessment & Plan:

## 2017-02-13 ENCOUNTER — Telehealth: Payer: Self-pay

## 2017-02-13 DIAGNOSIS — Z1211 Encounter for screening for malignant neoplasm of colon: Secondary | ICD-10-CM

## 2017-02-13 NOTE — Telephone Encounter (Signed)
Cathy left v/m; pt was seen 02/03/17 and pt needs referral to see Dr Vira Agar for screening colonoscopy. Cathy request cb to pt or herself;(do not see signed DPR).

## 2017-03-31 ENCOUNTER — Ambulatory Visit (INDEPENDENT_AMBULATORY_CARE_PROVIDER_SITE_OTHER): Payer: PPO

## 2017-03-31 DIAGNOSIS — Z23 Encounter for immunization: Secondary | ICD-10-CM | POA: Diagnosis not present

## 2017-04-03 DIAGNOSIS — H401131 Primary open-angle glaucoma, bilateral, mild stage: Secondary | ICD-10-CM | POA: Diagnosis not present

## 2017-04-18 DIAGNOSIS — J40 Bronchitis, not specified as acute or chronic: Secondary | ICD-10-CM | POA: Diagnosis not present

## 2017-04-18 DIAGNOSIS — R05 Cough: Secondary | ICD-10-CM | POA: Diagnosis not present

## 2017-04-24 DIAGNOSIS — H401131 Primary open-angle glaucoma, bilateral, mild stage: Secondary | ICD-10-CM | POA: Diagnosis not present

## 2017-04-28 DIAGNOSIS — H401131 Primary open-angle glaucoma, bilateral, mild stage: Secondary | ICD-10-CM | POA: Diagnosis not present

## 2017-05-05 ENCOUNTER — Encounter: Payer: Self-pay | Admitting: Internal Medicine

## 2017-05-13 ENCOUNTER — Other Ambulatory Visit: Payer: Self-pay | Admitting: Internal Medicine

## 2017-05-23 ENCOUNTER — Encounter: Payer: Self-pay | Admitting: Internal Medicine

## 2017-05-26 ENCOUNTER — Ambulatory Visit: Payer: PPO | Admitting: Internal Medicine

## 2017-05-26 DIAGNOSIS — H401113 Primary open-angle glaucoma, right eye, severe stage: Secondary | ICD-10-CM | POA: Diagnosis not present

## 2017-06-01 MED ORDER — FREESTYLE LIBRE SENSOR SYSTEM MISC: 1.0000 [IU] | Freq: Once | 0 refills | Status: AC

## 2017-06-01 MED ORDER — FREESTYLE LIBRE 14 DAY SENSOR MISC: 1.0000 [IU] | 6 refills | Status: DC

## 2017-06-08 MED ORDER — FREESTYLE LIBRE 14 DAY READER DEVI: 1.0000 [IU] | Freq: Once | 0 refills | Status: AC

## 2017-06-08 NOTE — Addendum Note (Signed)
Addended by: Pilar Grammes on: 06/08/2017 08:07 AM   Modules accepted: Orders

## 2017-07-01 DIAGNOSIS — Z1211 Encounter for screening for malignant neoplasm of colon: Secondary | ICD-10-CM | POA: Diagnosis not present

## 2017-07-28 DIAGNOSIS — Z1283 Encounter for screening for malignant neoplasm of skin: Secondary | ICD-10-CM | POA: Diagnosis not present

## 2017-07-28 DIAGNOSIS — L821 Other seborrheic keratosis: Secondary | ICD-10-CM | POA: Diagnosis not present

## 2017-07-28 DIAGNOSIS — L814 Other melanin hyperpigmentation: Secondary | ICD-10-CM | POA: Diagnosis not present

## 2017-07-28 DIAGNOSIS — L820 Inflamed seborrheic keratosis: Secondary | ICD-10-CM | POA: Diagnosis not present

## 2017-07-28 DIAGNOSIS — L578 Other skin changes due to chronic exposure to nonionizing radiation: Secondary | ICD-10-CM | POA: Diagnosis not present

## 2017-07-28 DIAGNOSIS — L570 Actinic keratosis: Secondary | ICD-10-CM | POA: Diagnosis not present

## 2017-07-28 DIAGNOSIS — L853 Xerosis cutis: Secondary | ICD-10-CM | POA: Diagnosis not present

## 2017-08-04 ENCOUNTER — Encounter: Payer: Self-pay | Admitting: Internal Medicine

## 2017-08-04 ENCOUNTER — Ambulatory Visit (INDEPENDENT_AMBULATORY_CARE_PROVIDER_SITE_OTHER): Payer: PPO | Admitting: Internal Medicine

## 2017-08-04 VITALS — BP 118/78 | HR 59 | Temp 97.9°F | Wt 219.0 lb

## 2017-08-04 DIAGNOSIS — Z794 Long term (current) use of insulin: Secondary | ICD-10-CM

## 2017-08-04 DIAGNOSIS — E119 Type 2 diabetes mellitus without complications: Secondary | ICD-10-CM

## 2017-08-04 DIAGNOSIS — E785 Hyperlipidemia, unspecified: Secondary | ICD-10-CM

## 2017-08-04 LAB — HEMOGLOBIN A1C: HEMOGLOBIN A1C: 6.8 % — AB (ref 4.6–6.5)

## 2017-08-04 NOTE — Progress Notes (Signed)
Subjective:    Patient ID: Alexis Sharp, male    DOB: 1946/03/18, 72 y.o.   MRN: [id number]  HPI Here for follow up of his diabetes  Feels he is doing fairly well Got the freestyle Orlene Plum so is monitoring his sugars well Has tended to be high between 6AM to noon (9-10)-- about 150-170--then it comes down Takes lantus at night Stopped lantus for 2 weeks--didn't have much effect   Continues to do some exercise--not overly aggressive Weight is stable  Current Outpatient Medications on File Prior to Visit  Medication Sig Dispense Refill  . aspirin 81 MG chewable tablet Chew 81 mg by mouth daily.      . bimatoprost (LUMIGAN) 0.01 % SOLN 1 drop at bedtime.    . Continuous Blood Gluc Sensor (FREESTYLE LIBRE 14 DAY SENSOR) MISC 1 Units by Does not apply route every 14 (fourteen) days. 2 each 6  . glipiZIDE (GLUCOTROL) 5 MG tablet Take 1 tablet (5 mg total) by mouth 2 (two) times daily before a meal. 180 tablet 3  . glucose blood (ONE TOUCH ULTRA TEST) test strip USE ONE STRIP TO CHECK GLUCOSE ONCE DAILY 100 each 3  . Insulin Glargine (LANTUS SOLOSTAR) 100 UNIT/ML Solostar Pen INJECT  24 UNITS SUBCUTANEOUSLY EVERY DAY  AT  10PM 30 mL 3  . Insulin Pen Needle (BD PEN NEEDLE NANO U/F) 32G X 4 MM MISC USE ONE TIME DAILY TO ADMINISTER INSULIN 90 each 3  . metFORMIN (GLUCOPHAGE) 500 MG tablet Take 1 tablet (500 mg total) by mouth 3 (three) times daily. 270 tablet 3  . Multiple Vitamin (MULTIVITAMIN) capsule Take 1 capsule by mouth daily.      Glory Rosebush DELICA LANCETS FINE MISC Use to check blood sugar once a day. Dx Code E11.9 100 each 3  . timolol (TIMOPTIC) 0.5 % ophthalmic solution      No current facility-administered medications on file prior to visit.     Allergies  Allergen Reactions  . Codeine   . Pioglitazone     REACTION: achy  . Sitagliptin Phosphate     REACTION: muscle aches  . Atorvastatin Other (See Comments)    myalgia  . Crestor [Rosuvastatin]     Muscle aching     Past Medical History:  Diagnosis Date  . Diabetes mellitus   . Glaucoma   . Hyperlipidemia   . Migraines   . Reflux esophagitis     Past Surgical History:  Procedure Laterality Date  . CATARACT EXTRACTION, BILATERAL    . UMBILICAL HERNIA REPAIR  2002    Family History  Problem Relation Age of Onset  . Diabetes Mother   . Diabetes Sister   . Heart disease Brother   . Diabetes Brother   . Heart disease Brother   . Diabetes Brother   . Diabetes Brother   . Diabetes Sister   . Diabetes Sister     Social History   Socioeconomic History  . Marital status: Married    Spouse name: Not on file  . Number of children: 2  . Years of education: Not on file  . Highest education level: Not on file  Social Needs  . Financial resource strain: Not on file  . Food insecurity - worry: Not on file  . Food insecurity - inability: Not on file  . Transportation needs - medical: Not on file  . Transportation needs - non-medical: Not on file  Occupational History  . Occupation: semi retired Museum/gallery curator asst,  does real estate appraisals    Employer: SELF EMPLOYED  Tobacco Use  . Smoking status: Former Research scientist (life sciences)  . Smokeless tobacco: Never Used  Substance and Sexual Activity  . Alcohol use: No    Alcohol/week: 0.0 oz  . Drug use: No  . Sexual activity: Yes  Other Topics Concern  . Not on file  Social History Narrative   No living will   No health care POA---okay with wife making decisions. Daughter Joelene Millin would be alternate   Would accept resuscitation   Not sure about tube feeds   Review of Systems  Sleeps okay No chest pain or SOB Does feel the need to sniff in at times---snorts in at night (discussed humidifier)     Objective:   Physical Exam  Constitutional: He appears well-developed. No distress.  Neck: No thyromegaly present.  Cardiovascular: Normal rate, regular rhythm and normal heart sounds. Exam reveals no gallop.  No murmur heard. Pulmonary/Chest: Effort  normal and breath sounds normal. No respiratory distress. He has no wheezes. He has no rales.  Lymphadenopathy:    He has no cervical adenopathy.  Skin:  No foot lesions  Psychiatric: He has a normal mood and affect. His behavior is normal.          Assessment & Plan:

## 2017-08-04 NOTE — Assessment & Plan Note (Signed)
Still seems to be doing well Discussed--no reason for fast acting insulin Will check A1c

## 2017-08-04 NOTE — Assessment & Plan Note (Signed)
Discussed new recommendations He is adamantly opposed to taking any statin

## 2017-08-10 DIAGNOSIS — H401113 Primary open-angle glaucoma, right eye, severe stage: Secondary | ICD-10-CM | POA: Diagnosis not present

## 2017-08-18 DIAGNOSIS — J0100 Acute maxillary sinusitis, unspecified: Secondary | ICD-10-CM | POA: Diagnosis not present

## 2017-08-26 ENCOUNTER — Other Ambulatory Visit: Payer: Self-pay | Admitting: Internal Medicine

## 2017-08-29 DIAGNOSIS — H6983 Other specified disorders of Eustachian tube, bilateral: Secondary | ICD-10-CM | POA: Diagnosis not present

## 2017-08-29 DIAGNOSIS — J019 Acute sinusitis, unspecified: Secondary | ICD-10-CM | POA: Diagnosis not present

## 2017-08-29 DIAGNOSIS — B9689 Other specified bacterial agents as the cause of diseases classified elsewhere: Secondary | ICD-10-CM | POA: Diagnosis not present

## 2017-09-03 ENCOUNTER — Encounter: Payer: Self-pay | Admitting: Internal Medicine

## 2017-09-10 ENCOUNTER — Encounter: Payer: Self-pay | Admitting: *Deleted

## 2017-09-11 ENCOUNTER — Ambulatory Visit: Payer: PPO | Admitting: Anesthesiology

## 2017-09-11 ENCOUNTER — Encounter
Admission: RE | Disposition: A | Discharge status: Home / Self Care | Payer: Self-pay | Source: Ambulatory Visit | Attending: Unknown Physician Specialty

## 2017-09-11 ENCOUNTER — Ambulatory Visit
Admission: RE | Admit: 2017-09-11 | Discharge: 2017-09-11 | Disposition: A | Discharge status: Home / Self Care | Payer: PPO | Source: Ambulatory Visit | Attending: Unknown Physician Specialty | Admitting: Unknown Physician Specialty

## 2017-09-11 ENCOUNTER — Encounter: Payer: Self-pay | Admitting: *Deleted

## 2017-09-11 DIAGNOSIS — Z79899 Other long term (current) drug therapy: Secondary | ICD-10-CM | POA: Insufficient documentation

## 2017-09-11 DIAGNOSIS — Z9842 Cataract extraction status, left eye: Secondary | ICD-10-CM | POA: Diagnosis not present

## 2017-09-11 DIAGNOSIS — D122 Benign neoplasm of ascending colon: Secondary | ICD-10-CM | POA: Diagnosis not present

## 2017-09-11 DIAGNOSIS — K210 Gastro-esophageal reflux disease with esophagitis: Secondary | ICD-10-CM | POA: Insufficient documentation

## 2017-09-11 DIAGNOSIS — Z87891 Personal history of nicotine dependence: Secondary | ICD-10-CM | POA: Diagnosis not present

## 2017-09-11 DIAGNOSIS — Z7984 Long term (current) use of oral hypoglycemic drugs: Secondary | ICD-10-CM | POA: Insufficient documentation

## 2017-09-11 DIAGNOSIS — Z7982 Long term (current) use of aspirin: Secondary | ICD-10-CM | POA: Diagnosis not present

## 2017-09-11 DIAGNOSIS — Z1211 Encounter for screening for malignant neoplasm of colon: Secondary | ICD-10-CM | POA: Insufficient documentation

## 2017-09-11 DIAGNOSIS — D120 Benign neoplasm of cecum: Secondary | ICD-10-CM | POA: Insufficient documentation

## 2017-09-11 DIAGNOSIS — E119 Type 2 diabetes mellitus without complications: Secondary | ICD-10-CM | POA: Diagnosis not present

## 2017-09-11 DIAGNOSIS — E785 Hyperlipidemia, unspecified: Secondary | ICD-10-CM | POA: Diagnosis not present

## 2017-09-11 DIAGNOSIS — Z9841 Cataract extraction status, right eye: Secondary | ICD-10-CM | POA: Insufficient documentation

## 2017-09-11 DIAGNOSIS — H409 Unspecified glaucoma: Secondary | ICD-10-CM | POA: Diagnosis not present

## 2017-09-11 HISTORY — PX: COLONOSCOPY WITH PROPOFOL: SHX5780

## 2017-09-11 LAB — GLUCOSE, CAPILLARY: Glucose-Capillary: 142 mg/dL — ABNORMAL HIGH (ref 65–99)

## 2017-09-11 SURGERY — COLONOSCOPY WITH PROPOFOL
Anesthesia: General

## 2017-09-11 MED ORDER — SODIUM CHLORIDE 0.9 % IV SOLN: INTRAVENOUS | Status: DC

## 2017-09-11 MED ORDER — LIDOCAINE HCL (CARDIAC) 20 MG/ML IV SOLN
INTRAVENOUS | Status: DC | PRN
  Administered 2017-09-11: 50 mg via INTRAVENOUS

## 2017-09-11 MED ORDER — PROPOFOL 10 MG/ML IV BOLUS
INTRAVENOUS | Status: DC | PRN
  Administered 2017-09-11: 30 mg via INTRAVENOUS
  Administered 2017-09-11: 20 mg via INTRAVENOUS
  Administered 2017-09-11: 50 mg via INTRAVENOUS
  Administered 2017-09-11: 30 mg via INTRAVENOUS
  Administered 2017-09-11: 100 mg via INTRAVENOUS

## 2017-09-11 MED ORDER — PROPOFOL 10 MG/ML IV BOLUS
INTRAVENOUS | Status: AC
  Filled 2017-09-11: qty 20

## 2017-09-11 MED ORDER — PHENYLEPHRINE HCL 10 MG/ML IJ SOLN
INTRAMUSCULAR | Status: AC
  Filled 2017-09-11: qty 1

## 2017-09-11 MED ORDER — PHENYLEPHRINE HCL 10 MG/ML IJ SOLN
INTRAMUSCULAR | Status: DC | PRN
  Administered 2017-09-11 (×3): 100 ug via INTRAVENOUS

## 2017-09-11 MED ORDER — SODIUM CHLORIDE 0.9 % IV SOLN
INTRAVENOUS | Status: DC
  Administered 2017-09-11: 08:00:00 via INTRAVENOUS

## 2017-09-11 MED ORDER — LIDOCAINE HCL (PF) 2 % IJ SOLN
INTRAMUSCULAR | Status: AC
  Filled 2017-09-11: qty 10

## 2017-09-11 NOTE — Anesthesia Preprocedure Evaluation (Signed)
Anesthesia Evaluation  Patient identified by MRN, date of birth, ID band Patient awake    Reviewed: Allergy & Precautions, H&P , NPO status , Patient's Chart, lab work & pertinent test results, reviewed documented beta blocker date and time   Airway Mallampati: II   Neck ROM: full    Dental  (+) Poor Dentition   Pulmonary neg pulmonary ROS, former smoker,    Pulmonary exam normal        Cardiovascular Exercise Tolerance: Good negative cardio ROS Normal cardiovascular exam Rhythm:regular Rate:Normal     Neuro/Psych  Headaches, negative neurological ROS  negative psych ROS   GI/Hepatic negative GI ROS, Neg liver ROS,   Endo/Other  negative endocrine ROSdiabetes  Renal/GU negative Renal ROS  negative genitourinary   Musculoskeletal   Abdominal   Peds  Hematology negative hematology ROS (+)   Anesthesia Other Findings Past Medical History: No date: Diabetes mellitus No date: Glaucoma No date: Hyperlipidemia No date: Migraines No date: Reflux esophagitis Past Surgical History: No date: CATARACT EXTRACTION, BILATERAL No date: EYE SURGERY No date: HERNIA REPAIR 1308: UMBILICAL HERNIA REPAIR BMI    Body Mass Index:  30.54 kg/m     Reproductive/Obstetrics negative OB ROS                             Anesthesia Physical Anesthesia Plan  ASA: III  Anesthesia Plan: General   Post-op Pain Management:    Induction:   PONV Risk Score and Plan:   Airway Management Planned:   Additional Equipment:   Intra-op Plan:   Post-operative Plan:   Informed Consent: I have reviewed the patients History and Physical, chart, labs and discussed the procedure including the risks, benefits and alternatives for the proposed anesthesia with the patient or authorized representative who has indicated his/her understanding and acceptance.   Dental Advisory Given  Plan Discussed with:  CRNA  Anesthesia Plan Comments:         Anesthesia Quick Evaluation

## 2017-09-11 NOTE — H&P (Signed)
Primary Care Physician:  Venia Carbon, MD Primary Gastroenterologist:  Dr. Vira Agar  Pre-Procedure History & Physical: HPI:  Alexis Sharp is a 72 y.o. male is here for an colonoscopy.  This for colon cancer screening.  Past Medical History:  Diagnosis Date  . Diabetes mellitus   . Glaucoma   . Hyperlipidemia   . Migraines   . Reflux esophagitis     Past Surgical History:  Procedure Laterality Date  . CATARACT EXTRACTION, BILATERAL    . EYE SURGERY    . HERNIA REPAIR    . UMBILICAL HERNIA REPAIR  2002    Prior to Admission medications   Medication Sig Start Date End Date Taking? Authorizing Provider  aspirin 81 MG chewable tablet Chew 81 mg by mouth daily.     Yes [provider]  bimatoprost (LUMIGAN) 0.01 % SOLN 1 drop at bedtime.   Yes [provider]  glipiZIDE (GLUCOTROL) 5 MG tablet TAKE 1 TABLET BY MOUTH TWICE DAILY BEFORE A MEAL 08/26/17  Yes Venia Carbon, MD  metFORMIN (GLUCOPHAGE) 500 MG tablet Take 1 tablet (500 mg total) by mouth 3 (three) times daily. 06/25/16  Yes Venia Carbon, MD  Multiple Vitamin (MULTIVITAMIN) capsule Take 1 capsule by mouth daily.     Yes [provider]  timolol (TIMOPTIC) 0.5 % ophthalmic solution  05/01/15  Yes [provider]  Continuous Blood Gluc Sensor (FREESTYLE LIBRE 14 DAY SENSOR) MISC 1 Units by Does not apply route every 14 (fourteen) days. 06/01/17   Viviana Simpler I, MD  glucose blood (ONE TOUCH ULTRA TEST) test strip USE ONE STRIP TO CHECK GLUCOSE ONCE DAILY 05/13/17   Viviana Simpler I, MD  Insulin Glargine (LANTUS SOLOSTAR) 100 UNIT/ML Solostar Pen INJECT  24 UNITS SUBCUTANEOUSLY EVERY DAY  AT  10PM 06/25/16   Viviana Simpler I, MD  Insulin Pen Needle (BD PEN NEEDLE NANO U/F) 32G X 4 MM MISC USE ONE TIME DAILY TO ADMINISTER INSULIN 06/25/16   Venia Carbon, MD  [facility] DELICA LANCETS FINE MISC Use to check blood sugar once a day. Dx Code E11.9 05/02/16   Venia Carbon, MD     Allergies as of 07/03/2017 - Review Complete 02/03/2017  Allergen Reaction Noted  . Codeine  04/19/2007  . Pioglitazone  04/19/2007  . Sitagliptin phosphate  04/19/2007  . Atorvastatin Other (See Comments) 11/14/2015  . Crestor [rosuvastatin]  02/24/2012    Family History  Problem Relation Age of Onset  . Diabetes Mother   . Diabetes Sister   . Heart disease Brother   . Diabetes Brother   . Heart disease Brother   . Diabetes Brother   . Diabetes Brother   . Diabetes Sister   . Diabetes Sister     Social History   Socioeconomic History  . Marital status: Married    Spouse name: Not on file  . Number of children: 2  . Years of education: Not on file  . Highest education level: Not on file  Occupational History  . Occupation: semi retired Museum/gallery curator asst,  does Engineer, manufacturing: SELF EMPLOYED  Social Needs  . Financial resource strain: Not on file  . Food insecurity:    Worry: Not on file    Inability: Not on file  . Transportation needs:    Medical: Not on file    Non-medical: Not on file  Tobacco Use  . Smoking status: Former Research scientist (life sciences)  . Smokeless tobacco: Never  Used  Substance and Sexual Activity  . Alcohol use: No    Alcohol/week: 0.0 oz  . Drug use: No  . Sexual activity: Yes  Lifestyle  . Physical activity:    Days per week: Not on file    Minutes per session: Not on file  . Stress: Not on file  Relationships  . Social connections:    Talks on phone: Not on file    Gets together: Not on file    Attends religious service: Not on file    Active member of club or organization: Not on file    Attends meetings of clubs or organizations: Not on file    Relationship status: Not on file  . Intimate partner violence:    Fear of current or ex partner: Not on file    Emotionally abused: Not on file    Physically abused: Not on file    Forced sexual activity: Not on file  Other Topics Concern  . Not on file  Social History Narrative    No living will   No health care POA---okay with wife making decisions. Daughter Joelene Millin would be alternate   Would accept resuscitation   Not sure about tube feeds    Review of Systems: See HPI, otherwise negative ROS  Physical Exam: BP 137/78   Pulse 77   Temp (!) 96.8 F (36 C) (Tympanic)   Resp 16   Ht 5\' 11"  (1.803 m)   Wt 99.3 kg (219 lb)   SpO2 98%   BMI 30.54 kg/m  General:   Alert,  pleasant and cooperative in NAD Head:  Normocephalic and atraumatic. Neck:  Supple; no masses or thyromegaly. Lungs:  Clear throughout to auscultation.    Heart:  Regular rate and rhythm. Abdomen:  Soft, nontender and nondistended. Normal bowel sounds, without guarding, and without rebound.   Neurologic:  Alert and  oriented x4;  grossly normal neurologically.  Impression/Plan: Alexis Sharp is here for an colonoscopy to be performed for colon cancer screening.  Risks, benefits, limitations, and alternatives regarding  colonoscopy have been reviewed with the patient.  Questions have been answered.  All parties agreeable.   Gaylyn Cheers, MD  09/11/2017, 8:33 AM

## 2017-09-11 NOTE — Transfer of Care (Signed)
Immediate Anesthesia Transfer of Care Note  Patient: Alexis Sharp  Procedure(s) Performed: COLONOSCOPY WITH PROPOFOL (N/A )  Patient Location: PACU and Endoscopy Unit  Anesthesia Type:General  Level of Consciousness: awake  Airway & Oxygen Therapy: Patient Spontanous Breathing  Post-op Assessment: Report given to RN  Post vital signs: stable  Last Vitals:  Vitals Value Taken Time  BP 94/52 09/11/2017  9:17 AM  Temp 36.1 C 09/11/2017  9:17 AM  Pulse 75 09/11/2017  9:20 AM  Resp 13 09/11/2017  9:20 AM  SpO2 98 % 09/11/2017  9:20 AM  Vitals shown include unvalidated device data.  Last Pain:  Vitals:   09/11/17 0917  TempSrc:   PainSc: 0-No pain         Complications: No apparent anesthesia complications

## 2017-09-11 NOTE — Op Note (Signed)
[facility] Gastroenterology Patient Name: Alexis Sharp Procedure Date: 09/11/2017 8:35 AM MRN: [id number] Account #: [id number] Date of Birth: [birth year] Admit Type: Outpatient Age: 72 Room: [facility] ENDO ROOM 1 Gender: Male Note Status: Finalized Procedure:            Colonoscopy Indications:          Screening for colorectal malignant neoplasm Providers:            Manya Silvas, MD Medicines:            Propofol per Anesthesia Complications:        No immediate complications. Procedure:            Pre-Anesthesia Assessment:                       - After reviewing the risks and benefits, the patient                        was deemed in satisfactory condition to undergo the                        procedure.                       After obtaining informed consent, the colonoscope was                        passed under direct vision. Throughout the procedure,                        the patient's blood pressure, pulse, and oxygen                        saturations were monitored continuously. The                        Colonoscope was introduced through the anus and                        advanced to the the cecum, identified by appendiceal                        orifice and ileocecal valve. The colonoscopy was                        performed without difficulty. The patient tolerated the                        procedure well. The quality of the bowel preparation                        was good. Findings:      Two sessile polyps were found in the cecum. The polyps were diminutive       in size. These polyps were removed with a hot snare. Resection and       retrieval were complete.      A diminutive polyp was found in the ascending colon. The polyp was       sessile. The polyp was removed with a jumbo cold forceps. Resection and       retrieval were complete.      A small polyp was  found in the ascending colon. The polyp was sessile.       The polyp was removed  with a hot snare. Resection and retrieval were       complete.      A diminutive polyp was found in the ascending colon. The polyp was       sessile. The polyp was removed with a jumbo cold forceps. Resection and       retrieval were complete.      A small polyp was found in the ascending colon. The polyp was sessile.       The polyp was removed with a hot snare. Resection and retrieval were       complete.      The exam was otherwise without abnormality. Impression:           - Two diminutive polyps in the cecum, removed with a                        hot snare. Resected and retrieved.                       - One diminutive polyp in the ascending colon, removed                        with a jumbo cold forceps. Resected and retrieved.                       - One small polyp in the ascending colon, removed with                        a hot snare. Resected and retrieved.                       - One diminutive polyp in the ascending colon, removed                        with a jumbo cold forceps. Resected and retrieved.                       - One small polyp in the ascending colon, removed with                        a hot snare. Resected and retrieved.                       - The examination was otherwise normal. Recommendation:       - Await pathology results. Manya Silvas, MD 09/11/2017 9:14:54 AM This report has been signed electronically. Number of Addenda: 0 Note Initiated On: 09/11/2017 8:35 AM Scope Withdrawal Time: 0 hours 23 minutes 20 seconds  Total Procedure Duration: 0 hours 28 minutes 16 seconds       [facility]

## 2017-09-11 NOTE — Anesthesia Post-op Follow-up Note (Signed)
Anesthesia QCDR form completed.        

## 2017-09-11 NOTE — Anesthesia Postprocedure Evaluation (Signed)
Anesthesia Post Note  Patient: Amado Nash  Procedure(s) Performed: COLONOSCOPY WITH PROPOFOL (N/A )  Patient location during evaluation: PACU Anesthesia Type: General Level of consciousness: awake and alert Pain management: pain level controlled Vital Signs Assessment: post-procedure vital signs reviewed and stable Respiratory status: spontaneous breathing, nonlabored ventilation, respiratory function stable and patient connected to nasal cannula oxygen Cardiovascular status: blood pressure returned to baseline and stable Postop Assessment: no apparent nausea or vomiting Anesthetic complications: no     Last Vitals:  Vitals:   09/11/17 0937 09/11/17 0947  BP: 122/70 123/72  Pulse: (!) 55 (!) 56  Resp: 16 18  Temp:    SpO2: 100% 100%    Last Pain:  Vitals:   09/11/17 0947  TempSrc:   PainSc: 0-No pain                 Molli Barrows

## 2017-09-14 ENCOUNTER — Encounter: Payer: Self-pay | Admitting: Unknown Physician Specialty

## 2017-09-14 LAB — SURGICAL PATHOLOGY

## 2017-09-22 ENCOUNTER — Other Ambulatory Visit: Payer: Self-pay | Admitting: Internal Medicine

## 2017-10-13 ENCOUNTER — Encounter: Payer: Self-pay | Admitting: Family Medicine

## 2017-10-13 ENCOUNTER — Ambulatory Visit (INDEPENDENT_AMBULATORY_CARE_PROVIDER_SITE_OTHER): Payer: PPO | Admitting: Family Medicine

## 2017-10-13 VITALS — BP 138/88 | HR 76 | Temp 98.2°F | Resp 16 | Wt 220.0 lb

## 2017-10-13 DIAGNOSIS — J0140 Acute pansinusitis, unspecified: Secondary | ICD-10-CM | POA: Diagnosis not present

## 2017-10-13 MED ORDER — PREDNISONE 10 MG PO TABS: ORAL_TABLET | ORAL | 0 refills | Status: DC

## 2017-10-13 MED ORDER — AMOXICILLIN-POT CLAVULANATE 875-125 MG PO TABS: 1.0000 | ORAL_TABLET | Freq: Two times a day (BID) | ORAL | 0 refills | Status: DC

## 2017-10-13 NOTE — Patient Instructions (Signed)
Please take medication as directed with food.  If symptoms do not improve with treatment, worsen, or new symptoms develop such as fever >101, please follow up with your provider for further evaluation and treatment.  Please drink plenty of water so that your urine is pale yellow or clear. Also, get plenty of rest, use tylenol as needed for discomfort.   Sinusitis, Adult Sinusitis is soreness and inflammation of your sinuses. Sinuses are hollow spaces in the bones around your face. They are located:  Around your eyes.  In the middle of your forehead.  Behind your nose.  In your cheekbones.  Your sinuses and nasal passages are lined with a stringy fluid (mucus). Mucus normally drains out of your sinuses. When your nasal tissues get inflamed or swollen, the mucus can get trapped or blocked so air cannot flow through your sinuses. This lets bacteria, viruses, and funguses grow, and that leads to infection. Follow these instructions at home: Medicines  Take, use, or apply over-the-counter and prescription medicines only as told by your doctor. These may include nasal sprays.  If you were prescribed an antibiotic medicine, take it as told by your doctor. Do not stop taking the antibiotic even if you start to feel better. Hydrate and Humidify  Drink enough water to keep your pee (urine) clear or pale yellow.  Use a cool mist humidifier to keep the humidity level in your home above 50%.  Breathe in steam for 10-15 minutes, 3-4 times a day or as told by your doctor. You can do this in the bathroom while a hot shower is running.  Try not to spend time in cool or dry air. Rest  Rest as much as possible.  Sleep with your head raised (elevated).  Make sure to get enough sleep each night. General instructions  Put a warm, moist washcloth on your face 3-4 times a day or as told by your doctor. This will help with discomfort.  Wash your hands often with soap and water. If there is no  soap and water, use hand sanitizer.  Do not smoke. Avoid being around people who are smoking (secondhand smoke).  Keep all follow-up visits as told by your doctor. This is important. Contact a doctor if:  You have a fever.  Your symptoms get worse.  Your symptoms do not get better within 10 days. Get help right away if:  You have a very bad headache.  You cannot stop throwing up (vomiting).  You have pain or swelling around your face or eyes.  You have trouble seeing.  You feel confused.  Your neck is stiff.  You have trouble breathing. This information is not intended to replace advice given to you by your health care provider. Make sure you discuss any questions you have with your health care provider. Document Released: 11/19/2007 Document Revised: 01/27/2016 Document Reviewed: 03/28/2015 Elsevier Interactive Patient Education  [employer].

## 2017-10-13 NOTE — Progress Notes (Signed)
Patient ID: Alexis Sharp, male   DOB: 1945/09/05, 72 y.o.   MRN: [id number] PCP: Venia Carbon, MD  Subjective:  Alexis Sharp is a 72 y.o. year old very pleasant male patient who presents with symptoms including nasal congestion, nasal purulence, sinus pressure, non productive cough, and post nasal drip -started: 2 to 3 weeks ago, symptoms are not improving -previous treatments: loratadine and flonase has provided limited benefit.  -sick contacts/travel/risks: denies flu exposure. He denies recent sick contact exposure or flu exposure -Hx of: seasonal allergies. Takes loratadine daily He was treated on 08/18/17 for sinusitis with Augmentin and Prednisone that provided excellent benefit.   He is seeking care today because he is flying tomorrow to his brother's funeral and wanted to address symptoms.    ROS-denies fever, SOB, NVD, tooth pain  Pertinent Past Medical History- T2DM  Medications- reviewed  Current Outpatient Medications  Medication Sig Dispense Refill  . aspirin 81 MG chewable tablet Chew 81 mg by mouth daily.      . bimatoprost (LUMIGAN) 0.01 % SOLN 1 drop at bedtime.    . Continuous Blood Gluc Sensor (FREESTYLE LIBRE 14 DAY SENSOR) MISC 1 Units by Does not apply route every 14 (fourteen) days. 2 each 6  . glipiZIDE (GLUCOTROL) 5 MG tablet TAKE 1 TABLET BY MOUTH TWICE DAILY BEFORE A MEAL 180 tablet 3  . glucose blood (ONE TOUCH ULTRA TEST) test strip USE ONE STRIP TO CHECK GLUCOSE ONCE DAILY 100 each 3  . Insulin Glargine (LANTUS SOLOSTAR) 100 UNIT/ML Solostar Pen INJECT  24 UNITS SUBCUTANEOUSLY EVERY DAY  AT  10PM 30 mL 3  . Insulin Pen Needle (BD PEN NEEDLE NANO U/F) 32G X 4 MM MISC USE WITH INSULIN ONCE DAILY 100 each 3  . metFORMIN (GLUCOPHAGE) 500 MG tablet Take 1 tablet (500 mg total) by mouth 3 (three) times daily. 270 tablet 3  . Multiple Vitamin (MULTIVITAMIN) capsule Take 1 capsule by mouth daily.      Glory Rosebush DELICA LANCETS FINE MISC Use to check blood sugar  once a day. Dx Code E11.9 100 each 3  . timolol (TIMOPTIC) 0.5 % ophthalmic solution      No current facility-administered medications for this visit.     Objective: BP (!) 158/98 (BP Location: Left Arm, Patient Position: Sitting, Cuff Size: Normal)   Pulse 76   Temp 98.2 F (36.8 C) (Oral)   Resp 16   Wt 220 lb (99.8 kg)   SpO2 96%   BMI 30.68 kg/m   Retake of BP 138/88 Gen: NAD, resting comfortably HEENT: Turbinates erythematous, TMs normal, pharynx mildly erythematous with no tonsilar exudate or edema, + maxillary and frontal sinus tenderness CV: RRR no murmurs rubs or gallops Lungs: CTAB no crackles, wheeze, rhonchi Abdomen: soft/nontender/nondistended/normal bowel sounds. No rebound or guarding.  Ext: no edema Skin: warm, dry, no rash Neuro: grossly normal, moves all extremities  Assessment/Plan: 1. Acute pansinusitis, recurrence not specified Exam and history of consistent with sinusitis and support empiric treatment with Augmentin today. With prior episode of sinusitis that resolved with Augmentin, will provide this today however we discussed that if symptoms do not fully resolve or this occurs again, can consider further evaluation of sinuses. Advised patient on supportive measures:  Get rest, drink plenty of fluids, and use tylenol  as needed for pain. Continue aggressive treatment of seasonal allergies with loratadine and flonase as directed. Follow up if fever >101, if symptoms worsen or if symptoms are not improved in 3-4  days. With significant sinus pressure present, opted to treat with short prednisone taper as he will be traveling out of town. History of T2DM that is controlled.  Lab Results  Component Value Date   HGBA1C 6.8 (H) 08/04/2017    - amoxicillin-clavulanate (AUGMENTIN) 875-125 MG tablet; Take 1 tablet by mouth 2 (two) times daily.  Dispense: 20 tablet; Refill: 0 - predniSONE (DELTASONE) 10 MG tablet; Take 4 tablets once daily for 2 days, 3 tabs daily for  2 days, 2 tabs daily for 2 days, 1 tab daily for 2 days.  Dispense: 20 tablet; Refill: 0   Finally, we reviewed reasons to return to care including if symptoms worsen or persist or new concerns arise- once again particularly shortness of breath or fever.   Laurita Quint, FNP

## 2017-10-26 ENCOUNTER — Encounter: Payer: Self-pay | Admitting: Internal Medicine

## 2017-10-27 MED ORDER — INSULIN GLARGINE 100 UNIT/ML SOLOSTAR PEN: PEN_INJECTOR | SUBCUTANEOUS | 3 refills | Status: DC

## 2017-11-10 ENCOUNTER — Encounter: Payer: Self-pay | Admitting: Internal Medicine

## 2017-11-10 DIAGNOSIS — J301 Allergic rhinitis due to pollen: Secondary | ICD-10-CM | POA: Diagnosis not present

## 2017-11-10 DIAGNOSIS — J019 Acute sinusitis, unspecified: Secondary | ICD-10-CM | POA: Diagnosis not present

## 2017-11-10 MED ORDER — INSULIN PEN NEEDLE 32G X 4 MM MISC: 3 refills | Status: DC

## 2017-11-19 ENCOUNTER — Encounter: Payer: Self-pay | Admitting: Internal Medicine

## 2017-11-19 MED ORDER — PEN NEEDLES 32G X 4 MM MISC: 1.0000 [IU] | Freq: Every day | 3 refills | Status: DC

## 2017-12-14 ENCOUNTER — Telehealth: Payer: Self-pay

## 2017-12-14 NOTE — Telephone Encounter (Signed)
That is okay with me 

## 2017-12-14 NOTE — Telephone Encounter (Signed)
Will forward to Dr. Silvio Pate regarding patient's request.

## 2017-12-14 NOTE — Telephone Encounter (Signed)
Copied from [place] [phone]. Topic: Appointment Scheduling - Scheduling Inquiry for Clinic >> Dec 14, 2017  6:47 AM Lennox Solders wrote: Reason for CRM:pt wife is calling and her husband would like to switch to dr mclean-scocuzza >> Dec 14, 2017  6:53 AM Lennox Solders wrote: Pt wife sees dr Olivia Mackie mclean-scocuzza

## 2017-12-15 NOTE — Telephone Encounter (Signed)
Ok for switch 

## 2017-12-15 NOTE — Telephone Encounter (Signed)
Ok to switch   [employer]

## 2017-12-15 NOTE — Telephone Encounter (Signed)
Noted, I will forward this to Elpidio Galea, [place] for Dr. Terese Door to coordinate NP appointment.    Thanks.

## 2017-12-16 NOTE — Telephone Encounter (Signed)
Alexis Sharp will call the patient to schedule.

## 2017-12-23 ENCOUNTER — Ambulatory Visit (INDEPENDENT_AMBULATORY_CARE_PROVIDER_SITE_OTHER): Payer: PPO | Admitting: Internal Medicine

## 2017-12-23 ENCOUNTER — Encounter: Payer: Self-pay | Admitting: Internal Medicine

## 2017-12-23 VITALS — BP 130/70 | HR 56 | Temp 98.2°F | Ht 71.0 in | Wt 223.6 lb

## 2017-12-23 DIAGNOSIS — H409 Unspecified glaucoma: Secondary | ICD-10-CM

## 2017-12-23 DIAGNOSIS — J329 Chronic sinusitis, unspecified: Secondary | ICD-10-CM

## 2017-12-23 DIAGNOSIS — R0982 Postnasal drip: Secondary | ICD-10-CM | POA: Diagnosis not present

## 2017-12-23 DIAGNOSIS — J309 Allergic rhinitis, unspecified: Secondary | ICD-10-CM | POA: Diagnosis not present

## 2017-12-23 DIAGNOSIS — Z0000 Encounter for general adult medical examination without abnormal findings: Secondary | ICD-10-CM

## 2017-12-23 DIAGNOSIS — E119 Type 2 diabetes mellitus without complications: Secondary | ICD-10-CM

## 2017-12-23 DIAGNOSIS — E559 Vitamin D deficiency, unspecified: Secondary | ICD-10-CM | POA: Diagnosis not present

## 2017-12-23 DIAGNOSIS — Z125 Encounter for screening for malignant neoplasm of prostate: Secondary | ICD-10-CM

## 2017-12-23 DIAGNOSIS — Z794 Long term (current) use of insulin: Secondary | ICD-10-CM | POA: Diagnosis not present

## 2017-12-23 DIAGNOSIS — Z1329 Encounter for screening for other suspected endocrine disorder: Secondary | ICD-10-CM | POA: Diagnosis not present

## 2017-12-23 DIAGNOSIS — Z1159 Encounter for screening for other viral diseases: Secondary | ICD-10-CM | POA: Diagnosis not present

## 2017-12-23 DIAGNOSIS — Z0184 Encounter for antibody response examination: Secondary | ICD-10-CM | POA: Diagnosis not present

## 2017-12-23 MED ORDER — INSULIN ASPART 100 UNIT/ML FLEXPEN: PEN_INJECTOR | SUBCUTANEOUS | 11 refills | Status: DC

## 2017-12-23 MED ORDER — IPRATROPIUM BROMIDE 0.06 % NA SOLN: 2.0000 | Freq: Four times a day (QID) | NASAL | 12 refills | Status: DC

## 2017-12-23 MED ORDER — PEN NEEDLES 31G X 6 MM MISC: 1.0000 | Freq: Three times a day (TID) | 3 refills | Status: DC | PRN

## 2017-12-23 NOTE — Progress Notes (Signed)
Chief Complaint  Patient presents with  . Establish Care  . Cough  . Sinusitis   Transfer of care from Dr. Silvio Pate with wife today  1. C/o post nasal drainage causing cough and chronic sinus issues I.e pressure since 08/2017. He was on levaquin 500 mg x 5 days and prednisone 08/29/17 and Asetaline nasal and then Augmentin and prednisone 10/13/17 and still not better. C/o congestion, postnasal drip. He states taking an oral antihistamine makes drainage worse.  He has not lost taste or sense of smell  He has seen [facility] Dr. Richardson Landry he is allergic to dust grass  2. DM 2 A1C 6.8 07/2017 on glipizide 5 mg bid, metformin 500 mg bid and lantus 10-24 units he has freestyle libre and has had low blood sugars. He sees [facility] q 6 months for glaucoma Dr. Edison Pace and due to f/u 12/2017  He is not on statin or ARB/ACEI had Rx with lipitor and crestor in the past   Review of Systems  Constitutional: Negative for weight loss.  HENT: Positive for sinus pain.        +pnd  Eyes: Negative for blurred vision.  Respiratory: Negative for shortness of breath.   Cardiovascular: Negative for chest pain.  Gastrointestinal: Negative for abdominal pain.  Musculoskeletal: Negative for falls.  Skin: Negative for rash.  Neurological: Negative for headaches.  Endo/Heme/Allergies: Positive for environmental allergies.  Psychiatric/Behavioral: Negative for memory loss.   Past Medical History:  Diagnosis Date  . Diabetes mellitus   . Glaucoma   . Hyperlipidemia   . Migraines   . Reflux esophagitis    Past Surgical History:  Procedure Laterality Date  . CATARACT EXTRACTION, BILATERAL    . COLONOSCOPY WITH PROPOFOL N/A 09/11/2017   Procedure: COLONOSCOPY WITH PROPOFOL;  Surgeon: Manya Silvas, MD;  Location[facility] END[facility]COPY;  Service: Endoscopy;  Laterality: N/A;  . EYE SURGERY    . HERNIA REPAIR    . UMBILICAL HERNIA REPAIR  2002   Family History  Problem Relation Age of Onset  . Diabetes Mother   .  Diabetes Sister   . Heart disease Brother   . Diabetes Brother   . Heart disease Brother   . Diabetes Brother   . Diabetes Brother   . Diabetes Sister   . Diabetes Sister    Social History   Socioeconomic History  . Marital status: Married    Spouse name: Not on file  . Number of children: 2  . Years of education: Not on file  . Highest education level: Not on file  Occupational History  . Occupation: semi retired Museum/gallery curator asst,  does Engineer, manufacturing: SELF EMPLOYED  Social Needs  . Financial resource strain: Not on file  . Food insecurity:    Worry: Not on file    Inability: Not on file  . Transportation needs:    Medical: Not on file    Non-medical: Not on file  Tobacco Use  . Smoking status: Former Research scientist (life sciences)  . Smokeless tobacco: Never Used  Substance and Sexual Activity  . Alcohol use: No    Alcohol/week: 0.0 oz  . Drug use: No  . Sexual activity: Yes  Lifestyle  . Physical activity:    Days per week: Not on file    Minutes per session: Not on file  . Stress: Not on file  Relationships  . Social connections:    Talks on phone: Not on file    Gets together:  Not on file    Attends religious service: Not on file    Active member of club or organization: Not on file    Attends meetings of clubs or organizations: Not on file    Relationship status: Not on file  . Intimate partner violence:    Fear of current or ex partner: Not on file    Emotionally abused: Not on file    Physically abused: Not on file    Forced sexual activity: Not on file  Other Topics Concern  . Not on file  Social History Narrative   No living will   No health care POA---okay with wife making decisions. Daughter Joelene Millin would be alternate   Would accept resuscitation   Not sure about tube feeds   Current Meds  Medication Sig  . aspirin 81 MG chewable tablet Chew 81 mg by mouth daily.    . bimatoprost (LUMIGAN) 0.01 % SOLN 1 drop at bedtime.  . Continuous Blood Gluc  Sensor (FREESTYLE LIBRE 14 DAY SENSOR) MISC 1 Units by Does not apply route every 14 (fourteen) days.  Marland Kitchen glipiZIDE (GLUCOTROL) 5 MG tablet TAKE 1 TABLET BY MOUTH TWICE DAILY BEFORE A MEAL  . glucose blood (ONE TOUCH ULTRA TEST) test strip USE ONE STRIP TO CHECK GLUCOSE ONCE DAILY  . Insulin Glargine (LANTUS SOLOSTAR) 100 UNIT/ML Solostar Pen INJECT  24 UNITS SUBCUTANEOUSLY EVERY DAY  AT  10PM  . Insulin Pen Needle (PEN NEEDLES) 32G X 4 MM MISC 1 Units by Does not apply route daily. Use 1 pen needle daily with Lantus  . metFORMIN (GLUCOPHAGE) 500 MG tablet Take 1 tablet (500 mg total) by mouth 3 (three) times daily.  . Multiple Vitamin (MULTIVITAMIN) capsule Take 1 capsule by mouth daily.    Glory Rosebush DELICA LANCETS FINE MISC Use to check blood sugar once a day. Dx Code E11.9  . timolol (TIMOPTIC) 0.5 % ophthalmic solution    Allergies  Allergen Reactions  . Codeine   . Pioglitazone     REACTION: achy  . Sitagliptin Phosphate     REACTION: muscle aches  . Atorvastatin Other (See Comments)    myalgia  . Crestor [Rosuvastatin]     Muscle aching   No results found for this or any previous visit (from the past 2160 hour(s)). Objective  Body mass index is 31.19 kg/m. Wt Readings from Last 3 Encounters:  12/23/17 223 lb 9.6 oz (101.4 kg)  10/13/17 220 lb (99.8 kg)  09/11/17 219 lb (99.3 kg)   Temp Readings from Last 3 Encounters:  12/23/17 98.2 F (36.8 C) (Oral)  10/13/17 98.2 F (36.8 C) (Oral)  09/11/17 (!) 97 F (36.1 C)   BP Readings from Last 3 Encounters:  12/23/17 130/70  10/13/17 138/88  09/11/17 123/72   Pulse Readings from Last 3 Encounters:  12/23/17 (!) 56  10/13/17 76  09/11/17 (!) 56    Physical Exam  Constitutional: He is oriented to person, place, and time. Vital signs are normal. He appears well-developed and well-nourished. He is cooperative.  HENT:  Head: Normocephalic and atraumatic.  Nose: Right sinus exhibits maxillary sinus tenderness and  frontal sinus tenderness. Left sinus exhibits maxillary sinus tenderness and frontal sinus tenderness.  Mouth/Throat: Oropharynx is clear and moist and mucous membranes are normal.  Eyes: Pupils are equal, round, and reactive to light. Conjunctivae are normal.  Cardiovascular: Normal rate, regular rhythm and normal heart sounds.  Pulmonary/Chest: Effort normal and breath sounds normal.  Neurological: He is  alert and oriented to person, place, and time. Gait normal.  Skin: Skin is warm, dry and intact.  Psychiatric: He has a normal mood and affect. His speech is normal and behavior is normal. Judgment and thought content normal. Cognition and memory are normal.  Nursing note and vitals reviewed.   Assessment   1. Sinusitis and postnasal drainage with allergic rhinitis  2. DM 2 controlled w/o complications O6Z 6.8  3. HM Plan   1.  Will order CT sinus  Get records Dr. Richardson Landry [place] [facility] Unable to tolerate oral antihistamine consider singulair in future  Trial of atrovent  2.  D/c lantus  trial of SSI-S with novolog  F/u [facility] due 12/2017 and f/u q6 months Dr. Edison Pace h/o glaucoma Cont glipizide 5 mg bid and metformin 500 mg bid  Do foot exam in future  Check labs upcoming  Unable to tolerate statin, not on ACEI/ARB 3.  utd flu, prevnar, pna 23 had 10/24/15 due in 5 years, Tdap utd, shingrix per pt had 1/2 at [place] labs upcoming fasting including PSA, MMR status  HCV neg 10/24/15  Colonoscopy had 09/11/17 for multiple polyps [facility] tubular f/u in 3 years  Dermatology yearly Dr. Nicole Kindred h/o Aks due to see w/in 6 months    Provider: Dr. Olivia Mackie [facility]

## 2017-12-23 NOTE — Patient Instructions (Addendum)
sch fasting labs 1-2 weeks no food only water and medications x 12 hours  F/u in 4 weeks  Stop Lantus   Sliding scale insulin  <70 0 units, 70-130 0 units,  131-180 2 units, 181-240 4 units, 241-300 6 units, 301-350 8 units, 351-400 10 units, >400 12 units and call doctor   Sinusitis, Adult Sinusitis is soreness and inflammation of your sinuses. Sinuses are hollow spaces in the bones around your face. Your sinuses are located:  Around your eyes.  In the middle of your forehead.  Behind your nose.  In your cheekbones.  Your sinuses and nasal passages are lined with a stringy fluid (mucus). Mucus normally drains out of your sinuses. When your nasal tissues become inflamed or swollen, the mucus can become trapped or blocked so air cannot flow through your sinuses. This allows bacteria, viruses, and funguses to grow, which leads to infection. Sinusitis can develop quickly and last for 7?10 days (acute) or for more than 12 weeks (chronic). Sinusitis often develops after a cold. What are the causes? This condition is caused by anything that creates swelling in the sinuses or stops mucus from draining, including:  Allergies.  Asthma.  Bacterial or viral infection.  Abnormally shaped bones between the nasal passages.  Nasal growths that contain mucus (nasal polyps).  Narrow sinus openings.  Pollutants, such as chemicals or irritants in the air.  A foreign object stuck in the nose.  A fungal infection. This is rare.  What increases the risk? The following factors may make you more likely to develop this condition:  Having allergies or asthma.  Having had a recent cold or respiratory tract infection.  Having structural deformities or blockages in your nose or sinuses.  Having a weak immune system.  Doing a lot of swimming or diving.  Overusing nasal sprays.  Smoking.  What are the signs or symptoms? The main symptoms of this condition are pain and a feeling of  pressure around the affected sinuses. Other symptoms include:  Upper toothache.  Earache.  Headache.  Bad breath.  Decreased sense of smell and taste.  A cough that may get worse at night.  Fatigue.  Fever.  Thick drainage from your nose. The drainage is often green and it may contain pus (purulent).  Stuffy nose or congestion.  Postnasal drip. This is when extra mucus collects in the throat or back of the nose.  Swelling and warmth over the affected sinuses.  Sore throat.  Sensitivity to light.  How is this diagnosed? This condition is diagnosed based on symptoms, a medical history, and a physical exam. To find out if your condition is acute or chronic, your health care provider may:  Look in your nose for signs of nasal polyps.  Tap over the affected sinus to check for signs of infection.  View the inside of your sinuses using an imaging device that has a light attached (endoscope).  If your health care provider suspects that you have chronic sinusitis, you may also:  Be tested for allergies.  Have a sample of mucus taken from your nose (nasal culture) and checked for bacteria.  Have a mucus sample examined to see if your sinusitis is related to an allergy.  If your sinusitis does not respond to treatment and it lasts longer than 8 weeks, you may have an MRI or CT scan to check your sinuses. These scans also help to determine how severe your infection is. In rare cases, a bone biopsy may  be done to rule out more serious types of fungal sinus disease. How is this treated? Treatment for sinusitis depends on the cause and whether your condition is chronic or acute. If a virus is causing your sinusitis, your symptoms will go away on their own within 10 days. You may be given medicines to relieve your symptoms, including:  Topical nasal decongestants. They shrink swollen nasal passages and let mucus drain from your sinuses.  Antihistamines. These drugs block  inflammation that is triggered by allergies. This can help to ease swelling in your nose and sinuses.  Topical nasal corticosteroids. These are nasal sprays that ease inflammation and swelling in your nose and sinuses.  Nasal saline washes. These rinses can help to get rid of thick mucus in your nose.  If your condition is caused by bacteria, you will be given an antibiotic medicine. If your condition is caused by a fungus, you will be given an antifungal medicine. Surgery may be needed to correct underlying conditions, such as narrow nasal passages. Surgery may also be needed to remove polyps. Follow these instructions at home: Medicines  Take, use, or apply over-the-counter and prescription medicines only as told by your health care provider. These may include nasal sprays.  If you were prescribed an antibiotic medicine, take it as told by your health care provider. Do not stop taking the antibiotic even if you start to feel better. Hydrate and Humidify  Drink enough water to keep your urine clear or pale yellow. Staying hydrated will help to thin your mucus.  Use a cool mist humidifier to keep the humidity level in your home above 50%.  Inhale steam for 10-15 minutes, 3-4 times a day or as told by your health care provider. You can do this in the bathroom while a hot shower is running.  Limit your exposure to cool or dry air. Rest  Rest as much as possible.  Sleep with your head raised (elevated).  Make sure to get enough sleep each night. General instructions  Apply a warm, moist washcloth to your face 3-4 times a day or as told by your health care provider. This will help with discomfort.  Wash your hands often with soap and water to reduce your exposure to viruses and other germs. If soap and water are not available, use hand sanitizer.  Do not smoke. Avoid being around people who are smoking (secondhand smoke).  Keep all follow-up visits as told by your health care  provider. This is important. Contact a health care provider if:  You have a fever.  Your symptoms get worse.  Your symptoms do not improve within 10 days. Get help right away if:  You have a severe headache.  You have persistent vomiting.  You have pain or swelling around your face or eyes.  You have vision problems.  You develop confusion.  Your neck is stiff.  You have trouble breathing. This information is not intended to replace advice given to you by your health care provider. Make sure you discuss any questions you have with your health care provider. Document Released: 06/02/2005 Document Revised: 01/27/2016 Document Reviewed: 03/28/2015 Elsevier Interactive Patient Education  2018 [place], Adult An allergy is when your body's defense system (immune system) overreacts to an otherwise harmless substance (allergen) that you breathe in or eat or something that touches your skin. When you come into contact with something that you are allergic to, your immune system produces certain proteins (antibodies). These proteins cause  cells to release chemicals (histamines) that trigger the symptoms of an allergic reaction. Allergies often affect the nasal passages (allergic rhinitis), eyes (allergic conjunctivitis), skin (atopic dermatitis), and stomach. Allergies can be mild or severe. Allergies cannot spread from person to person (are not contagious). They can develop at any age and may be outgrown. What increases the risk? You may be at greater risk of allergies if other people in your family have allergies. What are the signs or symptoms? Symptoms depend on what type of allergy you have. They may include:  Runny, stuffy nose.  Sneezing.  Itchy mouth, ears, or throat.  Postnasal drip.  Sore throat.  Itchy, red, watery, or puffy eyes.  Skin rash or hives.  Stomach pain.  Vomiting.  Diarrhea.  Bloating.  Wheezing or coughing.  People with a  severe allergy to food, medicine, or an insect bite may have a life-threatening allergic reaction (anaphylaxis). Symptoms of anaphylaxis include:  Hives.  Itching.  Flushed face.  Swollen lips, tongue, or mouth.  Tight or swollen throat.  Chest pain or tightness in the chest.  Trouble breathing or shortness of breath.  Rapid heartbeat.  Dizziness or fainting.  Vomiting.  Diarrhea.  Pain in the abdomen.  How is this diagnosed? This condition is diagnosed based on:  Your symptoms.  Your family and medical history.  A physical exam.  You may need to see a health care provider who specializes in treating allergies (allergist). You may also have tests, including:  Skin tests to see which allergens are causing your symptoms, such as: ? Skin prick test. In this test, your skin is pricked with a tiny needle and exposed to small amounts of possible allergens to see if your skin reacts. ? Intradermal skin test. In this test, a small amount of allergen is injected under your skin to see if your skin reacts. ? Patch test. In this test, a small amount of allergen is placed on your skin and then your skin is covered with a bandage. Your health care provider will check your skin after a couple of days to see if a rash has developed.  Blood tests.  Challenges tests. In this test, you inhale a small amount of allergen by mouth to see if you have an allergic reaction.  You may also be asked to:  Keep a food diary. A food diary is a record of all the foods and drinks you have in a day and any symptoms you experience.  Practice an elimination diet. An elimination diet involves eliminating specific foods from your diet and then adding them back in one by one to find out if a certain food causes an allergic reaction.  How is this treated? Treatment for allergies depends on your symptoms. Treatment may include:  Cold compresses to soothe itching and swelling.  Eye drops.  Nasal  sprays.  Using a saline spray or container (neti pot) to flush out the nose (nasal irrigation). These methods can help clear away mucus and keep the nasal passages moist.  Using a humidifier.  Oral antihistamines or other medicines to block allergic reaction and inflammation.  Skin creams to treat rashes or itching.  Diet changes to eliminate food allergy triggers.  Repeated exposure to tiny amounts of allergens to build up a tolerance and prevent future allergic reactions (immunotherapy). These include: ? Allergy shots. ? Oral treatment. This involves taking small doses of an allergen under the tongue (sublingual immunotherapy).  Emergency epinephrine injection (auto-injector) in case of  an allergic emergency. This is a self-injectable, pre-measured medicine that must be given within the first few minutes of a serious allergic reaction.  Follow these instructions at home:  Avoid known allergens whenever possible.  If you suffer from airborne allergens, wash out your nose daily. You can do this with a saline spray or a neti pot to flush out your nose (nasal irrigation).  Take over-the-counter and prescription medicines only as told by your health care provider.  Keep all follow-up visits as told by your health care provider. This is important.  If you are at risk of a severe allergic reaction (anaphylaxis), keep your auto-injector with you at all times.  If you have ever had anaphylaxis, wear a medical alert bracelet or necklace that states you have a severe allergy. Contact a health care provider if:  Your symptoms do not improve with treatment. Get help right away if:  You have symptoms of anaphylaxis, such as: ? Swollen mouth, tongue, or throat. ? Pain or tightness in your chest. ? Trouble breathing or shortness of breath. ? Dizziness or fainting. ? Severe abdominal pain, vomiting, or diarrhea. This information is not intended to replace advice given to you by your health  care provider. Make sure you discuss any questions you have with your health care provider. Document Released: 08/26/2002 Document Revised: 10/01/2016 Document Reviewed: 12/19/2015 Elsevier Interactive Patient Education  [employer].

## 2017-12-23 NOTE — Progress Notes (Signed)
Pre visit review using our clinic review tool, if applicable. No additional management support is needed unless otherwise documented below in the visit note. 

## 2017-12-25 ENCOUNTER — Encounter (INDEPENDENT_AMBULATORY_CARE_PROVIDER_SITE_OTHER): Payer: Self-pay

## 2017-12-27 ENCOUNTER — Encounter: Payer: Self-pay | Admitting: Internal Medicine

## 2017-12-27 DIAGNOSIS — J309 Allergic rhinitis, unspecified: Secondary | ICD-10-CM | POA: Insufficient documentation

## 2017-12-27 DIAGNOSIS — R0982 Postnasal drip: Secondary | ICD-10-CM | POA: Insufficient documentation

## 2017-12-28 NOTE — Progress Notes (Signed)
Vaccine has been transferred.

## 2017-12-30 ENCOUNTER — Ambulatory Visit
Admission: RE | Admit: 2017-12-30 | Discharge: 2017-12-30 | Disposition: A | Discharge status: Home / Self Care | Payer: PPO | Source: Ambulatory Visit | Attending: Internal Medicine | Admitting: Internal Medicine

## 2017-12-30 DIAGNOSIS — R05 Cough: Secondary | ICD-10-CM | POA: Diagnosis not present

## 2017-12-30 DIAGNOSIS — J329 Chronic sinusitis, unspecified: Secondary | ICD-10-CM | POA: Diagnosis not present

## 2017-12-30 DIAGNOSIS — J3489 Other specified disorders of nose and nasal sinuses: Secondary | ICD-10-CM | POA: Diagnosis not present

## 2017-12-31 ENCOUNTER — Other Ambulatory Visit: Payer: Self-pay | Admitting: *Deleted

## 2017-12-31 ENCOUNTER — Telehealth: Payer: Self-pay | Admitting: Internal Medicine

## 2017-12-31 DIAGNOSIS — H401113 Primary open-angle glaucoma, right eye, severe stage: Secondary | ICD-10-CM | POA: Diagnosis not present

## 2017-12-31 NOTE — Telephone Encounter (Signed)
can you elaborate on this, what is the blockage?  Shouuld I call an ENT or call your office and your office schedules the ENT?  ----- Message -----   Please call [facility] and sch appt with Dr. Tami Ribas abnormal CT sinus  Pt is established   Thanks [place]

## 2017-12-31 NOTE — Patient Outreach (Signed)
[facility]) Care Management  12/31/2017  Alexis Sharp 1945-09-22 [id number]  Referral via [place]; "caller has non-urgent question about medication that PCP or specialist prescribed". Patient was advised to call his PCP when office was open.  Telephone call to patient who was advised of reason for call. Patient wanted to know what the objective was for the call. Patient was advised of reason of follow up. Patient states he had PCP and had discussed concern with PCP office.  States he has no further concerns at this time.  Plan: Case closure.   Sherrin Daisy, RN BSN [place] Management Coordinator [facility] Care Management  [phone]

## 2018-01-06 DIAGNOSIS — J342 Deviated nasal septum: Secondary | ICD-10-CM | POA: Diagnosis not present

## 2018-01-06 DIAGNOSIS — J019 Acute sinusitis, unspecified: Secondary | ICD-10-CM | POA: Diagnosis not present

## 2018-01-06 DIAGNOSIS — J3489 Other specified disorders of nose and nasal sinuses: Secondary | ICD-10-CM | POA: Diagnosis not present

## 2018-01-06 DIAGNOSIS — R0981 Nasal congestion: Secondary | ICD-10-CM | POA: Diagnosis not present

## 2018-01-06 DIAGNOSIS — J329 Chronic sinusitis, unspecified: Secondary | ICD-10-CM | POA: Diagnosis not present

## 2018-01-08 ENCOUNTER — Other Ambulatory Visit (INDEPENDENT_AMBULATORY_CARE_PROVIDER_SITE_OTHER): Payer: PPO

## 2018-01-08 DIAGNOSIS — Z0000 Encounter for general adult medical examination without abnormal findings: Secondary | ICD-10-CM | POA: Diagnosis not present

## 2018-01-08 DIAGNOSIS — E119 Type 2 diabetes mellitus without complications: Secondary | ICD-10-CM

## 2018-01-08 DIAGNOSIS — Z1159 Encounter for screening for other viral diseases: Secondary | ICD-10-CM | POA: Diagnosis not present

## 2018-01-08 DIAGNOSIS — Z125 Encounter for screening for malignant neoplasm of prostate: Secondary | ICD-10-CM

## 2018-01-08 DIAGNOSIS — E559 Vitamin D deficiency, unspecified: Secondary | ICD-10-CM

## 2018-01-08 DIAGNOSIS — Z794 Long term (current) use of insulin: Secondary | ICD-10-CM

## 2018-01-08 DIAGNOSIS — Z1329 Encounter for screening for other suspected endocrine disorder: Secondary | ICD-10-CM

## 2018-01-08 DIAGNOSIS — Z0184 Encounter for antibody response examination: Secondary | ICD-10-CM

## 2018-01-08 LAB — CBC WITH DIFFERENTIAL/PLATELET
BASOS PCT: 1.1 % (ref 0.0–3.0)
Basophils Absolute: 0.1 10*3/uL (ref 0.0–0.1)
EOS PCT: 3 % (ref 0.0–5.0)
Eosinophils Absolute: 0.1 10*3/uL (ref 0.0–0.7)
HEMATOCRIT: 43.4 % (ref 39.0–52.0)
HEMOGLOBIN: 15.1 g/dL (ref 13.0–17.0)
LYMPHS PCT: 30.5 % (ref 12.0–46.0)
Lymphs Abs: 1.5 10*3/uL (ref 0.7–4.0)
MCHC: 34.8 g/dL (ref 30.0–36.0)
MCV: 95 fl (ref 78.0–100.0)
Monocytes Absolute: 0.5 10*3/uL (ref 0.1–1.0)
Monocytes Relative: 10.6 % (ref 3.0–12.0)
Neutro Abs: 2.7 10*3/uL (ref 1.4–7.7)
Neutrophils Relative %: 54.8 % (ref 43.0–77.0)
Platelets: 200 10*3/uL (ref 150.0–400.0)
RBC: 4.57 Mil/uL (ref 4.22–5.81)
RDW: 13.4 % (ref 11.5–15.5)
WBC: 4.8 10*3/uL (ref 4.0–10.5)

## 2018-01-08 LAB — PSA, MEDICARE: PSA: 0.53 ng/mL (ref 0.10–4.00)

## 2018-01-08 LAB — LIPID PANEL
CHOLESTEROL: 161 mg/dL (ref 0–200)
HDL: 34.3 mg/dL — ABNORMAL LOW (ref >39.00)
LDL CALC: 110 mg/dL — AB (ref 0–99)
NONHDL: 126.56
Total CHOL/HDL Ratio: 5
Triglycerides: 85 mg/dL (ref 0.0–149.0)
VLDL: 17 mg/dL (ref 0.0–40.0)

## 2018-01-08 LAB — COMPREHENSIVE METABOLIC PANEL
ALBUMIN: 4.5 g/dL (ref 3.5–5.2)
ALT: 15 U/L (ref 0–53)
AST: 12 U/L (ref 0–37)
Alkaline Phosphatase: 46 U/L (ref 39–117)
BUN: 15 mg/dL (ref 6–23)
CALCIUM: 9 mg/dL (ref 8.4–10.5)
CHLORIDE: 103 meq/L (ref 96–112)
CO2: 28 mEq/L (ref 19–32)
CREATININE: 1.15 mg/dL (ref 0.40–1.50)
GFR: 66.77 mL/min (ref >60.00)
Glucose, Bld: 168 mg/dL — ABNORMAL HIGH (ref 70–99)
POTASSIUM: 4.7 meq/L (ref 3.5–5.1)
Sodium: 139 mEq/L (ref 135–145)
Total Bilirubin: 0.6 mg/dL (ref 0.2–1.2)
Total Protein: 6.9 g/dL (ref 6.0–8.3)

## 2018-01-08 LAB — VITAMIN D 25 HYDROXY (VIT D DEFICIENCY, FRACTURES): VITD: 35.27 ng/mL (ref 30.00–100.00)

## 2018-01-08 LAB — HEMOGLOBIN A1C: Hgb A1c MFr Bld: 7.1 % — ABNORMAL HIGH (ref 4.6–6.5)

## 2018-01-08 LAB — TSH: TSH: 1.63 u[IU]/mL (ref 0.35–4.50)

## 2018-01-09 LAB — MICROALBUMIN / CREATININE URINE RATIO
Creatinine, Urine: 149.4 mg/dL
MICROALBUM., U, RANDOM: 53.2 ug/mL
Microalb/Creat Ratio: 35.6 mg/g creat — ABNORMAL HIGH (ref 0.0–30.0)

## 2018-01-11 LAB — MEASLES/MUMPS/RUBELLA IMMUNITY
Mumps IgG: 92.3 AU/mL
Rubella: 5.35 index
Rubeola IgG: >300 AU/mL

## 2018-01-12 LAB — URINALYSIS, ROUTINE W REFLEX MICROSCOPIC
BILIRUBIN UA: NEGATIVE
GLUCOSE, UA: NEGATIVE
Ketones, UA: NEGATIVE
Leukocytes, UA: NEGATIVE
NITRITE UA: NEGATIVE
PH UA: 6 (ref 5.0–7.5)
PROTEIN UA: NEGATIVE
RBC, UA: NEGATIVE
SPEC GRAV UA: 1.018 (ref 1.005–1.030)
UUROB: 0.2 mg/dL (ref 0.2–1.0)

## 2018-01-19 DIAGNOSIS — L239 Allergic contact dermatitis, unspecified cause: Secondary | ICD-10-CM | POA: Diagnosis not present

## 2018-01-21 ENCOUNTER — Emergency Department
Admission: EM | Admit: 2018-01-21 | Discharge: 2018-01-21 | Disposition: A | Discharge status: Home / Self Care | Payer: PPO | Attending: Emergency Medicine | Admitting: Emergency Medicine

## 2018-01-21 ENCOUNTER — Encounter: Payer: Self-pay | Admitting: Intensive Care

## 2018-01-21 ENCOUNTER — Other Ambulatory Visit: Payer: Self-pay

## 2018-01-21 DIAGNOSIS — J301 Allergic rhinitis due to pollen: Secondary | ICD-10-CM | POA: Diagnosis not present

## 2018-01-21 DIAGNOSIS — R1310 Dysphagia, unspecified: Secondary | ICD-10-CM | POA: Insufficient documentation

## 2018-01-21 DIAGNOSIS — Z794 Long term (current) use of insulin: Secondary | ICD-10-CM | POA: Insufficient documentation

## 2018-01-21 DIAGNOSIS — J329 Chronic sinusitis, unspecified: Secondary | ICD-10-CM | POA: Diagnosis not present

## 2018-01-21 DIAGNOSIS — J324 Chronic pansinusitis: Secondary | ICD-10-CM | POA: Diagnosis not present

## 2018-01-21 DIAGNOSIS — Z7982 Long term (current) use of aspirin: Secondary | ICD-10-CM | POA: Insufficient documentation

## 2018-01-21 DIAGNOSIS — E119 Type 2 diabetes mellitus without complications: Secondary | ICD-10-CM | POA: Insufficient documentation

## 2018-01-21 DIAGNOSIS — R1319 Other dysphagia: Secondary | ICD-10-CM

## 2018-01-21 MED ORDER — GLUCAGON HCL RDNA (DIAGNOSTIC) 1 MG IJ SOLR
1.0000 mg | Freq: Once | INTRAMUSCULAR | Status: AC
  Administered 2018-01-21: 1 mg via INTRAMUSCULAR
  Filled 2018-01-21: qty 1

## 2018-01-21 NOTE — ED Notes (Signed)
Pt given crackers by MD.

## 2018-01-21 NOTE — ED Provider Notes (Signed)
[facility] Emergency Department Provider Note ____________________________________________   First MD Initiated Contact with Patient 01/21/18 1548     (approximate)  I have reviewed the triage vital signs and the nursing notes.   HISTORY  Chief Complaint No chief complaint on file.    HPI Alexis Sharp is a 72 y.o. male with PMH as noted below who presents with difficulty swallowing, acute onset this afternoon after going to the ENT, and now apparently somewhat improved.  The patient states that when he was at the NT he had spray put in his nose to anesthetize him.  He states that after this he felt like he could not swallow.  He attempted to eat lunch, but felt like the food was getting stuck in his chest and he had to throw it up.  He was then unable to drink liquids.  He states that the discomfort has improved and he now feels better although he has not tried to eat or drink anything since he felt better.  He states that at the NT he did not have any packing or other objects placed in his nose that he could have swallowed.  He reports some swallowing difficulty gradually over the last 1 to 2 months but has not yet been evaluated for this.  Past Medical History:  Diagnosis Date  . Allergy    dust, grass   . Diabetes mellitus   . Glaucoma    [place] eye q 6 months   . Hyperlipidemia   . Migraines   . Reflux esophagitis     Patient Active Problem List   Diagnosis Date Noted  . Allergic rhinitis 12/27/2017  . Post-nasal drip 12/27/2017  . Sensory loss 12/02/2016  . Diabetes mellitus type 2, controlled, without complications ([place]) 03/54/6568  . Reflux esophagitis 07/20/2014  . Advance directive discussed with patient 07/20/2014  . Routine general medical examination at a health care facility 12/25/2010  . MIGRAINE HEADACHE 11/01/2007  . Hyperlipemia 04/19/2007  . Unspecified glaucoma 04/19/2007    Past Surgical History:  Procedure Laterality  Date  . CATARACT EXTRACTION, BILATERAL    . COLONOSCOPY WITH PROPOFOL N/A 09/11/2017   Procedure: COLONOSCOPY WITH PROPOFOL;  Surgeon: Manya Silvas, MD;  Location: [facility] ENDOSCOPY;  Service: Endoscopy;  Laterality: N/A;  . EYE SURGERY    . HERNIA REPAIR    . UMBILICAL HERNIA REPAIR  2002    Prior to Admission medications   Medication Sig Start Date End Date Taking? Authorizing Provider  aspirin 81 MG chewable tablet Chew 81 mg by mouth daily.      [provider]  bimatoprost (LUMIGAN) 0.01 % SOLN Place 1 drop into both eyes at bedtime.     [provider]  Continuous Blood Gluc Sensor (FREESTYLE LIBRE 14 DAY SENSOR) MISC 1 Units by Does not apply route every 14 (fourteen) days. 06/01/17   Venia Carbon, MD  glipiZIDE (GLUCOTROL) 5 MG tablet TAKE 1 TABLET BY MOUTH TWICE DAILY BEFORE A MEAL 08/26/17   Viviana Simpler I, MD  glucose blood (ONE TOUCH ULTRA TEST) test strip USE ONE STRIP TO CHECK GLUCOSE ONCE DAILY 05/13/17   Venia Carbon, MD  insulin aspart (NOVOLOG FLEXPEN) 100 UNIT/ML FlexPen <70 0 units, 70-130 0 units,  131-180 2 units, 181-240 4 units, 241-300 6 units, 301-350 8 units, 351-400 10 units, >400 12 units and call doctor 12/23/17   McLean-Scocuzza, Nino Glow, MD  Insulin Pen Needle (PEN NEEDLES) 31G X 6 MM  MISC 1 Device by Does not apply route 3 (three) times daily as needed. 12/23/17   McLean-Scocuzza, Nino Glow, MD  Insulin Pen Needle (PEN NEEDLES) 32G X 4 MM MISC 1 Units by Does not apply route daily. Use 1 pen needle daily with Lantus 11/19/17   Viviana Simpler I, MD  ipratropium (ATROVENT) 0.06 % nasal spray Place 2 sprays into both nostrils 4 (four) times daily. 12/23/17   McLean-Scocuzza, Nino Glow, MD  metFORMIN (GLUCOPHAGE) 500 MG tablet Take 1 tablet (500 mg total) by mouth 3 (three) times daily. Patient taking differently: Take 500 mg by mouth 2 (two) times daily with a meal.  06/25/16   Venia Carbon, MD  Multiple Vitamin (MULTIVITAMIN) capsule  Take 1 capsule by mouth daily.      [provider]  [facility] DELICA LANCETS FINE MISC Use to check blood sugar once a day. Dx Code E11.9 05/02/16   Viviana Simpler I, MD  timolol (TIMOPTIC) 0.5 % ophthalmic solution Place 1 drop into both eyes daily.  05/01/15   [provider]    Allergies Codeine; Pioglitazone; Sitagliptin phosphate; Atorvastatin; and Crestor [rosuvastatin]  Family History  Problem Relation Age of Onset  . Diabetes Mother   . Diabetes Sister   . Heart disease Brother   . Diabetes Brother   . Heart disease Brother   . Diabetes Brother   . Diabetes Brother   . Diabetes Sister   . Diabetes Sister     Social History Social History   Tobacco Use  . Smoking status: Former Research scientist (life sciences)  . Smokeless tobacco: Never Used  Substance Use Topics  . Alcohol use: No    Alcohol/week: 0.0 standard drinks  . Drug use: No    Review of Systems  Constitutional: No fever. Eyes: No redness. ENT: No sore throat. Cardiovascular: Denies chest pain. Respiratory: Denies shortness of breath. Gastrointestinal: Positive for vomiting.  Genitourinary: Negative for flank pain.  Musculoskeletal: Negative for back pain. Skin: Negative for rash. Neurological: Negative for headache.  ____________________________________________   PHYSICAL EXAM:  VITAL SIGNS: ED Triage Vitals  Enc Vitals Group     BP 01/21/18 1406 (!) 164/84     Pulse Rate 01/21/18 1406 64     Resp 01/21/18 1406 15     Temp 01/21/18 1406 98.3 F (36.8 C)     Temp Source 01/21/18 1406 Oral     SpO2 01/21/18 1406 96 %     Weight 01/21/18 1407 215 lb (97.5 kg)     Height 01/21/18 1407 5\' 11"  (1.803 m)     Head Circumference --      Peak Flow --      Pain Score 01/21/18 1424 0     Pain Loc --      Pain Edu? --      Excl. in [place]? --     Constitutional: Alert and oriented. Well appearing and in no acute distress. Eyes: Conjunctivae are normal.  Head: Atraumatic. Nose: No  congestion/rhinnorhea. Mouth/Throat: Mucous membranes are moist.  Oropharynx clear with no pooled secretions. Neck: Normal range of motion.  Cardiovascular: Normal rate, regular rhythm. Grossly normal heart sounds.  Good peripheral circulation. Respiratory: Normal respiratory effort.  No retractions. Lungs CTAB. Gastrointestinal: No distention.  Musculoskeletal: Extremities warm and well perfused.  Neurologic:  Normal speech and language. No gross focal neurologic deficits are appreciated.  Skin:  Skin is warm and dry. No rash noted. Psychiatric: Mood and affect are normal. Speech and behavior are normal.  ____________________________________________   LABS (all labs ordered are listed, but only abnormal results are displayed)  Labs Reviewed - No data to display ____________________________________________  EKG   ____________________________________________  RADIOLOGY    ____________________________________________   PROCEDURES  Procedure(s) performed: No  Procedures  Critical Care performed: No ____________________________________________   INITIAL IMPRESSION / ASSESSMENT AND PLAN / ED COURSE  Pertinent labs & imaging results that were available during my care of the patient were reviewed by me and considered in my medical decision making (see chart for details).  72 year old male with PMH as noted above including diabetes and reflux esophagitis presents with difficulty swallowing when he tried to eat after ENT evaluation.  Patient reports a sensation of food getting stuck in his chest, and had to vomit.  He states that he is feeling somewhat better now after waiting to be seen, but has not tried to eat or drink anything.  He is not spitting out his saliva.  He reports some swallowing difficulty gradually over the last few months, but no acute episodes like this.    On exam, the patient is well appearing, vital signs are normal except for hypertension, and he has no  pooled secretions or active vomiting.   Differential includes esophagitis, decreased esophageal motility due to diabetes, or less likely acute impaction.  We will give IM glucagon and then trial of PO.  If patient unable to tolerate PO, we will consult GI in the ED.  If tolerating PO, likely close outpatient GI followup.   ----------------------------------------- 6:47 PM on 01/21/2018 -----------------------------------------  The patient continued to feel well after glucagon.  I gave him ginger ale and he was able to drink the whole can and then a few glasses of water without difficulty.  I then gave him crackers and he ate these without any recurrence of his symptoms.  I paged out to Dr. Vicente Males to arrange for follow-up, although I have not yet heard back.  The patient feels comfortable and would like to go home.  I will provide referral information in the patient's discharge paperwork.  I counseled him on the likely diagnoses and the plan of care.  The patient states he just started taking omeprazole few days ago, so I instructed him to continue this.  He agrees to follow-up with GI.  I also counseled him on eating soft foods and cutting up his food thoroughly as well as chewing it well.  Return precautions given, and he expresses understanding.  ____________________________________________   FINAL CLINICAL IMPRESSION(S) / ED DIAGNOSES  Final diagnoses:  Esophageal dysphagia      NEW MEDICATIONS STARTED DURING THIS VISIT:  New Prescriptions   No medications on file     Note:  This document was prepared using Dragon voice recognition software and may include unintentional dictation errors.   Arta Silence, MD 01/21/18 [phone]

## 2018-01-21 NOTE — ED Notes (Signed)
Pt given ginger ale. Tolerated well. ABCs intact.

## 2018-01-21 NOTE — ED Notes (Signed)
Per Schaevitz no orders in triage

## 2018-01-21 NOTE — Discharge Instructions (Addendum)
Your symptoms are most consistent with inflammation esophagus, and esophageal spasm, or problems with the motility of the esophagus related to your diabetes.  Continue to take your omeprazole.  Make sure to cut up her food into small pieces, chew thoroughly, and eat soft foods.  Call Dr. Georgeann Oppenheim office tomorrow to arrange for a follow-up within the next several days to 1 week.  Return to the ER for new, worsening, persistent severe chest discomfort, inability to swallow, inability to hold down liquids or your saliva, shortness of breath, fevers, or any other new or worsening symptoms that concern you.

## 2018-01-21 NOTE — ED Triage Notes (Signed)
Patient reports difficulty swallowing X3 days. Patient was seen at ENT this morning for his sinuses and reports he put something in his nose and it went down his throat. Patient reports he tried eating lunch today and vomited it back up and reports he cannot even get water down. Last tried taking a sip at 12:30. Patient denies talking with ENT about swallowing problems. Denies trouble breathing. Unlabored breathing. Patients wife reports the last couple of months he has to take a swallow of liquid before food to get the food down.

## 2018-01-21 NOTE — ED Notes (Signed)
MD at bedside. 

## 2018-01-27 ENCOUNTER — Ambulatory Visit (INDEPENDENT_AMBULATORY_CARE_PROVIDER_SITE_OTHER): Payer: PPO | Admitting: Gastroenterology

## 2018-01-27 ENCOUNTER — Encounter: Payer: Self-pay | Admitting: Gastroenterology

## 2018-01-27 ENCOUNTER — Other Ambulatory Visit: Payer: Self-pay

## 2018-01-27 VITALS — BP 137/72 | HR 72 | Ht 71.0 in | Wt 226.4 lb

## 2018-01-27 DIAGNOSIS — R1310 Dysphagia, unspecified: Secondary | ICD-10-CM

## 2018-01-27 DIAGNOSIS — K219 Gastro-esophageal reflux disease without esophagitis: Secondary | ICD-10-CM | POA: Insufficient documentation

## 2018-01-27 MED ORDER — OMEPRAZOLE 40 MG PO CPDR: 40.0000 mg | DELAYED_RELEASE_CAPSULE | Freq: Every day | ORAL | 3 refills | Status: DC

## 2018-01-27 NOTE — Progress Notes (Signed)
[name] MD, MRCP(U.K) [address]  [place]  [place], [place] [address]  Main: [phone]  Fax: [phone]   Gastroenterology Consultation  Referring Provider:     McLean-Scocuzza, Olivia Mackie * Primary Care Physician:  McLean-Scocuzza, Nino Glow, MD Primary Gastroenterologist:  Dr. [name]  Reason for Consultation:  Dysphagia         HPI:   Alexis Sharp is a 72 y.o. y/o male referred for consultation & management  by Dr. Terese Door, Nino Glow, MD.     He has been referred from the ER where he was seen on 01/21/18 for difficulty swallowing . Began after he saw ENT and had a spray in his mouth , had lunch and felt it was stuck in his throat ,given IM glucagon , felt better and d/c  .He says he saw ENT for recurrence sinus infections.   Similar episode 5-10 years back, similar episode. Did not need urgent endoscopy. Had an endoscopy later which showed "acid reflux".   He says he has not been on any treatment for the same. Last few days has been taking omeprazole.   Denies any heartburn, no chest discomfort. Last night for the first time felt some liquid in his throat and was burning .Marland Kitchen   Food going down ok. Some history of asthma, gained weight over the past 1-2 years, gained 20 lbs. Bed time 9-10 pm , dinner at before 5 pm , sometime has a lot of snacks at night .    On omeprazole unsure of dose got at [facility]. .    Past Medical History:  Diagnosis Date  . Allergy    dust, grass   . Diabetes mellitus   . Glaucoma    [place] eye q 6 months   . Hyperlipidemia   . Migraines   . Reflux esophagitis     Past Surgical History:  Procedure Laterality Date  . CATARACT EXTRACTION, BILATERAL    . COLONOSCOPY WITH PROPOFOL N/A 09/11/2017   Procedure: COLONOSCOPY WITH PROPOFOL;  Surgeon: Manya Silvas, MD;  Location: [facility] ENDOSCOPY;  Service: Endoscopy;  Laterality: N/A;  . EYE SURGERY    . HERNIA REPAIR    . UMBILICAL HERNIA REPAIR  2002    Prior to  Admission medications   Medication Sig Start Date End Date Taking? Authorizing Provider  aspirin 81 MG chewable tablet Chew 81 mg by mouth daily.      [provider]  bimatoprost (LUMIGAN) 0.01 % SOLN Place 1 drop into both eyes at bedtime.     [provider]  Continuous Blood Gluc Sensor (FREESTYLE LIBRE 14 DAY SENSOR) MISC 1 Units by Does not apply route every 14 (fourteen) days. 06/01/17   Venia Carbon, MD  glipiZIDE (GLUCOTROL) 5 MG tablet TAKE 1 TABLET BY MOUTH TWICE DAILY BEFORE A MEAL 08/26/17   Viviana Simpler I, MD  glucose blood (ONE TOUCH ULTRA TEST) test strip USE ONE STRIP TO CHECK GLUCOSE ONCE DAILY 05/13/17   Venia Carbon, MD  insulin aspart (NOVOLOG FLEXPEN) 100 UNIT/ML FlexPen <70 0 units, 70-130 0 units,  131-180 2 units, 181-240 4 units, 241-300 6 units, 301-350 8 units, 351-400 10 units, >400 12 units and call doctor 12/23/17   McLean-Scocuzza, Nino Glow, MD  Insulin Pen Needle (PEN NEEDLES) 31G X 6 MM MISC 1 Device by Does not apply route 3 (three) times daily as needed. 12/23/17   McLean-Scocuzza, Nino Glow, MD  Insulin Pen Needle (PEN NEEDLES) 32G X 4  MM MISC 1 Units by Does not apply route daily. Use 1 pen needle daily with Lantus 11/19/17   Viviana Simpler I, MD  ipratropium (ATROVENT) 0.06 % nasal spray Place 2 sprays into both nostrils 4 (four) times daily. 12/23/17   McLean-Scocuzza, Nino Glow, MD  metFORMIN (GLUCOPHAGE) 500 MG tablet Take 1 tablet (500 mg total) by mouth 3 (three) times daily. Patient taking differently: Take 500 mg by mouth 2 (two) times daily with a meal.  06/25/16   Venia Carbon, MD  Multiple Vitamin (MULTIVITAMIN) capsule Take 1 capsule by mouth daily.      [provider]  [facility] DELICA LANCETS FINE MISC Use to check blood sugar once a day. Dx Code E11.9 05/02/16   Viviana Simpler I, MD  timolol (TIMOPTIC) 0.5 % ophthalmic solution Place 1 drop into both eyes daily.  05/01/15   [provider]    Family  History  Problem Relation Age of Onset  . Diabetes Mother   . Diabetes Sister   . Heart disease Brother   . Diabetes Brother   . Heart disease Brother   . Diabetes Brother   . Diabetes Brother   . Diabetes Sister   . Diabetes Sister      Social History   Tobacco Use  . Smoking status: Former Research scientist (life sciences)  . Smokeless tobacco: Never Used  Substance Use Topics  . Alcohol use: No    Alcohol/week: 0.0 standard drinks  . Drug use: No    Allergies as of 01/27/2018 - Review Complete 01/21/2018  Allergen Reaction Noted  . Codeine  04/19/2007  . Pioglitazone  04/19/2007  . Sitagliptin phosphate  04/19/2007  . Atorvastatin Other (See Comments) 11/14/2015  . Crestor [rosuvastatin]  02/24/2012    Review of Systems:    All systems reviewed and negative except where noted in HPI.   Physical Exam:  There were no vitals taken for this visit. No LMP for male patient. Psych:  Alert and cooperative. Normal mood and affect. General:   Alert,  Well-developed, well-nourished, pleasant and cooperative in NAD Head:  Normocephalic and atraumatic. Eyes:  Sclera clear, no icterus.   Conjunctiva pink. Ears:  Normal auditory acuity. Nose:  No deformity, discharge, or lesions. Mouth:  No deformity or lesions,oropharynx pink & moist. Neck:  Supple; no masses or thyromegaly. Lungs:  Respirations even and unlabored.  Clear throughout to auscultation.   No wheezes, crackles, or rhonchi. No acute distress. Heart:  Regular rate and rhythm; no murmurs, clicks, rubs, or gallops. Abdomen:  Normal bowel sounds.  No bruits.  Soft, non-tender and non-distended without masses, hepatosplenomegaly or hernias noted.  No guarding or rebound tenderness.    Neurologic:  Alert and oriented x3;  grossly normal neurologically. Skin:  Intact without significant lesions or rashes. No jaundice. Lymph Nodes:  No significant cervical adenopathy. Psych:  Alert and cooperative. Normal mood and affect.  Imaging Studies: Ct  Maxillofacial Wo Cm  Result Date: 12/30/2017 CLINICAL DATA:  Productive cough. Sinus drainage. Unable to sleep because of drainage. EXAM: CT MAXILLOFACIAL WITHOUT CONTRAST TECHNIQUE: Multidetector CT images of the paranasal sinuses were obtained using the standard protocol without intravenous contrast. COMPARISON:  None. FINDINGS: Paranasal sinuses: Frontal: Left frontal sinus is clear. Right frontal sinus is nearly completely opacified. Ethmoid: Left ethmoid sinuses are clear. Complete opacification of the anterior ethmoid sinuses on the right. Maxillary: Left maxillary sinus is clear. Near complete opacification of the right maxillary sinus. Sphenoid: Normal Right ostiomeatal unit: Completely obscured  by inflammatory change. Left ostiomeatal unit: Infundibulum is patent. Small adjacent Haller cells. Nasal passages: Opacification of the upper nasal passages on the right. Lower right nasal passages are patent. Left nasal passages are entirely patent. Septum bows 2 mm towards the right. Anatomy: No pneumatization superior to anterior ethmoid notches. Symmetric and intact olfactory grooves and fovea ethmoidalis, Keros I (1-17mm). Sellar sphenoid pneumatization pattern. No dehiscence of carotid or optic canals. No onodi cell. Other: None IMPRESSION: Ostiomeatal complex obstructive pattern on the right with opacification of the right maxillary sinus, right anterior ethmoid sinuses and right frontal sinus. Electronically Signed   By: Nelson Chimes M.D.   On: 12/30/2017 13:01    Assessment and Plan:   Mylan Schwarz is a 72 y.o. y/o male has been referred for dysphagia.   Plan  1. EGD- +/- dilation, R/o EOE 2. GERD patient information    I have discussed alternative options, risks & benefits,  which include, but are not limited to, bleeding, infection, perforation,respiratory complication & drug reaction.  The patient agrees with this plan & written consent will be obtained.     Follow up in 6 weeks   Dr  [name] MD,MRCP(U.K)

## 2018-01-29 ENCOUNTER — Other Ambulatory Visit: Payer: PPO

## 2018-02-01 ENCOUNTER — Encounter: Payer: Self-pay | Admitting: *Deleted

## 2018-02-01 ENCOUNTER — Telehealth: Payer: Self-pay | Admitting: Gastroenterology

## 2018-02-01 ENCOUNTER — Ambulatory Visit: Payer: PPO | Admitting: Internal Medicine

## 2018-02-01 NOTE — Telephone Encounter (Signed)
Left vm informing pt's wife that Dr. Georgeann Oppenheim start time has been adjusted for tomorrow which is why he is not a very early morning scope.

## 2018-02-01 NOTE — Telephone Encounter (Signed)
Pt wife left vm for Ginger she states she had spoken with Ginger about pt procedure time and they called her today and it was different pt wife would like a call please [phone]

## 2018-02-02 ENCOUNTER — Other Ambulatory Visit: Payer: Self-pay

## 2018-02-02 ENCOUNTER — Ambulatory Visit
Admission: RE | Admit: 2018-02-02 | Discharge: 2018-02-02 | Disposition: A | Discharge status: Home / Self Care | Payer: PPO | Source: Ambulatory Visit | Attending: Gastroenterology | Admitting: Gastroenterology

## 2018-02-02 ENCOUNTER — Encounter: Payer: Self-pay | Admitting: *Deleted

## 2018-02-02 ENCOUNTER — Ambulatory Visit: Payer: PPO | Admitting: Anesthesiology

## 2018-02-02 ENCOUNTER — Encounter
Admission: RE | Disposition: A | Discharge status: Home / Self Care | Payer: Self-pay | Source: Ambulatory Visit | Attending: Gastroenterology

## 2018-02-02 DIAGNOSIS — Z9842 Cataract extraction status, left eye: Secondary | ICD-10-CM | POA: Diagnosis not present

## 2018-02-02 DIAGNOSIS — Z87891 Personal history of nicotine dependence: Secondary | ICD-10-CM | POA: Insufficient documentation

## 2018-02-02 DIAGNOSIS — H409 Unspecified glaucoma: Secondary | ICD-10-CM | POA: Diagnosis not present

## 2018-02-02 DIAGNOSIS — E1139 Type 2 diabetes mellitus with other diabetic ophthalmic complication: Secondary | ICD-10-CM | POA: Insufficient documentation

## 2018-02-02 DIAGNOSIS — Z794 Long term (current) use of insulin: Secondary | ICD-10-CM | POA: Diagnosis not present

## 2018-02-02 DIAGNOSIS — Z888 Allergy status to other drugs, medicaments and biological substances status: Secondary | ICD-10-CM | POA: Insufficient documentation

## 2018-02-02 DIAGNOSIS — R1310 Dysphagia, unspecified: Secondary | ICD-10-CM | POA: Diagnosis not present

## 2018-02-02 DIAGNOSIS — E119 Type 2 diabetes mellitus without complications: Secondary | ICD-10-CM | POA: Diagnosis not present

## 2018-02-02 DIAGNOSIS — K210 Gastro-esophageal reflux disease with esophagitis: Secondary | ICD-10-CM | POA: Diagnosis not present

## 2018-02-02 DIAGNOSIS — Z8249 Family history of ischemic heart disease and other diseases of the circulatory system: Secondary | ICD-10-CM | POA: Insufficient documentation

## 2018-02-02 DIAGNOSIS — K229 Disease of esophagus, unspecified: Secondary | ICD-10-CM | POA: Diagnosis not present

## 2018-02-02 DIAGNOSIS — Z885 Allergy status to narcotic agent status: Secondary | ICD-10-CM | POA: Insufficient documentation

## 2018-02-02 DIAGNOSIS — K449 Diaphragmatic hernia without obstruction or gangrene: Secondary | ICD-10-CM | POA: Insufficient documentation

## 2018-02-02 DIAGNOSIS — K219 Gastro-esophageal reflux disease without esophagitis: Secondary | ICD-10-CM | POA: Insufficient documentation

## 2018-02-02 DIAGNOSIS — Z9841 Cataract extraction status, right eye: Secondary | ICD-10-CM | POA: Insufficient documentation

## 2018-02-02 DIAGNOSIS — Z91048 Other nonmedicinal substance allergy status: Secondary | ICD-10-CM | POA: Diagnosis not present

## 2018-02-02 DIAGNOSIS — E785 Hyperlipidemia, unspecified: Secondary | ICD-10-CM | POA: Diagnosis not present

## 2018-02-02 HISTORY — PX: ESOPHAGOGASTRODUODENOSCOPY (EGD) WITH PROPOFOL: SHX5813

## 2018-02-02 LAB — GLUCOSE, CAPILLARY: Glucose-Capillary: 163 mg/dL — ABNORMAL HIGH (ref 70–99)

## 2018-02-02 SURGERY — ESOPHAGOGASTRODUODENOSCOPY (EGD) WITH PROPOFOL
Anesthesia: General

## 2018-02-02 MED ORDER — GLYCOPYRROLATE 0.2 MG/ML IJ SOLN
INTRAMUSCULAR | Status: DC | PRN
  Administered 2018-02-02: 0.2 mg via INTRAVENOUS

## 2018-02-02 MED ORDER — PROPOFOL 10 MG/ML IV BOLUS
INTRAVENOUS | Status: DC | PRN
  Administered 2018-02-02: 70 mg via INTRAVENOUS

## 2018-02-02 MED ORDER — LIDOCAINE HCL (PF) 2 % IJ SOLN
INTRAMUSCULAR | Status: AC
  Filled 2018-02-02: qty 10

## 2018-02-02 MED ORDER — PROPOFOL 10 MG/ML IV BOLUS
INTRAVENOUS | Status: AC
  Filled 2018-02-02: qty 20

## 2018-02-02 MED ORDER — SODIUM CHLORIDE 0.9 % IV SOLN
INTRAVENOUS | Status: DC
  Administered 2018-02-02: 08:00:00 via INTRAVENOUS

## 2018-02-02 MED ORDER — GLYCOPYRROLATE 0.2 MG/ML IJ SOLN
INTRAMUSCULAR | Status: AC
  Filled 2018-02-02: qty 1

## 2018-02-02 MED ORDER — PROPOFOL 500 MG/50ML IV EMUL
INTRAVENOUS | Status: AC
  Filled 2018-02-02: qty 150

## 2018-02-02 MED ORDER — SUCCINYLCHOLINE CHLORIDE 20 MG/ML IJ SOLN
INTRAMUSCULAR | Status: AC
  Filled 2018-02-02: qty 1

## 2018-02-02 MED ORDER — LIDOCAINE HCL (CARDIAC) PF 100 MG/5ML IV SOSY
PREFILLED_SYRINGE | INTRAVENOUS | Status: DC | PRN
  Administered 2018-02-02: 60 mg via INTRAVENOUS

## 2018-02-02 MED ORDER — PROPOFOL 500 MG/50ML IV EMUL
INTRAVENOUS | Status: DC | PRN
  Administered 2018-02-02: 140 ug/kg/min via INTRAVENOUS

## 2018-02-02 MED ORDER — PROPOFOL 500 MG/50ML IV EMUL
INTRAVENOUS | Status: AC
  Filled 2018-02-02: qty 100

## 2018-02-02 NOTE — Anesthesia Post-op Follow-up Note (Signed)
Anesthesia QCDR form completed.        

## 2018-02-02 NOTE — Anesthesia Postprocedure Evaluation (Signed)
Anesthesia Post Note  Patient: Amado Nash  Procedure(s) Performed: ESOPHAGOGASTRODUODENOSCOPY (EGD) WITH PROPOFOL (N/A )  Patient location during evaluation: Endoscopy Anesthesia Type: General Level of consciousness: awake and alert Pain management: pain level controlled Vital Signs Assessment: post-procedure vital signs reviewed and stable Respiratory status: spontaneous breathing, nonlabored ventilation, respiratory function stable and patient connected to nasal cannula oxygen Cardiovascular status: blood pressure returned to baseline and stable Postop Assessment: no apparent nausea or vomiting Anesthetic complications: no     Last Vitals:  Vitals:   02/02/18 0918 02/02/18 0928  BP: (!) 151/105   Pulse: (!) 58 (!) 57  Resp: 16 14  Temp:    SpO2: 98% 97%    Last Pain:  Vitals:   02/02/18 0928  TempSrc:   PainSc: 0-No pain                 Precious Haws Raechel Marcos

## 2018-02-02 NOTE — H&P (Signed)
[name], MD [address], [place], [place], [place], [address] [address], [place], [place], [place], [address] Phone: [phone]  Fax: [phone]  Primary Care Physician:  McLean-Scocuzza, Nino Glow, MD   Pre-Procedure History & Physical: HPI:  Alexis Sharp is a 72 y.o. male is here for an endoscopy    Past Medical History:  Diagnosis Date  . Allergy    dust, grass   . Diabetes mellitus   . Glaucoma    [place] eye q 6 months   . Hyperlipidemia   . Migraines   . Reflux esophagitis     Past Surgical History:  Procedure Laterality Date  . CATARACT EXTRACTION, BILATERAL    . COLONOSCOPY WITH PROPOFOL N/A 09/11/2017   Procedure: COLONOSCOPY WITH PROPOFOL;  Surgeon: Manya Silvas, MD;  Location: [facility] ENDOSCOPY;  Service: Endoscopy;  Laterality: N/A;  . EYE SURGERY    . HERNIA REPAIR    . UMBILICAL HERNIA REPAIR  2002    Prior to Admission medications   Medication Sig Start Date End Date Taking? Authorizing Provider  bimatoprost (LUMIGAN) 0.01 % SOLN Place 1 drop into both eyes at bedtime.    Yes [provider]  Continuous Blood Gluc Sensor (FREESTYLE LIBRE 14 DAY SENSOR) MISC 1 Units by Does not apply route every 14 (fourteen) days. 06/01/17  Yes Viviana Simpler I, MD  glipiZIDE (GLUCOTROL) 5 MG tablet TAKE 1 TABLET BY MOUTH TWICE DAILY BEFORE A MEAL 08/26/17  Yes Viviana Simpler I, MD  glucose blood (ONE TOUCH ULTRA TEST) test strip USE ONE STRIP TO CHECK GLUCOSE ONCE DAILY 05/13/17  Yes Venia Carbon, MD  insulin aspart (NOVOLOG FLEXPEN) 100 UNIT/ML FlexPen <70 0 units, 70-130 0 units,  131-180 2 units, 181-240 4 units, 241-300 6 units, 301-350 8 units, 351-400 10 units, >400 12 units and call doctor 12/23/17  Yes McLean-Scocuzza, Nino Glow, MD  insulin glargine (LANTUS) 100 UNIT/ML injection Inject into the skin daily.   Yes [provider]  Insulin Pen Needle (PEN NEEDLES) 31G X 6 MM MISC 1 Device by Does not apply route 3 (three)  times daily as needed. 12/23/17  Yes McLean-Scocuzza, Nino Glow, MD  Insulin Pen Needle (PEN NEEDLES) 32G X 4 MM MISC 1 Units by Does not apply route daily. Use 1 pen needle daily with Lantus 11/19/17  Yes Venia Carbon, MD  metFORMIN (GLUCOPHAGE) 500 MG tablet Take 1 tablet (500 mg total) by mouth 3 (three) times daily. Patient taking differently: Take 500 mg by mouth 2 (two) times daily with a meal.  06/25/16  Yes Venia Carbon, MD  omeprazole (PRILOSEC) 40 MG capsule Take 1 capsule (40 mg total) by mouth daily. 01/27/18  Yes [name], MD  [facility] DELICA LANCETS FINE MISC Use to check blood sugar once a day. Dx Code E11.9 05/02/16  Yes Venia Carbon, MD  timolol (TIMOPTIC) 0.5 % ophthalmic solution Place 1 drop into both eyes daily.  05/01/15  Yes [provider]  aspirin 81 MG chewable tablet Chew 81 mg by mouth daily.      [provider]  cefdinir (OMNICEF) 300 MG capsule TK 1 C PO BID 01/25/18   [provider]  ipratropium (ATROVENT) 0.06 % nasal spray Place 2 sprays into both nostrils 4 (four) times daily. Patient not taking: Reported on 01/27/2018 12/23/17   McLean-Scocuzza, Nino Glow, MD  Multiple Vitamin (MULTIVITAMIN) capsule Take 1 capsule by mouth daily.  [provider]  predniSONE (STERAPRED UNI-PAK 21 TAB) 10 MG (21) TBPK tablet TK UTD 01/25/18   [provider]    Allergies as of 01/27/2018 - Review Complete 01/27/2018  Allergen Reaction Noted  . Codeine  04/19/2007  . Pioglitazone  04/19/2007  . Sitagliptin phosphate  04/19/2007  . Atorvastatin Other (See Comments) 11/14/2015  . Crestor [rosuvastatin]  02/24/2012    Family History  Problem Relation Age of Onset  . Diabetes Mother   . Diabetes Sister   . Heart disease Brother   . Diabetes Brother   . Heart disease Brother   . Diabetes Brother   . Diabetes Brother   . Diabetes Sister   . Diabetes Sister     Social History   Socioeconomic History  . Marital  status: Married    Spouse name: Not on file  . Number of children: 2  . Years of education: Not on file  . Highest education level: Not on file  Occupational History  . Occupation: semi retired Museum/gallery curator asst,  does Engineer, manufacturing: SELF EMPLOYED  Social Needs  . Financial resource strain: Not on file  . Food insecurity:    Worry: Not on file    Inability: Not on file  . Transportation needs:    Medical: Not on file    Non-medical: Not on file  Tobacco Use  . Smoking status: Former Research scientist (life sciences)  . Smokeless tobacco: Never Used  Substance and Sexual Activity  . Alcohol use: No    Alcohol/week: 0.0 standard drinks  . Drug use: No  . Sexual activity: Yes  Lifestyle  . Physical activity:    Days per week: Not on file    Minutes per session: Not on file  . Stress: Not on file  Relationships  . Social connections:    Talks on phone: Not on file    Gets together: Not on file    Attends religious service: Not on file    Active member of club or organization: Not on file    Attends meetings of clubs or organizations: Not on file    Relationship status: Not on file  . Intimate partner violence:    Fear of current or ex partner: Not on file    Emotionally abused: Not on file    Physically abused: Not on file    Forced sexual activity: Not on file  Other Topics Concern  . Not on file  Social History Narrative   No living will   No health care POA---okay with wife making decisions. Daughter Joelene Millin would be alternate   Would accept resuscitation   Not sure about tube feeds    Review of Systems: See HPI, otherwise negative ROS  Physical Exam: BP (!) 152/81   Pulse (!) 58   Temp 97.7 F (36.5 C)   Resp 16   Ht 5\' 11"  (1.803 m)   Wt 99.8 kg   SpO2 99%   BMI 30.68 kg/m  General:   Alert,  pleasant and cooperative in NAD Head:  Normocephalic and atraumatic. Neck:  Supple; no masses or thyromegaly. Lungs:  Clear throughout to auscultation, normal  respiratory effort.    Heart:  +S1, +S2, Regular rate and rhythm, No edema. Abdomen:  Soft, nontender and nondistended. Normal bowel sounds, without guarding, and without rebound.   Neurologic:  Alert and  oriented x4;  grossly normal neurologically.  Impression/Plan: Alexis Sharp is here for an endoscopy  to be performed  for  evaluation of dysphagia     Risks, benefits, limitations, and alternatives regarding endoscopy and dilation have been reviewed with the patient.  Questions have been answered.  All parties agreeable.   [name], MD  02/02/2018, 8:44 AM

## 2018-02-02 NOTE — Anesthesia Preprocedure Evaluation (Signed)
Anesthesia Evaluation  Patient identified by MRN, date of birth, ID band Patient awake    Reviewed: Allergy & Precautions, H&P , NPO status , Patient's Chart, lab work & pertinent test results  History of Anesthesia Complications Negative for: history of anesthetic complications  Airway Mallampati: III  TM Distance: <3 FB Neck ROM: limited    Dental  (+) Chipped, Missing, Poor Dentition   Pulmonary neg pulmonary ROS, neg shortness of breath, former smoker,           Cardiovascular (-) angina(-) Past MI and (-) DOE      Neuro/Psych  Headaches, negative psych ROS   GI/Hepatic Neg liver ROS, GERD  Medicated and Controlled,  Endo/Other  diabetes, Type 2  Renal/GU negative Renal ROS  negative genitourinary   Musculoskeletal   Abdominal   Peds  Hematology negative hematology ROS (+)   Anesthesia Other Findings Past Medical History: No date: Allergy     Comment:  dust, grass  No date: Diabetes mellitus No date: Glaucoma     Comment:  [place] eye q 6 months  No date: Hyperlipidemia No date: Migraines No date: Reflux esophagitis  Past Surgical History: No date: CATARACT EXTRACTION, BILATERAL 09/11/2017: COLONOSCOPY WITH PROPOFOL; N/A     Comment:  Procedure: COLONOSCOPY WITH PROPOFOL;  Surgeon: Manya Silvas, MD;  Location: [facility] ENDOSCOPY;  Service:               Endoscopy;  Laterality: N/A; No date: EYE SURGERY No date: HERNIA REPAIR 5638: UMBILICAL HERNIA REPAIR  BMI    Body Mass Index:  30.68 kg/m      Reproductive/Obstetrics negative OB ROS                             Anesthesia Physical Anesthesia Plan  ASA: III  Anesthesia Plan: General   Post-op Pain Management:    Induction: Intravenous  PONV Risk Score and Plan: Propofol infusion and TIVA  Airway Management Planned: Natural Airway and Nasal Cannula  Additional Equipment:   Intra-op Plan:    Post-operative Plan:   Informed Consent: I have reviewed the patients History and Physical, chart, labs and discussed the procedure including the risks, benefits and alternatives for the proposed anesthesia with the patient or authorized representative who has indicated his/her understanding and acceptance.   Dental Advisory Given  Plan Discussed with: Anesthesiologist, CRNA and Surgeon  Anesthesia Plan Comments: (Patient consented for risks of anesthesia including but not limited to:  - adverse reactions to medications - risk of intubation if required - damage to teeth, lips or other oral mucosa - sore throat or hoarseness - Damage to heart, brain, lungs or loss of life  Patient voiced understanding.)        Anesthesia Quick Evaluation

## 2018-02-02 NOTE — Transfer of Care (Signed)
Immediate Anesthesia Transfer of Care Note  Patient: Alexis Sharp  Procedure(s) Performed: ESOPHAGOGASTRODUODENOSCOPY (EGD) WITH PROPOFOL (N/A )  Patient Location: PACU  Anesthesia Type:General  Level of Consciousness: sedated  Airway & Oxygen Therapy: Patient Spontanous Breathing and Patient connected to nasal cannula oxygen  Post-op Assessment: Report given to RN and Post -op Vital signs reviewed and stable  Post vital signs: Reviewed and stable  Last Vitals:  Vitals Value Taken Time  BP 123/81 02/02/2018  9:00 AM  Temp 35.9 C 02/02/2018  9:00 AM  Pulse 70 02/02/2018  9:00 AM  Resp 23 02/02/2018  9:00 AM  SpO2 95 % 02/02/2018  9:00 AM    Last Pain:  Vitals:   02/02/18 0858  TempSrc: Tympanic  PainSc:          Complications: No apparent anesthesia complications

## 2018-02-02 NOTE — Op Note (Signed)
[facility] Patient Name: Alexis Sharp Procedure Date: 02/02/2018 8:45 AM MRN: [id number] Account #: [id number] Date of Birth: 1945-11-10 Admit Type: Outpatient Age: 72 Room: [facility] ENDO ROOM 1 Gender: Male Note Status: Finalized Procedure:            Upper GI endoscopy Indications:          Dysphagia Providers:            [name] MD, MD Referring MD:         Nino Glow Mclean-Scocuzza MD, MD (Referring MD) Medicines:            Monitored Anesthesia Care Complications:        No immediate complications. Procedure:            Pre-Anesthesia Assessment:                       - Prior to the procedure, a History and Physical was                        performed, and patient medications, allergies and                        sensitivities were reviewed. The patient's tolerance of                        previous anesthesia was reviewed.                       - The risks and benefits of the procedure and the                        sedation options and risks were discussed with the                        patient. All questions were answered and informed                        consent was obtained.                       - ASA Grade Assessment: II - A patient with mild                        systemic disease.                       After obtaining informed consent, the endoscope was                        passed under direct vision. Throughout the procedure,                        the patient's blood pressure, pulse, and oxygen                        saturations were monitored continuously. The Endoscope                        was introduced through the mouth, and advanced to the  third part of duodenum. The upper GI endoscopy was                        accomplished with ease. The patient tolerated the                        procedure well. Findings:      The examined duodenum was normal.      A 5 cm hiatal hernia was present.  The cardia and gastric fundus were normal on retroflexion.      The examined esophagus was normal. Biopsies were obtained from the       proximal and distal esophagus with cold forceps for histology of       suspected eosinophilic esophagitis.      The exam was otherwise without abnormality. Impression:           - Normal examined duodenum.                       - 5 cm hiatal hernia.                       - Normal esophagus. Biopsied.                       - The examination was otherwise normal. Recommendation:       - Discharge patient to home (with escort).                       - Resume previous diet.                       - Continue present medications.                       - Await pathology results.                       - Return to my office in 6 weeks. Procedure Code(s):    --- Professional ---                       [phone], Esophagogastroduodenoscopy, flexible, transoral;                        with biopsy, single or multiple Diagnosis Code(s):    --- Professional ---                       K44.9, Diaphragmatic hernia without obstruction or                        gangrene                       R13.10, Dysphagia, unspecified CPT copyright 2017 American Medical [employer]. All rights reserved. The codes documented in this report are preliminary and upon coder review may  be revised to meet current compliance requirements. [name], MD [name] MD, MD 02/02/2018 8:57:03 AM This report has been signed electronically. Number of Addenda: 0 Note Initiated On: 02/02/2018 8:45 AM      [facility]

## 2018-02-03 ENCOUNTER — Encounter: Payer: Self-pay | Admitting: Gastroenterology

## 2018-02-04 ENCOUNTER — Encounter: Payer: PPO | Admitting: Internal Medicine

## 2018-02-05 LAB — SURGICAL PATHOLOGY

## 2018-02-07 ENCOUNTER — Encounter: Payer: Self-pay | Admitting: Gastroenterology

## 2018-02-15 ENCOUNTER — Other Ambulatory Visit: Payer: Self-pay | Admitting: Internal Medicine

## 2018-02-18 ENCOUNTER — Other Ambulatory Visit: Payer: Self-pay

## 2018-02-18 ENCOUNTER — Encounter: Payer: Self-pay | Admitting: *Deleted

## 2018-02-18 DIAGNOSIS — J329 Chronic sinusitis, unspecified: Secondary | ICD-10-CM | POA: Diagnosis not present

## 2018-02-18 DIAGNOSIS — J342 Deviated nasal septum: Secondary | ICD-10-CM | POA: Diagnosis not present

## 2018-02-18 NOTE — Discharge Instructions (Signed)
[facility] °[facility] °ENDOSCOPIC SINUS SURGERY °[facility] ° °What is Functional Endoscopic Sinus Surgery? ° The Surgery involves making the natural openings of the sinuses larger by removing the bony partitions that separate the sinuses from the nasal cavity.  The natural sinus lining is preserved as much as possible to allow the sinuses to resume normal function after the surgery.  In some patients nasal polyps (excessively swollen lining of the sinuses) may be removed to relieve obstruction of the sinus openings.  The surgery is performed through the nose using lighted scopes, which eliminates the need for incisions on the face.  A septoplasty is a different procedure which is sometimes performed with sinus surgery.  It involves straightening the boy partition that separates the two sides of your nose.  A crooked or deviated septum may need repair if is obstructing the sinuses or nasal airflow.  Turbinate reduction is also often performed during sinus surgery.  The turbinates are bony proturberances from the side walls of the nose which swell and can obstruct the nose in patients with sinus and allergy problems.  Their size can be surgically reduced to help relieve nasal obstruction. ° °What Can Sinus Surgery Do For Me? ° Sinus surgery can reduce the frequency of sinus infections requiring antibiotic treatment.  This can provide improvement in nasal congestion, post-nasal drainage, facial pressure and nasal obstruction.  Surgery will NOT prevent you from ever having an infection again, so it usually only for patients who get infections 4 or more times yearly requiring antibiotics, or for infections that do not clear with antibiotics.  It will not cure nasal allergies, so patients with allergies may still require medication to treat their allergies after surgery. Surgery may improve headaches related to sinusitis, however, some people will continue to  require medication to control sinus headaches related to allergies.  Surgery will do nothing for other forms of headache (migraine, tension or cluster). ° °What Are the Risks of Endoscopic Sinus Surgery? ° Current techniques allow surgery to be performed safely with little risk, however, there are rare complications that patients should be aware of.  Because the sinuses are located around the eyes, there is risk of eye injury, including blindness, though again, this would be quite rare. This is usually a result of bleeding behind the eye during surgery, which puts the vision oat risk, though there are treatments to protect the vision and prevent permanent disrupted by surgery causing a leak of the spinal fluid that surrounds the brain.  More serious complications would include bleeding inside the brain cavity or damage to the brain.  Again, all of these complications are uncommon, and spinal fluid leaks can be safely managed surgically if they occur.  The most common complication of sinus surgery is bleeding from the nose, which may require packing or cauterization of the nose.  Continued sinus have polyps may experience recurrence of the polyps requiring revision surgery.  Alterations of sense of smell or injury to the tear ducts are also rare complications.  ° °What is the Surgery Like, and what is the Recovery? ° The Surgery usually takes a couple of hours to perform, and is usually performed under a general anesthetic (completely asleep).  Patients are usually discharged home after a couple of hours.  Sometimes during surgery it is necessary to pack the nose to control bleeding, and the packing is left in place for 24 - 48 hours, and removed by your surgeon.    If a septoplasty was performed during the procedure, there is often a splint placed which must be removed after 5-7 days.   °Discomfort: Pain is usually mild to moderate, and can be controlled by prescription pain medication or acetaminophen (Tylenol).   Aspirin, Ibuprofen (Advil, Motrin), or Naprosyn (Aleve) should be avoided, as they can cause increased bleeding.  Most patients feel sinus pressure like they have a bad head cold for several days.  Sleeping with your head elevated can help reduce swelling and facial pressure, as can ice packs over the face.  A humidifier may be helpful to keep the mucous and blood from drying in the nose.  ° °Diet: There are no specific diet restrictions, however, you should generally start with clear liquids and a light diet of bland foods because the anesthetic can cause some nausea.  Advance your diet depending on how your stomach feels.  Taking your pain medication with food will often help reduce stomach upset which pain medications can cause. ° °Nasal Saline Irrigation: It is important to remove blood clots and dried mucous from the nose as it is healing.  This is done by having you irrigate the nose at least 3 - 4 times daily with a salt water solution.  We recommend using NeilMed Sinus Rinse (available at the drug store).  Fill the squeeze bottle with the solution, bend over a sink, and insert the tip of the squeeze bottle into the nose ½ of an inch.  Point the tip of the squeeze bottle towards the inside corner of the eye on the same side your irrigating.  Squeeze the bottle and gently irrigate the nose.  If you bend forward as you do this, most of the fluid will flow back out of the nose, instead of down your throat.   The solution should be warm, near body temperature, when you irrigate.   Each time you irrigate, you should use a full squeeze bottle.  ° °Note that if you are instructed to use Nasal Steroid Sprays at any time after your surgery, irrigate with saline BEFORE using the steroid spray, so you do not wash it all out of the nose. °Another product, Nasal Saline Gel (such as AYR Nasal Saline Gel) can be applied in each nostril 3 - 4 times daily to moisture the nose and reduce scabbing or crusting. ° °Bleeding:   Bloody drainage from the nose can be expected for several days, and patients are instructed to irrigate their nose frequently with salt water to help remove mucous and blood clots.  The drainage may be dark red or brown, though some fresh blood may be seen intermittently, especially after irrigation.  Do not blow you nose, as bleeding may occur. If you must sneeze, keep your mouth open to allow air to escape through your mouth. ° °If heavy bleeding occurs: Irrigate the nose with saline to rinse out clots, then spray the nose 3 - 4 times with Afrin Nasal Decongestant Spray.  The spray will constrict the blood vessels to slow bleeding.  Pinch the lower half of your nose shut to apply pressure, and lay down with your head elevated.  Ice packs over the nose may help as well. If bleeding persists despite these measures, you should notify your doctor.  Do not use the Afrin routinely to control nasal congestion after surgery, as it can result in worsening congestion and may affect healing.  ° ° ° °Activity: Return to work varies among patients. Most patients will be   out of work at least 5 - 7 days to recover.  Patient may return to work after they are off of narcotic pain medication, and feeling well enough to perform the functions of their job.  Patients must avoid heavy lifting (over 10 pounds) or strenuous physical for 2 weeks after surgery, so your employer may need to assign you to light duty, or keep you out of work longer if light duty is not possible.  NOTE: you should not drive, operate dangerous machinery, do any mentally demanding tasks or make any important legal or financial decisions while on narcotic pain medication and recovering from the general anesthetic.  °  °Call Your Doctor Immediately if You Have Any of the Following: °1. Bleeding that you cannot control with the above measures °2. Loss of vision, double vision, bulging of the eye or black eyes. °3. Fever over 101 degrees °4. Neck stiffness with  severe headache, fever, nausea and change in mental state. °You are always encourage to call anytime with concerns, however, please call with requests for pain medication refills during office hours. ° °Office Endoscopy: During follow-up visits your doctor will remove any packing or splints that may have been placed and evaluate and clean your sinuses endoscopically.  Topical anesthetic will be used to make this as comfortable as possible, though you may want to take your pain medication prior to the visit.  How often this will need to be done varies from patient to patient.  After complete recovery from the surgery, you may need follow-up endoscopy from time to time, particularly if there is concern of recurrent infection or nasal polyps. ° ° °General Anesthesia, Adult, Care After °These instructions provide you with information about caring for yourself after your procedure. Your health care provider may also give you more specific instructions. Your treatment has been planned according to current medical practices, but problems sometimes occur. Call your health care provider if you have any problems or questions after your procedure. °What can I expect after the procedure? °After the procedure, it is common to have: °· Vomiting. °· A sore throat. °· Mental slowness. ° °It is common to feel: °· Nauseous. °· Cold or shivery. °· Sleepy. °· Tired. °· Sore or achy, even in parts of your body where you did not have surgery. ° °Follow these instructions at home: °For at least 24 hours after the procedure: °· Do not: °? Participate in activities where you could fall or become injured. °? Drive. °? Use heavy machinery. °? Drink alcohol. °? Take sleeping pills or medicines that cause drowsiness. °? Make important decisions or sign legal documents. °? Take care of children on your own. °· Rest. °Eating and drinking °· If you vomit, drink water, juice, or soup when you can drink without vomiting. °· Drink enough fluid to  keep your urine clear or pale yellow. °· Make sure you have little or no nausea before eating solid foods. °· Follow the diet recommended by your health care provider. °General instructions °· Have a responsible adult stay with you until you are awake and alert. °· Return to your normal activities as told by your health care provider. Ask your health care provider what activities are safe for you. °· Take over-the-counter and prescription medicines only as told by your health care provider. °· If you smoke, do not smoke without supervision. °· Keep all follow-up visits as told by your health care provider. This is important. °Contact a health care provider if: °· You   continue to have nausea or vomiting at home, and medicines are not helpful. °· You cannot drink fluids or start eating again. °· You cannot urinate after 8-12 hours. °· You develop a skin rash. °· You have fever. °· You have increasing redness at the site of your procedure. °Get help right away if: °· You have difficulty breathing. °· You have chest pain. °· You have unexpected bleeding. °· You feel that you are having a life-threatening or urgent problem. °This information is not intended to replace advice given to you by your health care provider. Make sure you discuss any questions you have with your health care provider. °Document Released: 09/08/2000 Document Revised: 11/05/2015 Document Reviewed: 05/17/2015 °Elsevier Interactive Patient Education © 2018 [employer] ° °

## 2018-02-23 ENCOUNTER — Ambulatory Visit: Payer: PPO | Admitting: Anesthesiology

## 2018-02-23 ENCOUNTER — Ambulatory Visit
Admission: RE | Admit: 2018-02-23 | Discharge: 2018-02-23 | Disposition: A | Discharge status: Home / Self Care | Payer: PPO | Source: Ambulatory Visit | Attending: Otolaryngology | Admitting: Otolaryngology

## 2018-02-23 ENCOUNTER — Encounter
Admission: RE | Disposition: A | Discharge status: Home / Self Care | Payer: Self-pay | Source: Ambulatory Visit | Attending: Otolaryngology

## 2018-02-23 DIAGNOSIS — J321 Chronic frontal sinusitis: Secondary | ICD-10-CM | POA: Diagnosis not present

## 2018-02-23 DIAGNOSIS — J320 Chronic maxillary sinusitis: Secondary | ICD-10-CM | POA: Diagnosis not present

## 2018-02-23 DIAGNOSIS — E119 Type 2 diabetes mellitus without complications: Secondary | ICD-10-CM | POA: Insufficient documentation

## 2018-02-23 DIAGNOSIS — Z87891 Personal history of nicotine dependence: Secondary | ICD-10-CM | POA: Insufficient documentation

## 2018-02-23 DIAGNOSIS — J323 Chronic sphenoidal sinusitis: Secondary | ICD-10-CM | POA: Diagnosis not present

## 2018-02-23 DIAGNOSIS — J329 Chronic sinusitis, unspecified: Secondary | ICD-10-CM | POA: Diagnosis not present

## 2018-02-23 DIAGNOSIS — J322 Chronic ethmoidal sinusitis: Secondary | ICD-10-CM | POA: Diagnosis not present

## 2018-02-23 DIAGNOSIS — Z794 Long term (current) use of insulin: Secondary | ICD-10-CM | POA: Diagnosis not present

## 2018-02-23 DIAGNOSIS — J324 Chronic pansinusitis: Secondary | ICD-10-CM | POA: Diagnosis not present

## 2018-02-23 HISTORY — PX: IMAGE GUIDED SINUS SURGERY: SHX6570

## 2018-02-23 HISTORY — DX: Presence of external hearing-aid: Z97.4

## 2018-02-23 LAB — GLUCOSE, CAPILLARY
GLUCOSE-CAPILLARY: 193 mg/dL — AB (ref 70–99)
Glucose-Capillary: 174 mg/dL — ABNORMAL HIGH (ref 70–99)

## 2018-02-23 SURGERY — SINUS SURGERY, WITH IMAGING GUIDANCE
Anesthesia: General | Site: Nose | Laterality: Right | Wound class: Clean Contaminated

## 2018-02-23 MED ORDER — LACTATED RINGERS IV SOLN
1000.0000 mL | INTRAVENOUS | Status: DC
  Administered 2018-02-23: 1000 mL via INTRAVENOUS

## 2018-02-23 MED ORDER — MIDAZOLAM HCL 5 MG/5ML IJ SOLN
INTRAMUSCULAR | Status: DC | PRN
  Administered 2018-02-23: 2 mg via INTRAVENOUS

## 2018-02-23 MED ORDER — FENTANYL CITRATE (PF) 100 MCG/2ML IJ SOLN
INTRAMUSCULAR | Status: DC | PRN
  Administered 2018-02-23 (×2): 25 ug via INTRAVENOUS
  Administered 2018-02-23: 50 ug via INTRAVENOUS
  Administered 2018-02-23 (×2): 25 ug via INTRAVENOUS

## 2018-02-23 MED ORDER — OXYCODONE HCL 5 MG PO TABS
5.0000 mg | ORAL_TABLET | Freq: Once | ORAL | Status: AC | PRN
  Administered 2018-02-23: 5 mg via ORAL

## 2018-02-23 MED ORDER — GLYCOPYRROLATE 0.2 MG/ML IJ SOLN
INTRAMUSCULAR | Status: DC | PRN
  Administered 2018-02-23: 0.1 mg via INTRAVENOUS

## 2018-02-23 MED ORDER — LABETALOL HCL 5 MG/ML IV SOLN
INTRAVENOUS | Status: DC | PRN
  Administered 2018-02-23 (×2): 5 mg via INTRAVENOUS

## 2018-02-23 MED ORDER — OXYMETAZOLINE HCL 0.05 % NA SOLN
NASAL | Status: DC | PRN
  Administered 2018-02-23: 4 via TOPICAL

## 2018-02-23 MED ORDER — PROPOFOL 10 MG/ML IV BOLUS
INTRAVENOUS | Status: DC | PRN
  Administered 2018-02-23: 120 mg via INTRAVENOUS
  Administered 2018-02-23: 40 mg via INTRAVENOUS

## 2018-02-23 MED ORDER — DOXYCYCLINE HYCLATE 100 MG PO CAPS: ORAL_CAPSULE | ORAL | 0 refills | Status: DC

## 2018-02-23 MED ORDER — HYDROCODONE-ACETAMINOPHEN 5-325 MG PO TABS: 1.0000 | ORAL_TABLET | Freq: Four times a day (QID) | ORAL | 0 refills | Status: DC | PRN

## 2018-02-23 MED ORDER — OXYCODONE HCL 5 MG/5ML PO SOLN: 5.0000 mg | Freq: Once | ORAL | Status: AC | PRN

## 2018-02-23 MED ORDER — LIDOCAINE HCL (CARDIAC) PF 100 MG/5ML IV SOSY
PREFILLED_SYRINGE | INTRAVENOUS | Status: DC | PRN
  Administered 2018-02-23: 40 mg via INTRAVENOUS

## 2018-02-23 MED ORDER — FENTANYL CITRATE (PF) 100 MCG/2ML IJ SOLN
25.0000 ug | INTRAMUSCULAR | Status: DC | PRN
  Administered 2018-02-23 (×4): 25 ug via INTRAVENOUS

## 2018-02-23 MED ORDER — DEXAMETHASONE SODIUM PHOSPHATE 4 MG/ML IJ SOLN
INTRAMUSCULAR | Status: DC | PRN
  Administered 2018-02-23: 10 mg via INTRAVENOUS

## 2018-02-23 MED ORDER — LIDOCAINE-EPINEPHRINE 1 %-1:100000 IJ SOLN
INTRAMUSCULAR | Status: DC | PRN
  Administered 2018-02-23: 8 mL

## 2018-02-23 MED ORDER — ROCURONIUM BROMIDE 100 MG/10ML IV SOLN
INTRAVENOUS | Status: DC | PRN
  Administered 2018-02-23: 5 mg via INTRAVENOUS

## 2018-02-23 SURGICAL SUPPLY — 23 items
BATTERY INSTRU NAVIGATION (MISCELLANEOUS) ×12 IMPLANT
CANISTER SUCT 1200ML W/VALVE (MISCELLANEOUS) ×3 IMPLANT
COAG SUCT 10F 3.5MM HAND CTRL (MISCELLANEOUS) ×3 IMPLANT
DRAPE HEAD BAR (DRAPES) ×3 IMPLANT
DRESSING NASL FOAM PST OP SINU (MISCELLANEOUS) IMPLANT
DRSG NASAL FOAM POST OP SINU (MISCELLANEOUS)
ELECT REM PT RETURN 9FT ADLT (ELECTROSURGICAL) ×3
ELECTRODE REM PT RTRN 9FT ADLT (ELECTROSURGICAL) ×2 IMPLANT
GLOVE BIO SURGEON STRL SZ7.5 (GLOVE) ×9 IMPLANT
IV NS 500ML (IV SOLUTION) ×1
IV NS 500ML BAXH (IV SOLUTION) ×2 IMPLANT
KIT TURNOVER KIT A (KITS) ×3 IMPLANT
NS IRRIG 500ML POUR BTL (IV SOLUTION) ×3 IMPLANT
PACK DRAPE NASAL/ENT (PACKS) ×3 IMPLANT
PACKING NASAL EPIS 4X2.4 XEROG (MISCELLANEOUS) ×6 IMPLANT
PATTIES SURGICAL .5 X3 (DISPOSABLE) ×3 IMPLANT
SHAVER DIEGO BLD STD TYPE A (BLADE) ×3 IMPLANT
SOL ANTI-FOG 6CC FOG-OUT (MISCELLANEOUS) ×2 IMPLANT
SOL FOG-OUT ANTI-FOG 6CC (MISCELLANEOUS) ×1
SYR 10ML LL (SYRINGE) ×3 IMPLANT
TRACKER CRANIALMASK (MASK) ×3 IMPLANT
TUBING DECLOG MULTIDEBRIDER (TUBING) ×3 IMPLANT
WATER STERILE IRR 250ML POUR (IV SOLUTION) ×3 IMPLANT

## 2018-02-23 NOTE — Anesthesia Preprocedure Evaluation (Signed)
Anesthesia Evaluation  Patient identified by MRN, date of birth, ID band Patient awake    Reviewed: Allergy & Precautions, H&P , NPO status , Patient's Chart, lab work & pertinent test results  Airway Mallampati: II  TM Distance: >3 FB Neck ROM: full    Dental no notable dental hx.    Pulmonary former smoker,    Pulmonary exam normal breath sounds clear to auscultation       Cardiovascular negative cardio ROS Normal cardiovascular exam     Neuro/Psych    GI/Hepatic Neg liver ROS, Medicated,  Endo/Other  diabetes, Well Controlled, Type 2, Insulin Dependent  Renal/GU negative Renal ROS     Musculoskeletal   Abdominal   Peds  Hematology negative hematology ROS (+)   Anesthesia Other Findings   Reproductive/Obstetrics negative OB ROS                             Anesthesia Physical Anesthesia Plan  ASA: II  Anesthesia Plan: General ETT   Post-op Pain Management:    Induction:   PONV Risk Score and Plan:   Airway Management Planned:   Additional Equipment:   Intra-op Plan:   Post-operative Plan:   Informed Consent: I have reviewed the patients History and Physical, chart, labs and discussed the procedure including the risks, benefits and alternatives for the proposed anesthesia with the patient or authorized representative who has indicated his/her understanding and acceptance.     Plan Discussed with:   Anesthesia Plan Comments:         Anesthesia Quick Evaluation

## 2018-02-23 NOTE — Anesthesia Procedure Notes (Signed)
Procedure Name: Intubation Date/Time: 02/23/2018 9:37 AM Performed by: Mayme Genta, CRNA Pre-anesthesia Checklist: Patient identified, Emergency Drugs available, Suction available, Patient being monitored and Timeout performed Patient Re-evaluated:Patient Re-evaluated prior to induction Oxygen Delivery Method: Circle system utilized Preoxygenation: Pre-oxygenation with 100% oxygen Induction Type: IV induction Ventilation: Mask ventilation without difficulty Laryngoscope Size: Miller and 3 Grade View: Grade I Tube type: Oral Rae Tube size: 7.5 mm Number of attempts: 2 Airway Equipment and Method: Stylet Placement Confirmation: ETT inserted through vocal cords under direct vision,  positive ETCO2 and breath sounds checked- equal and bilateral Tube secured with: Tape Dental Injury: Teeth and Oropharynx as per pre-operative assessment

## 2018-02-23 NOTE — Op Note (Signed)
02/23/2018  11:33 AM    Alexis Sharp  [id number]   Pre-Op Diagnosis:  CHRONIC SINUSITIS  Post-op Diagnosis: CHRONIC SINUSITIS  Procedure:  1)  Image Guided Sinus Surgery,   2)  Bilateral Endoscopic Maxillary Antrostomy with Tissue Removal   3)  Right Frontal Sinusotomy   4)  Right Total Ethmoidectomy   5)  Right Sphenoidotomy    Surgeon:  Riley Nearing  Anesthesia:  General endotracheal  EBL:  74JO  Complications:  None  Findings: Purulence in the maxillary sinuses with mucoid secretions in the right frontal, ethmoid and sphenoid sinuses. Mucosal thickening.  Procedure: After the patient was identified in holding and the benefits of the procedure were reviewed as well as the consent and risks, the patient was taken to the operating room and with the patient in a comfortable supine position,  general orotracheal anesthesia was induced without difficulty.  A proper time-out was performed.  The [employer]ryker image guidance system was set up and calibrated in the normal fashion and felt to be acceptable.  Next 1% Xylocaine with 1:100,000 epinephrine was infiltrated into the  anterior middle turbinates and uncinate regions bilaterally.  Several minutes were allowed for this to take effect.  Cottoniod pledgets soaked in Afrin were placed into both nasal cavities and left while the patient was prepped and draped in the standard fashion. The image guided suction was calibrated and used to inspect known points in the nasal cavity to assess accuracy of the image guided system. Accuracy was felt to be excellent.   The left middle turbinate was medialized and the uncinate process then resected with through-cutting forceps as well as the microdebrider. In this fashion the uncinate was completely removed along with soft tissue and bone of the medial wall of the maxillary sinus to create a large patent maxillary antrostomy. The left maxillary sinus was suctioned to clear purulent secretions. Cultures  were taken.  Next the right middle turbinate was medialized and the uncinate process then resected with through-cutting forceps as well as the microdebrider. In this fashion the uncinate was completely removed along with soft tissue and bone of the medial wall of the maxillary sinus to create a large patent maxillary antrostomy. The right maxillary sinus was suctioned to clear purulent secretions. Cultures were taken from this side as well.   The right anterior ethmoid sinuses were dissected beginning inferomedially, entering the ethmoid bulla. Thru cut forceps were used to open the anterior ethmoids. The microdebrider was used as needed to trim loose mucosal edges.  Next the basal lamella was entered and, working back in a sequential fashion through the ethmoid air cells, the ethmoid sinuses were dissected to the posterior ethmoid sinuses, utilizing the image guided suction and the whole time to reassess the anatomy frequently. Thru cutting forceps were used for this dissection. Care was taken to avoid injury to the lamina papyracea laterally and the skull base superiorly.   Dissection proceeded anteriorly and superiorly into the left frontal recess which was dissected utilizing a 30 and then a 70 scope and frontal curved instruments. The curved image guided suction was used during this dissection to frequently reassess the anatomy on the CT scan. The frontal recess was dissected until a suction could be passed up into the region of the frontal sinus.   Next the scope was passed medial to the middle turbinate and the right sphenoid recess inspected. With the assistance of the image guided system, the sphenoid sinus was carefully entered through some  thin bone at the anterior face of the spenoid, medial to the superior turbinate, and widely opened with through-cutting sphenoid punch forceps. Mucus was suctioned from the sphenoid sinus.  The nose was suctioned and inspected. The maxillary sinuses were  irrigated with saline and suctioned. They were inspected with the 0 degree scope and noted to have some polypoid mucosal thickening, but no residual purulence and no evidence of fungal debris. Xerogel absorbable sinus packing was then placed in the ethmoid cavities bilaterally.   The patient was then returned to the anesthesiologist for awakening and taken to recovery room in good condition postoperatively.  Disposition:   PACU and d/c home  Plan: Ice, elevation, narcotic analgesia and prophylactic antibiotics. Begin sinus irrigations with saline tomrrow, irrigating 3-4 times daily. Return to the office in 7 days.  Return to work in 7-10 days, no strenuous activities for two weeks.   Riley Nearing 02/23/2018 11:33 AM

## 2018-02-23 NOTE — H&P (Signed)
History and physical reviewed and will be scanned in later. No change in medical status reported by the patient or family, appears stable for surgery. All questions regarding the procedure answered, and patient (or family if a child) expressed understanding of the procedure. ? ?Alexis Sharp ?@TODAY@ ?

## 2018-02-23 NOTE — Anesthesia Postprocedure Evaluation (Signed)
Anesthesia Post Note  Patient: Alexis Sharp  Procedure(s) Performed: IMAGE GUIDED SINUS SURGERY Right ethmoidectomy with frontal exploration bilteral maxillary antrostomies Right sphenoidectomy (Bilateral Nose)  Patient location during evaluation: PACU Anesthesia Type: General Level of consciousness: awake and alert Pain management: pain level controlled Vital Signs Assessment: post-procedure vital signs reviewed and stable Respiratory status: spontaneous breathing Cardiovascular status: blood pressure returned to baseline Anesthetic complications: no    Jaci Standard, III,  Verna Desrocher D

## 2018-02-23 NOTE — Transfer of Care (Signed)
Immediate Anesthesia Transfer of Care Note  Patient: Alexis Sharp  Procedure(s) Performed: IMAGE GUIDED SINUS SURGERY Right ethmoidectomy with frontal exploration bilteral maxillary antrostomies Right sphenoidectomy (Bilateral Nose)  Patient Location: PACU  Anesthesia Type: General ETT  Level of Consciousness: awake, alert  and patient cooperative  Airway and Oxygen Therapy: Patient Spontanous Breathing and Patient connected to supplemental oxygen  Post-op Assessment: Post-op Vital signs reviewed, Patient's Cardiovascular Status Stable, Respiratory Function Stable, Patent Airway and No signs of Nausea or vomiting  Post-op Vital Signs: Reviewed and stable  Complications: No apparent anesthesia complications

## 2018-02-24 ENCOUNTER — Encounter: Payer: Self-pay | Admitting: Otolaryngology

## 2018-02-25 LAB — SURGICAL PATHOLOGY

## 2018-02-28 LAB — AEROBIC/ANAEROBIC CULTURE W GRAM STAIN (SURGICAL/DEEP WOUND): Culture: NORMAL

## 2018-02-28 LAB — AEROBIC/ANAEROBIC CULTURE (SURGICAL/DEEP WOUND): CULTURE: NORMAL

## 2018-03-03 DIAGNOSIS — J329 Chronic sinusitis, unspecified: Secondary | ICD-10-CM | POA: Diagnosis not present

## 2018-03-11 ENCOUNTER — Ambulatory Visit (INDEPENDENT_AMBULATORY_CARE_PROVIDER_SITE_OTHER): Payer: PPO | Admitting: Gastroenterology

## 2018-03-11 ENCOUNTER — Encounter: Payer: Self-pay | Admitting: Gastroenterology

## 2018-03-11 VITALS — BP 120/76 | HR 62 | Ht 71.0 in | Wt 222.6 lb

## 2018-03-11 DIAGNOSIS — R1310 Dysphagia, unspecified: Secondary | ICD-10-CM

## 2018-03-11 DIAGNOSIS — K219 Gastro-esophageal reflux disease without esophagitis: Secondary | ICD-10-CM | POA: Diagnosis not present

## 2018-03-11 MED ORDER — OMEPRAZOLE 20 MG PO CPDR: 20.0000 mg | DELAYED_RELEASE_CAPSULE | Freq: Every day | ORAL | 3 refills | Status: DC

## 2018-03-11 NOTE — Progress Notes (Signed)
[name] MD, MRCP(U.K) [address]  [place]  [place], [place] [address]  Main: [phone]  Fax: [phone]   Primary Care Physician: McLean-Scocuzza, Nino Glow, MD  Primary Gastroenterologist:  Dr. [name]   Chief Complaint  Patient presents with  . Follow-up    Dysphagia    HPI: Alexis Sharp is a 72 y.o. male    Summary of history :  He was initially referred and seen on 01/27/18 for dysphagia. It initially began per his history after he saw ENT and had a spray in his mouth , had lunch and felt it was stuck in his throat ,went to ER, given IM glucagon , felt better and d/c  .He says he saw ENT for recurrence sinus infections.   Similar episode 5-10 years back, similar episode. Did not need urgent endoscopy. Had an endoscopy later which showed "acid reflux".    Interval history   01/27/2018-  03/11/2018  02/02/18: EGD: 5 cm hiatal hernia , normal esophagus, biopsies were taken that showed features of reflux.   On Prilosec once a day 40 mg , before breakfast- no symptoms.     Current Outpatient Medications  Medication Sig Dispense Refill  . bimatoprost (LUMIGAN) 0.01 % SOLN Place 1 drop into both eyes at bedtime.     . Continuous Blood Gluc Sensor (FREESTYLE LIBRE 14 DAY SENSOR) MISC USE TO CHECK BLOOD GLUCOSE FOR 14 DAYS 2 each 6  . doxycycline (VIBRAMYCIN) 100 MG capsule 100 mg PO BID x 10 days 20 capsule 0  . glipiZIDE (GLUCOTROL) 5 MG tablet TAKE 1 TABLET BY MOUTH TWICE DAILY BEFORE A MEAL 180 tablet 3  . glucose blood (ONE TOUCH ULTRA TEST) test strip USE ONE STRIP TO CHECK GLUCOSE ONCE DAILY 100 each 3  . HYDROcodone-acetaminophen (NORCO/VICODIN) 5-325 MG tablet Take 1-2 tablets by mouth every 6 (six) hours as needed for moderate pain. 40 tablet 0  . insulin aspart (NOVOLOG FLEXPEN) 100 UNIT/ML FlexPen <70 0 units, 70-130 0 units,  131-180 2 units, 181-240 4 units, 241-300 6 units, 301-350 8 units, 351-400 10 units, >400 12 units and call doctor 15  mL 11  . insulin glargine (LANTUS) 100 UNIT/ML injection Inject into the skin daily.    . Insulin Pen Needle (PEN NEEDLES) 31G X 6 MM MISC 1 Device by Does not apply route 3 (three) times daily as needed. 300 each 3  . Insulin Pen Needle (PEN NEEDLES) 32G X 4 MM MISC 1 Units by Does not apply route daily. Use 1 pen needle daily with Lantus 100 each 3  . ipratropium (ATROVENT) 0.06 % nasal spray Place 2 sprays into both nostrils 4 (four) times daily. 15 mL 12  . metFORMIN (GLUCOPHAGE) 500 MG tablet Take 1 tablet (500 mg total) by mouth 3 (three) times daily. (Patient taking differently: Take 500 mg by mouth 2 (two) times daily with a meal. ) 270 tablet 3  . Multiple Vitamin (MULTIVITAMIN) capsule Take 1 capsule by mouth daily.      Marland Kitchen omeprazole (PRILOSEC) 40 MG capsule Take 1 capsule (40 mg total) by mouth daily. 90 capsule 3  . ONETOUCH DELICA LANCETS FINE MISC Use to check blood sugar once a day. Dx Code E11.9 100 each 3  . timolol (TIMOPTIC) 0.5 % ophthalmic solution Place 1 drop into both eyes daily.      No current facility-administered medications for this visit.     Allergies as of 03/11/2018 - Review Complete 03/11/2018  Allergen Reaction Noted  . Codeine  04/19/2007  . Pioglitazone  04/19/2007  . Sitagliptin phosphate  04/19/2007  . Atorvastatin Other (See Comments) 11/14/2015  . Crestor [rosuvastatin]  02/24/2012    ROS:  General: Negative for anorexia, weight loss, fever, chills, fatigue, weakness. ENT: Negative for hoarseness, difficulty swallowing , nasal congestion. CV: Negative for chest pain, angina, palpitations, dyspnea on exertion, peripheral edema.  Respiratory: Negative for dyspnea at rest, dyspnea on exertion, cough, sputum, wheezing.  GI: See history of present illness. GU:  Negative for dysuria, hematuria, urinary incontinence, urinary frequency, nocturnal urination.  Endo: Negative for unusual weight change.    Physical Examination:   BP 120/76   Pulse 62    Ht 5\' 11"  (1.803 m)   Wt 222 lb 9.6 oz (101 kg)   BMI 31.05 kg/m   General: Well-nourished, well-developed in no acute distress.  Eyes: No icterus. Conjunctivae pink. Mouth: Oropharyngeal mucosa moist and pink , no lesions erythema or exudate. Lungs: Clear to auscultation bilaterally. Non-labored. Heart: Regular rate and rhythm, no murmurs rubs or gallops.  Abdomen: Bowel sounds are normal, nontender, nondistended, no hepatosplenomegaly or masses, no abdominal bruits or hernia , no rebound or guarding.   Extremities: No lower extremity edema. No clubbing or deformities. Neuro: Alert and oriented x 3.  Grossly intact. Skin: Warm and dry, no jaundice.   Psych: Alert and cooperative, normal mood and affect.   Imaging Studies: No results found.  Assessment and Plan:   Alexis Sharp is a 72 y.o. y/o male here to follow up for dysphagia. EGD showed no obstruction or EOE. Likely secondary to acid reflux. GERD likely severe due to large hiatal hernia.   Plan  GERD : Counseled on life style changes, suggest to use PPI first thing in the morning on empty stomach and eat 30 minutes after. Advised on the use of a wedge pillow at night , avoid meals for 2 hours prior to bed time. Weight loss .Discussed the risks and benefits of long term PPI use including but not limited to bone loss, chronic kidney disease, infections , low magnesium . Aim to use at the lowest dose for the shortest period of time .  Decrease prilosec to 20 mg a day - try to go off it in 2 months and if symptoms return may need to stay on PPI long term as he has a large hiatal hernia which is likely the cause of his symptoms.     Dr [name]  MD,MRCP [facility]) Follow up PRN

## 2018-03-11 NOTE — Patient Instructions (Signed)

## 2018-03-18 ENCOUNTER — Encounter: Payer: Self-pay | Admitting: Internal Medicine

## 2018-03-18 ENCOUNTER — Ambulatory Visit (INDEPENDENT_AMBULATORY_CARE_PROVIDER_SITE_OTHER): Payer: PPO | Admitting: Internal Medicine

## 2018-03-18 VITALS — BP 140/90 | HR 68 | Temp 97.8°F | Resp 16 | Ht 71.0 in | Wt 220.0 lb

## 2018-03-18 DIAGNOSIS — E119 Type 2 diabetes mellitus without complications: Secondary | ICD-10-CM | POA: Diagnosis not present

## 2018-03-18 DIAGNOSIS — Z794 Long term (current) use of insulin: Secondary | ICD-10-CM | POA: Diagnosis not present

## 2018-03-18 DIAGNOSIS — J329 Chronic sinusitis, unspecified: Secondary | ICD-10-CM | POA: Diagnosis not present

## 2018-03-18 DIAGNOSIS — Z23 Encounter for immunization: Secondary | ICD-10-CM

## 2018-03-18 DIAGNOSIS — R809 Proteinuria, unspecified: Secondary | ICD-10-CM | POA: Diagnosis not present

## 2018-03-18 DIAGNOSIS — R030 Elevated blood-pressure reading, without diagnosis of hypertension: Secondary | ICD-10-CM

## 2018-03-18 NOTE — Progress Notes (Addendum)
Chief Complaint  Patient presents with  . Follow-up  . Diabetes   F/u  1. 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 Elevated BP today normally BP 1teens-130s 4. Reviewed labs +proteinuria disc ACEI/ARB pt to think about will repeat urine in 3 months   Review of Systems  Constitutional: Negative for weight loss.  HENT: Negative for hearing loss.   Eyes: Negative for blurred vision.  Respiratory: Negative for shortness of breath.   Cardiovascular: Negative for chest pain.  Gastrointestinal: Negative for abdominal pain.  Musculoskeletal: Negative for falls.  Skin: Negative for rash.  Neurological: Negative for headaches.  Psychiatric/Behavioral: Negative for depression.   Past Medical History:  Diagnosis Date  . Allergy    dust, grass   . Diabetes mellitus   . Glaucoma    [facility] q 6 months   . Hyperlipidemia   . Migraines    rare   . Reflux esophagitis   . Wears hearing aid in both ears    Past Surgical History:  Procedure Laterality Date  . CATARACT EXTRACTION, BILATERAL    . COLONOSCOPY WITH PROPOFOL N/A 09/11/2017   Procedure: COLONOSCOPY WITH PROPOFOL;  Surgeon: Manya Silvas, MD;  Location[facility] ENDOSCOPY;  Service: Endoscopy;  Laterality: N/A;  . ESOPHAGOGASTRODUODENOSCOPY (EGD) WITH PROPOFOL N/A 02/02/2018   Procedure: ESOPHAGOGASTRODUODENOSCOPY (EGD) WITH PROPOFOL;  Surgeon: [name], MD;  Location: [facility] ENDOSCOPY;  Service: Gastroenterology;  Laterality: N/A;  . EYE SURGERY    . HERNIA REPAIR    . IMAGE GUIDED SINUS SURGERY Bilateral 02/23/2018   Procedure: IMAGE GUIDED SINUS SURGERY Right ethmoidectomy with frontal exploration bilteral maxillary antrostomies Right sphenoidectomy;  Surgeon:  Clyde Canterbury, MD;  Location: [facility];  Service: ENT;  Laterality: Bilateral;  NEED STRYKER DISK GAVE DISK TO CECE 9-9  . UMBILICAL HERNIA REPAIR  2002   Family History  Problem Relation Age of Onset  . Diabetes Mother   . Diabetes Sister   . Heart disease Brother   . Diabetes Brother   . Heart disease Brother   . Diabetes Brother   . Diabetes Brother   . Diabetes Sister   . Diabetes Sister    Social History   Socioeconomic History  . Marital status: Married    Spouse name: Not on file  . Number of children: 2  . Years of education: Not on file  . Highest education level: Not on file  Occupational History  . Occupation: semi retired Museum/gallery curator asst,  does Engineer, manufacturing: SELF EMPLOYED  Social Needs  . Financial resource strain: Not on file  . Food insecurity:    Worry: Not on file    Inability: Not on file  . Transportation needs:    Medical: Not on file    Non-medical: Not on file  Tobacco Use  . Smoking status: Former Research scientist (life sciences)  . Smokeless tobacco: Never Used  . Tobacco comment: smoked some as teenager  Substance and Sexual Activity  . Alcohol use: No    Alcohol/week: 0.0 standard drinks  . Drug use: No  . Sexual activity: Yes  Lifestyle  . Physical activity:    Days per week: Not on  file    Minutes per session: Not on file  . Stress: Not on file  Relationships  . Social connections:    Talks on phone: Not on file    Gets together: Not on file    Attends religious service: Not on file    Active member of club or organization: Not on file    Attends meetings of clubs or organizations: Not on file    Relationship status: Not on file  . Intimate partner violence:    Fear of current or ex partner: Not on file    Emotionally abused: Not on file    Physically abused: Not on file    Forced sexual activity: Not on file  Other Topics Concern  . Not on file  Social History Narrative   No living will   No health care POA---okay  with wife making decisions. Daughter Joelene Millin would be alternate   Would accept resuscitation   Not sure about tube feeds   Current Meds  Medication Sig  . bimatoprost (LUMIGAN) 0.01 % SOLN Place 1 drop into both eyes at bedtime.   . Continuous Blood Gluc Sensor (FREESTYLE LIBRE 14 DAY SENSOR) MISC USE TO CHECK BLOOD GLUCOSE FOR 14 DAYS  . doxycycline (VIBRAMYCIN) 100 MG capsule 100 mg PO BID x 10 days  . glipiZIDE (GLUCOTROL) 5 MG tablet TAKE 1 TABLET BY MOUTH TWICE DAILY BEFORE A MEAL  . glucose blood (ONE TOUCH ULTRA TEST) test strip USE ONE STRIP TO CHECK GLUCOSE ONCE DAILY  . HYDROcodone-acetaminophen (NORCO/VICODIN) 5-325 MG tablet Take 1-2 tablets by mouth every 6 (six) hours as needed for moderate pain.  Marland Kitchen insulin aspart (NOVOLOG FLEXPEN) 100 UNIT/ML FlexPen <70 0 units, 70-130 0 units,  131-180 2 units, 181-240 4 units, 241-300 6 units, 301-350 8 units, 351-400 10 units, >400 12 units and call doctor  . insulin glargine (LANTUS) 100 UNIT/ML injection Inject into the skin daily.  . Insulin Pen Needle (PEN NEEDLES) 31G X 6 MM MISC 1 Device by Does not apply route 3 (three) times daily as needed.  . Insulin Pen Needle (PEN NEEDLES) 32G X 4 MM MISC 1 Units by Does not apply route daily. Use 1 pen needle daily with Lantus  . ipratropium (ATROVENT) 0.06 % nasal spray Place 2 sprays into both nostrils 4 (four) times daily.  . metFORMIN (GLUCOPHAGE) 500 MG tablet Take 1 tablet (500 mg total) by mouth 3 (three) times daily. (Patient taking differently: Take 500 mg by mouth 2 (two) times daily with a meal. )  . Multiple Vitamin (MULTIVITAMIN) capsule Take 1 capsule by mouth daily.    Marland Kitchen omeprazole (PRILOSEC) 20 MG capsule Take 1 capsule (20 mg total) by mouth daily.  Glory Rosebush DELICA LANCETS FINE MISC Use to check blood sugar once a day. Dx Code E11.9  . timolol (TIMOPTIC) 0.5 % ophthalmic solution Place 1 drop into both eyes daily.    Allergies  Allergen Reactions  . Codeine   .  Pioglitazone     REACTION: achy  . Sitagliptin Phosphate     REACTION: muscle aches  . Atorvastatin Other (See Comments)    myalgia  . Crestor [Rosuvastatin]     Muscle aching   Recent Results (from the past 2160 hour(s))  Measles/Mumps/Rubella Immunity     Status: None   Collection Time: 01/08/18  8:54 AM  Result Value Ref Range   Rubeola IgG >300.00 AU/mL    Comment: AU/mL  Interpretation -----            -------------- <25.00           Negative 25.00-29.99      Equivocal >29.99           Positive . A positive result indicates that the patient has antibody to measles virus. It does not differentiate  between an active or past infection. The clinical  diagnosis must be interpreted in conjunction with  clinical signs and symptoms of the patient.    Mumps IgG 92.30 AU/mL    Comment:  AU/mL           Interpretation -------         ---------------- <9.00             Negative 9.00-10.99        Equivocal >10.99            Positive A positive result indicates that the patient has  antibody to mumps virus. It does not differentiate between an  active or past infection. The clinical diagnosis must be interpreted in conjunction with clinical signs and symptoms of the patient. .    Rubella 5.35 index    Comment:     Index            Interpretation     -----            --------------       <0.90            Not consistent with Immunity     0.90-0.99        Equivocal     > or = 1.00      Consistent with Immunity  . The presence of rubella IgG antibody suggests  immunization or past or current infection with rubella virus.   PSA, Medicare     Status: None   Collection Time: 01/08/18  8:54 AM  Result Value Ref Range   PSA 0.53 0.10 - 4.00 ng/ml    Comment: Test performed using Access Hybritech PSA Assay, a parmagnetic partical, chemiluminecent immunoassay.  VITAMIN D 25 Hydroxy (Vit-D Deficiency, Fractures)     Status: None   Collection Time: 01/08/18  8:54 AM   Result Value Ref Range   VITD 35.27 30.00 - 100.00 ng/mL  Hemoglobin A1c     Status: Abnormal   Collection Time: 01/08/18  8:54 AM  Result Value Ref Range   Hgb A1c MFr Bld 7.1 (H) 4.6 - 6.5 %    Comment: Glycemic Control Guidelines for People with Diabetes:Non Diabetic:  <6%Goal of Therapy: <7%Additional Action Suggested:  >8%   TSH     Status: None   Collection Time: 01/08/18  8:54 AM  Result Value Ref Range   TSH 1.63 0.35 - 4.50 uIU/mL  Urinalysis, Routine w reflex microscopic     Status: None   Collection Time: 01/08/18  8:54 AM  Result Value Ref Range   Specific Gravity, UA 1.018 1.005 - 1.030   pH, UA 6.0 5.0 - 7.5   Color, UA Yellow Yellow   Appearance Ur Clear Clear   Leukocytes, UA Negative Negative   Protein, UA Negative Negative/Trace   Glucose, UA Negative Negative   Ketones, UA Negative Negative   RBC, UA Negative Negative   Bilirubin, UA Negative Negative   Urobilinogen, Ur 0.2 0.2 - 1.0 mg/dL   Nitrite, UA Negative Negative   Microscopic Examination Comment     Comment: Microscopic not indicated and not  performed.  Lipid panel     Status: Abnormal   Collection Time: 01/08/18  8:54 AM  Result Value Ref Range   Cholesterol 161 0 - 200 mg/dL    Comment: ATP III Classification       Desirable:  < 200 mg/dL               Borderline High:  200 - 239 mg/dL          High:  > = 240 mg/dL   Triglycerides 85.0 0.0 - 149.0 mg/dL    Comment: Normal:  <150 mg/dLBorderline High:  150 - 199 mg/dL   HDL 34.30 (L) >39.00 mg/dL   VLDL 17.0 0.0 - 40.0 mg/dL   LDL Cholesterol 110 (H) 0 - 99 mg/dL   Total CHOL/HDL Ratio 5     Comment:                Men          Women1/2 Average Risk     3.4          3.3Average Risk          5.0          4.42X Average Risk          9.6          7.13X Average Risk          15.0          11.0                       NonHDL 126.56     Comment: NOTE:  Non-HDL goal should be 30 mg/dL higher than patient's LDL goal (i.e. LDL goal of < 70 mg/dL, would  have non-HDL goal of < 100 mg/dL)  CBC with Differential/Platelet     Status: None   Collection Time: 01/08/18  8:54 AM  Result Value Ref Range   WBC 4.8 4.0 - 10.5 K/uL   RBC 4.57 4.22 - 5.81 Mil/uL   Hemoglobin 15.1 13.0 - 17.0 g/dL   HCT 43.4 39.0 - 52.0 %   MCV 95.0 78.0 - 100.0 fl   MCHC 34.8 30.0 - 36.0 g/dL   RDW 13.4 11.5 - 15.5 %   Platelets 200.0 150.0 - 400.0 K/uL   Neutrophils Relative % 54.8 43.0 - 77.0 %   Lymphocytes Relative 30.5 12.0 - 46.0 %   Monocytes Relative 10.6 3.0 - 12.0 %   Eosinophils Relative 3.0 0.0 - 5.0 %   Basophils Relative 1.1 0.0 - 3.0 %   Neutro Abs 2.7 1.4 - 7.7 K/uL   Lymphs Abs 1.5 0.7 - 4.0 K/uL   Monocytes Absolute 0.5 0.1 - 1.0 K/uL   Eosinophils Absolute 0.1 0.0 - 0.7 K/uL   Basophils Absolute 0.1 0.0 - 0.1 K/uL  Comprehensive metabolic panel     Status: Abnormal   Collection Time: 01/08/18  8:54 AM  Result Value Ref Range   Sodium 139 135 - 145 mEq/L   Potassium 4.7 3.5 - 5.1 mEq/L   Chloride 103 96 - 112 mEq/L   CO2 28 19 - 32 mEq/L   Glucose, Bld 168 (H) 70 - 99 mg/dL   BUN 15 6 - 23 mg/dL   Creatinine, Ser 1.15 0.40 - 1.50 mg/dL   Total Bilirubin 0.6 0.2 - 1.2 mg/dL   Alkaline Phosphatase 46 39 - 117 U/L   AST 12 0 - 37 U/L   ALT 15 0 -  53 U/L   Total Protein 6.9 6.0 - 8.3 g/dL   Albumin 4.5 3.5 - 5.2 g/dL   Calcium 9.0 8.4 - 10.5 mg/dL   GFR 66.77 >60.00 mL/min  Urine Microalbumin w/creat. ratio     Status: Abnormal   Collection Time: 01/08/18  8:56 AM  Result Value Ref Range   Creatinine, Urine 149.4 Not Estab. mg/dL   Microalbumin, Urine 53.2 Not Estab. ug/mL   Microalb/Creat Ratio 35.6 (H) 0.0 - 30.0 mg/g creat    Comment:                      Normal:                0.0 -  30.0                      Albuminuria:          31.0 - 300.0                      Clinical albuminuria:       >300.0   Glucose, capillary     Status: Abnormal   Collection Time: 02/02/18  8:00 AM  Result Value Ref Range   Glucose-Capillary 163  (H) 70 - 99 mg/dL  Surgical pathology     Status: None   Collection Time: 02/02/18  8:53 AM  Result Value Ref Range   SURGICAL PATHOLOGY      Surgical Pathology CASE: [id number] PATIENT: [facility] Surgical Pathology Report     SPECIMEN SUBMITTED: A. Esophagus, r/o eoe; cbx  CLINICAL HISTORY: None provided  PRE-OPERATIVE DIAGNOSIS: Dysphagia R13.10  POST-OPERATIVE DIAGNOSIS: Hiatal hernia - otherwise normal study     DIAGNOSIS: A. ESOPHAGUS; COLD BIOPSY: - SQUAMOUS MUCOSA WITH CHANGES CONSISTENT WITH REFLUX IN A MINORITY OF THE BIOPSY FRAGMENTS. - GLYCOGEN ACANTHOSIS. - NEGATIVE FOR EOSINOPHILS, DYSPLASIA, AND MALIGNANCY.  Comment: On endoscopy, glycogen acanthosis can appear as a white colored plaque and represents a mild increase in glycogen within the squamous cells. Glycogen acanthosis is a non-specific finding but can mimic some of the endoscopic findings of eosinophilic esophagitis.   GROSS DESCRIPTION: A. Labeled: C BX's esophagus rule out EOE Received: Formalin Tissue fragment(s): Multiple Size: Aggregate, 0.8 x 0.3 x 0.1 cm Description: Tan-pink soft tissue fragments  Entirely submitted in one cassette.    Final Diagnosis performed by Quay Burow, MD.   Electronically signed 02/05/2018 10:49:34AM The electronic signature indicates that the named Attending Pathologist has evaluated the specimen  Technical component performed at [facility], [address], [place], [place] [address] Lab: [phone] Dir: Rush Farmer, MD, MMM  Professional component performed at [facility], [facility], [place], [place], [place] [address] Lab: [phone] Dir: Dellia Nims. Rubinas, MD   Glucose, capillary     Status: Abnormal   Collection Time: 02/23/18  8:02 AM  Result Value Ref Range   Glucose-Capillary 193 (H) 70 - 99 mg/dL  Fungus Culture With Stain     Status: None (Preliminary result)   Collection Time: 02/23/18 10:03 AM  Result  Value Ref Range   Fungus Stain Final report     Comment: (NOTE) Performed At: [facility] [address] [place], [place] [id number] Rush Farmer MD SX:[id number]    Fungus (Mycology) Culture PENDING    Fungal Source LEFT     Comment: MAXILLARY SINUS Performed at [facility], [address] [place], [place] [address]  Aerobic/Anaerobic Culture (surgical/deep wound)     Status: None   Collection Time: 02/23/18 10:03 AM  Result Value Ref Range   Specimen Description      MAXILLARY SINUS LEFT Performed at [facility], [address] [place], [place] [address]    Special Requests      NONE Performed at [facility], [address] [place], [place] [address]    Gram Stain      FEW WBC PRESENT, PREDOMINANTLY PMN NO ORGANISMS SEEN    Culture      FEW NORMAL NASOPHARYNGEAL FLORA NO ANAEROBES ISOLATED Performed at [facility], [place] [address] [place], [place] [address]    Report Status 02/28/2018 FINAL   Fungus Culture Result     Status: None   Collection Time: 02/23/18 10:03 AM  Result Value Ref Range   Result 1 Comment     Comment: (NOTE) KOH/Calcofluor preparation:  no fungus observed. Performed At: [facility] [place], [place] [id number] Rush Farmer MD MK:[id number]   Aerobic/Anaerobic Culture (surgical/deep wound)     Status: None   Collection Time: 02/23/18 10:13 AM  Result Value Ref Range   Specimen Description      MAXILLARY SINUS RIGHT Performed at [facility], [address], [place], [place] [address]    Special Requests      NONE Performed at [facility], [address], [place], [place] [address]    Gram Stain      FEW WBC PRESENT, PREDOMINANTLY PMN NO ORGANISMS SEEN    Culture      FEW NORMAL NASOPHARYNGEAL FLORA NO ANAEROBES ISOLATED Performed at [facility], [place] [address] [place], [place] [address]    Report Status  02/28/2018 FINAL   Surgical pathology     Status: None   Collection Time: 02/23/18 10:41 AM  Result Value Ref Range   SURGICAL PATHOLOGY      Surgical Pathology CASE: [id number] PATIENT: [facility] Surgical Pathology Report     SPECIMEN SUBMITTED: A. Sinus contents, right B. Sinus contents, left  CLINICAL HISTORY: None provided  PRE-OPERATIVE DIAGNOSIS: Chronic sinusitis  POST-OPERATIVE DIAGNOSIS: Same as pre-op     DIAGNOSIS: A.  SINUS CONTENTS, RIGHT; REMOVAL: - CHRONIC RHINOSINUSITIS. - NEGATIVE FOR EOSINOPHILIC TISSUE INFILTRATES AND EOSINOPHILIC MUCIN.  B.  SINUS CONTENTS, LEFT; REMOVAL: - CHRONIC RHINOSINUSITIS. - NEGATIVE FOR TISSUE EOSINOPHILIC INFILTRATES AND EOSINOPHILIC MUCIN.   GROSS DESCRIPTION: A. Labeled: Right sinus contents Received: In formalin Size: Aggregate, 2.8 x 2.5 x 0.2 cm Description: Tan to brown tissue fragments  Block summary: 1 - representative tissue  Tissue decalcification: No  B. Labeled: Left sinus contents Received: In formalin Size: Aggregate, 2.1 x 0.6 x 0.1 cm Description: Tan to brown focally firm fragments  Block summary: 1 - ent irely submitted  Tissue decalcification: Yes   Final Diagnosis performed by Bryan Lemma, MD.   Electronically signed 02/25/2018 6:15:02PM The electronic signature indicates that the named Attending Pathologist has evaluated the specimen  Technical component performed at [facility], [address], [place], [place] [address] Lab: [phone] Dir: Rush Farmer, MD, MMM  Professional component performed at [facility], [facility], [place], [place], [place] [address] Lab: [phone] Dir: Dellia Nims. Rubinas, MD   Glucose, capillary     Status: Abnormal   Collection Time: 02/23/18 11:53 AM  Result Value Ref Range   Glucose-Capillary 174 (H) 70 - 99 mg/dL   Objective  There is no height or weight on file to calculate BMI. Wt Readings from Last 3 Encounters:   03/11/18 222 lb 9.6 oz (101 kg)  02/23/18 220 lb (99.8 kg)  02/02/18 220 lb (99.8 kg)   Temp Readings from Last 3 Encounters:  02/23/18 98.1 F (36.7 C)  02/02/18 (!) 96.7 F (35.9 C)  01/21/18 98.3 F (36.8 C) (Oral)   BP Readings from Last 3 Encounters:  03/11/18 120/76  02/23/18 (!) 161/88  02/02/18 (!) 151/105   Pulse Readings from Last 3 Encounters:  03/11/18 62  02/23/18 72  02/02/18 (!) 57    Physical Exam  Constitutional: He is oriented to person, place, and time. Vital signs are normal. He appears well-developed and well-nourished. He is cooperative.  HENT:  Head: Normocephalic and atraumatic.  Mouth/Throat: Oropharynx is clear and moist and mucous membranes are normal.  Eyes: Pupils are equal, round, and reactive to light. Conjunctivae are normal.  Cardiovascular: Normal rate, regular rhythm and normal heart sounds.  Pulmonary/Chest: Effort normal and breath sounds normal.  Neurological: He is alert and oriented to person, place, and time. Gait normal.  Skin: Skin is warm, dry and intact.  Psychiatric: He has a normal mood and affect. His speech is normal and behavior is normal. Judgment and thought content normal. Cognition and memory are normal.  Nursing note and vitals reviewed.   Assessment   1. DM 2 A1C 7.1 with proteinuria  2. Chronic sinusitis 3. Elevated BP monitor  4. HM Plan   1. lantus 24 units qd, stop glipizide 5, continue metformin Continue SSI using 6-8 units 2x per day  Cant tolerate statin  Disc ACEI/ARB today  Get records [facility] today for 2019 Normal foot exam except right tinea otc lamisil rec  2. F/u ENT  3. Monitor  4.  Given flu shot high dose prevnar, pna 23 had 10/24/15 due in 5 years, Tdap utd, shingrix per pt had 2/2 at [facility]  MMR immune   HCV neg 10/24/15  Colonoscopy had 09/11/17 for multiple polyps [facility] tubular f/u in 3 years  Dermatology yearly Dr. Nicole Kindred h/o Aks due to see w/in 6 months  PSA nl  01/08/18 0.53 do DRE in future   episource visit 03/12/18 PHQ 9 score 0 Provider: Dr. Olivia Mackie McLean-Scocuzza-Internal Medicine

## 2018-03-18 NOTE — Patient Instructions (Addendum)
Try Lamisil for your right foot    Ref. Range 07/20/2014 15:42 10/24/2015 08:39 10/07/2016 16:00 10/24/2016 12:28 01/08/2018 08:54 01/08/2018 08:56  Creatinine,U Latest Units: mg/dL 120.8 157.2  75.0    Microalb, Ur Latest Ref Range: 0.0 - 1.9 mg/dL 0.6 8.2 (H)  1.0    Microalbumin, Urine Latest Ref Range: Not Estab. ug/mL      53.2  MICROALB/CREAT RATIO Latest Ref Range: 0.0 - 30.0 mg/g creat 0.5 5.2  1.3  35.6 (H)  Urobilinogen, Ur Latest Ref Range: 0.2 - 1.0 mg/dL     0.2   Creatinine, Urine Latest Ref Range: Not Estab. mg/dL      149.4     Athlete's Foot Athlete's foot (tinea pedis) is a fungal infection of the skin on the feet. It often occurs on the skin that is between or underneath the toes. It can also occur on the soles of the feet. The infection can spread from person to person (is contagious). What are the causes? Athlete's foot is caused by a fungus. This fungus grows in warm, moist places. Most people get athlete's foot by sharing shower stalls, towels, and wet floors with someone who is infected. Not washing your feet or changing your socks often enough can contribute to athlete's foot. What increases the risk? This condition is more likely to develop in:  Men.  People who have a weak body defense system (immune system).  People who have diabetes.  People who use public showers, such as at a gym.  People who wear heavy-duty shoes, such as Environmental manager.  Seasons with warm, humid weather.  What are the signs or symptoms? Symptoms of this condition include:  Itchy areas between the toes or on the soles of the feet.  White, flaky, or scaly areas between the toes or on the soles of the feet.  Very itchy small blisters between the toes or on the soles of the feet.  Small cuts on the skin. These cuts can become infected.  Thick or discolored toenails.  How is this diagnosed? This condition is diagnosed with a medical history and physical exam. Your  health care provider may also take a skin or toenail sample to be examined. How is this treated? Treatment for this condition includes antifungal medicines. These may be applied as powders, ointments, or creams. In severe cases, an oral antifungal medicine may be given. Follow these instructions at home:  Apply or take over-the-counter and prescription medicines only as told by your health care provider.  Keep all follow-up visits as told by your health care provider. This is important.  Do not scratch your feet.  Keep your feet dry: ? Wear Janvier or wool socks. Change your socks every day or if they become wet. ? Wear shoes that allow air to circulate, such as sandals or canvas tennis shoes.  Wash and dry your feet: ? Every day or as told by your health care provider. ? After exercising. ? Including the area between your toes.  Do not share towels, nail clippers, or other personal items that touch your feet with others.  If you have diabetes, keep your blood sugar under control. How is this prevented?  Do not share towels.  Wear sandals in wet areas, such as locker rooms and shared showers.  Keep your feet dry: ? Wear Broaden or wool socks. Change your socks every day or if they become wet. ? Wear shoes that allow air to circulate, such as sandals or  canvas tennis shoes.  Wash and dry your feet after exercising. Pay attention to the area between your toes. Contact a health care provider if:  You have a fever.  You have swelling, soreness, warmth, or redness in your foot.  You are not getting better with treatment.  Your symptoms get worse.  You have new symptoms. This information is not intended to replace advice given to you by your health care provider. Make sure you discuss any questions you have with your health care provider. Document Released: 05/30/2000 Document Revised: 11/08/2015 Document Reviewed: 12/04/2014 Elsevier Interactive Patient Education  2018  [employer].

## 2018-03-24 DIAGNOSIS — J324 Chronic pansinusitis: Secondary | ICD-10-CM | POA: Diagnosis not present

## 2018-03-24 DIAGNOSIS — J329 Chronic sinusitis, unspecified: Secondary | ICD-10-CM | POA: Diagnosis not present

## 2018-03-24 LAB — FUNGUS CULTURE WITH STAIN

## 2018-03-24 LAB — FUNGUS CULTURE RESULT

## 2018-03-24 LAB — FUNGAL ORGANISM REFLEX

## 2018-04-22 ENCOUNTER — Other Ambulatory Visit (INDEPENDENT_AMBULATORY_CARE_PROVIDER_SITE_OTHER): Payer: PPO

## 2018-04-22 DIAGNOSIS — J329 Chronic sinusitis, unspecified: Secondary | ICD-10-CM | POA: Diagnosis not present

## 2018-04-22 DIAGNOSIS — Z794 Long term (current) use of insulin: Secondary | ICD-10-CM | POA: Diagnosis not present

## 2018-04-22 DIAGNOSIS — E119 Type 2 diabetes mellitus without complications: Secondary | ICD-10-CM

## 2018-04-22 LAB — BASIC METABOLIC PANEL
BUN: 13 mg/dL (ref 6–23)
CHLORIDE: 103 meq/L (ref 96–112)
CO2: 29 mEq/L (ref 19–32)
CREATININE: 1.01 mg/dL (ref 0.40–1.50)
Calcium: 9.3 mg/dL (ref 8.4–10.5)
GFR: 77.5 mL/min (ref >60.00)
Glucose, Bld: 150 mg/dL — ABNORMAL HIGH (ref 70–99)
Potassium: 4.9 mEq/L (ref 3.5–5.1)
Sodium: 138 mEq/L (ref 135–145)

## 2018-04-22 LAB — HEMOGLOBIN A1C: Hgb A1c MFr Bld: 7 % — ABNORMAL HIGH (ref 4.6–6.5)

## 2018-04-22 LAB — MICROALBUMIN / CREATININE URINE RATIO
Creatinine,U: 87.9 mg/dL
MICROALB UR: 0.7 mg/dL (ref 0.0–1.9)
MICROALB/CREAT RATIO: 0.8 mg/g (ref 0.0–30.0)

## 2018-05-21 DIAGNOSIS — J329 Chronic sinusitis, unspecified: Secondary | ICD-10-CM | POA: Diagnosis not present

## 2018-07-01 ENCOUNTER — Encounter: Payer: Self-pay | Admitting: Internal Medicine

## 2018-07-01 ENCOUNTER — Other Ambulatory Visit: Payer: Self-pay | Admitting: Internal Medicine

## 2018-07-01 DIAGNOSIS — Z794 Long term (current) use of insulin: Principal | ICD-10-CM

## 2018-07-01 DIAGNOSIS — E119 Type 2 diabetes mellitus without complications: Secondary | ICD-10-CM

## 2018-07-05 DIAGNOSIS — J329 Chronic sinusitis, unspecified: Secondary | ICD-10-CM | POA: Diagnosis not present

## 2018-07-05 DIAGNOSIS — J029 Acute pharyngitis, unspecified: Secondary | ICD-10-CM | POA: Diagnosis not present

## 2018-07-09 ENCOUNTER — Telehealth: Payer: Self-pay | Admitting: *Deleted

## 2018-07-09 DIAGNOSIS — J329 Chronic sinusitis, unspecified: Secondary | ICD-10-CM | POA: Diagnosis not present

## 2018-07-09 DIAGNOSIS — R070 Pain in throat: Secondary | ICD-10-CM | POA: Diagnosis not present

## 2018-07-09 NOTE — Telephone Encounter (Signed)
Copied from [place] [id number]. Topic: Appointment Scheduling - Scheduling Inquiry for Clinic >> Jul 09, 2018  9:30 AM Alexis Sharp, NT wrote: Reason for CRM: patient is calling to reschedule his 07/20/18 appointments. He is wanting to reschedule them both back to back for a day close to the 4th. Please contact.

## 2018-07-13 DIAGNOSIS — H401131 Primary open-angle glaucoma, bilateral, mild stage: Secondary | ICD-10-CM | POA: Diagnosis not present

## 2018-07-13 NOTE — Telephone Encounter (Signed)
Pt appts have been rescheduled.

## 2018-07-15 DIAGNOSIS — H1859 Other hereditary corneal dystrophies: Secondary | ICD-10-CM | POA: Diagnosis not present

## 2018-07-15 LAB — HM DIABETES EYE EXAM

## 2018-07-16 ENCOUNTER — Encounter: Payer: Self-pay | Admitting: Internal Medicine

## 2018-07-20 ENCOUNTER — Ambulatory Visit: Payer: PPO

## 2018-07-20 ENCOUNTER — Ambulatory Visit: Payer: PPO | Admitting: Internal Medicine

## 2018-08-06 ENCOUNTER — Ambulatory Visit: Payer: PPO

## 2018-08-06 ENCOUNTER — Ambulatory Visit: Payer: PPO | Admitting: Internal Medicine

## 2018-08-06 ENCOUNTER — Other Ambulatory Visit: Payer: Self-pay | Admitting: Internal Medicine

## 2018-08-06 ENCOUNTER — Ambulatory Visit (INDEPENDENT_AMBULATORY_CARE_PROVIDER_SITE_OTHER): Payer: PPO | Admitting: Internal Medicine

## 2018-08-06 ENCOUNTER — Encounter: Payer: Self-pay | Admitting: Internal Medicine

## 2018-08-06 ENCOUNTER — Telehealth: Payer: Self-pay | Admitting: Internal Medicine

## 2018-08-06 ENCOUNTER — Ambulatory Visit (INDEPENDENT_AMBULATORY_CARE_PROVIDER_SITE_OTHER): Payer: PPO

## 2018-08-06 VITALS — BP 138/70 | HR 67 | Temp 98.1°F | Ht 71.0 in | Wt 225.4 lb

## 2018-08-06 VITALS — BP 138/70 | HR 67 | Temp 98.1°F | Resp 16 | Ht 71.0 in | Wt 225.4 lb

## 2018-08-06 DIAGNOSIS — J309 Allergic rhinitis, unspecified: Secondary | ICD-10-CM

## 2018-08-06 DIAGNOSIS — Z794 Long term (current) use of insulin: Secondary | ICD-10-CM | POA: Diagnosis not present

## 2018-08-06 DIAGNOSIS — R059 Cough, unspecified: Secondary | ICD-10-CM

## 2018-08-06 DIAGNOSIS — I10 Essential (primary) hypertension: Secondary | ICD-10-CM

## 2018-08-06 DIAGNOSIS — J40 Bronchitis, not specified as acute or chronic: Secondary | ICD-10-CM

## 2018-08-06 DIAGNOSIS — R05 Cough: Secondary | ICD-10-CM

## 2018-08-06 DIAGNOSIS — Z0000 Encounter for general adult medical examination without abnormal findings: Secondary | ICD-10-CM | POA: Diagnosis not present

## 2018-08-06 DIAGNOSIS — R030 Elevated blood-pressure reading, without diagnosis of hypertension: Secondary | ICD-10-CM | POA: Diagnosis not present

## 2018-08-06 DIAGNOSIS — J329 Chronic sinusitis, unspecified: Secondary | ICD-10-CM | POA: Diagnosis not present

## 2018-08-06 DIAGNOSIS — E119 Type 2 diabetes mellitus without complications: Secondary | ICD-10-CM | POA: Diagnosis not present

## 2018-08-06 DIAGNOSIS — T466X5A Adverse effect of antihyperlipidemic and antiarteriosclerotic drugs, initial encounter: Secondary | ICD-10-CM

## 2018-08-06 DIAGNOSIS — G720 Drug-induced myopathy: Secondary | ICD-10-CM

## 2018-08-06 HISTORY — DX: Essential (primary) hypertension: I10

## 2018-08-06 MED ORDER — ALBUTEROL SULFATE (2.5 MG/3ML) 0.083% IN NEBU
2.5000 mg | INHALATION_SOLUTION | Freq: Once | RESPIRATORY_TRACT | Status: AC
  Administered 2018-08-06: 2.5 mg via RESPIRATORY_TRACT

## 2018-08-06 MED ORDER — IPRATROPIUM BROMIDE 0.02 % IN SOLN
0.5000 mg | Freq: Once | RESPIRATORY_TRACT | Status: AC
  Administered 2018-08-06: 0.5 mg via RESPIRATORY_TRACT

## 2018-08-06 MED ORDER — ALBUTEROL SULFATE HFA 108 (90 BASE) MCG/ACT IN AERS: 1.0000 | INHALATION_SPRAY | RESPIRATORY_TRACT | 11 refills | Status: DC | PRN

## 2018-08-06 NOTE — Progress Notes (Signed)
Agree with note   TMS 

## 2018-08-06 NOTE — Progress Notes (Signed)
No chief complaint on file.  F/u 1. DM 2 A1C 7.0 04/2018 upcomign appt endocrine 09/30/2018 he is on lantus 18 units qhs, metformin, not using prn SSI insulin due to it not working as well cbg this am was 147 normally <120  2. Chronic sinus issues appt with ENT upcoming next week on abx and nasal sprays but c/o cough with yellow white phelgm tried Mucinex but stopped due to he felt like making mucous worse.  3. BP elevated today he had 3 cups coffee declines medication for now and declines statin    Review of Systems  Constitutional: Negative for weight loss.  HENT: Negative for hearing loss.   Eyes: Negative for blurred vision.  Respiratory: Positive for cough and sputum production.   Cardiovascular: Negative for chest pain.  Gastrointestinal: Negative for abdominal pain.  Musculoskeletal: Negative for falls.  Skin: Negative for rash.  Neurological: Negative for headaches.  Psychiatric/Behavioral: Negative for depression and memory loss.   Past Medical History:  Diagnosis Date  . Allergy    dust, grass   . Diabetes mellitus   . Glaucoma    [facility] q 6 months   . Hyperlipidemia   . Migraines    rare   . Reflux esophagitis   . Wears hearing aid in both ears    Past Surgical History:  Procedure Laterality Date  . CATARACT EXTRACTION, BILATERAL    . COLONOSCOPY WITH PROPOFOL N/A 09/11/2017   Procedure: COLONOSCOPY WITH PROPOFOL;  Surgeon: Manya Silvas, MD;  Location: [facility] ENDOSCOPY;  Service: Endoscopy;  Laterality: N/A;  . ESOPHAGOGASTRODUODENOSCOPY (EGD) WITH PROPOFOL N/A 02/02/2018   Procedure: ESOPHAGOGASTRODUODENOSCOPY (EGD) WITH PROPOFOL;  Surgeon: [name], MD;  Location: [facility] ENDOSCOPY;  Service: Gastroenterology;  Laterality: N/A;  . EYE SURGERY    . HERNIA REPAIR    . IMAGE GUIDED SINUS SURGERY Bilateral 02/23/2018   Procedure: IMAGE GUIDED SINUS SURGERY Right ethmoidectomy with frontal exploration bilteral maxillary antrostomies Right sphenoidectomy;   Surgeon: Clyde Canterbury, MD;  Location: [facility];  Service: ENT;  Laterality: Bilateral;  NEED STRYKER DISK GAVE DISK TO [facility] 9-9  . UMBILICAL HERNIA REPAIR  2002   Family History  Problem Relation Age of Onset  . Diabetes Mother   . Diabetes Sister   . Heart disease Brother   . Diabetes Brother   . Heart disease Brother   . Diabetes Brother   . Diabetes Brother   . Diabetes Sister   . Diabetes Sister    Social History   Socioeconomic History  . Marital status: Married    Spouse name: Not on file  . Number of children: 2  . Years of education: Not on file  . Highest education level: Not on file  Occupational History  . Occupation: semi retired Museum/gallery curator asst,  does Engineer, manufacturing: SELF EMPLOYED  Social Needs  . Financial resource strain: Not on file  . Food insecurity:    Worry: Not on file    Inability: Not on file  . Transportation needs:    Medical: Not on file    Non-medical: Not on file  Tobacco Use  . Smoking status: Former Research scientist (life sciences)  . Smokeless tobacco: Never Used  . Tobacco comment: smoked some as teenager  Substance and Sexual Activity  . Alcohol use: No    Alcohol/week: 0.0 standard drinks  . Drug use: No  . Sexual activity: Yes  Lifestyle  . Physical activity:    Days per week:  Not on file    Minutes per session: Not on file  . Stress: Not on file  Relationships  . Social connections:    Talks on phone: Not on file    Gets together: Not on file    Attends religious service: Not on file    Active member of club or organization: Not on file    Attends meetings of clubs or organizations: Not on file    Relationship status: Not on file  . Intimate partner violence:    Fear of current or ex partner: Not on file    Emotionally abused: Not on file    Physically abused: Not on file    Forced sexual activity: Not on file  Other Topics Concern  . Not on file  Social History Narrative   No living will   No health care  POA---okay with wife making decisions. Daughter Joelene Millin would be alternate   Would accept resuscitation   Not sure about tube feeds   Married    Current Meds  Medication Sig  . bimatoprost (LUMIGAN) 0.01 % SOLN Place 1 drop into both eyes at bedtime.   . Continuous Blood Gluc Sensor (FREESTYLE LIBRE 14 DAY SENSOR) MISC USE TO CHECK BLOOD GLUCOSE FOR 14 DAYS  . glucose blood (ONE TOUCH ULTRA TEST) test strip USE ONE STRIP TO CHECK GLUCOSE ONCE DAILY  . insulin aspart (NOVOLOG FLEXPEN) 100 UNIT/ML FlexPen <70 0 units, 70-130 0 units,  131-180 2 units, 181-240 4 units, 241-300 6 units, 301-350 8 units, 351-400 10 units, >400 12 units and call doctor  . insulin glargine (LANTUS) 100 UNIT/ML injection Inject into the skin daily.  . Insulin Pen Needle (PEN NEEDLES) 31G X 6 MM MISC 1 Device by Does not apply route 3 (three) times daily as needed.  . Insulin Pen Needle (PEN NEEDLES) 32G X 4 MM MISC 1 Units by Does not apply route daily. Use 1 pen needle daily with Lantus  . ipratropium (ATROVENT) 0.06 % nasal spray Place 2 sprays into both nostrils 4 (four) times daily.  . metFORMIN (GLUCOPHAGE) 500 MG tablet Take 1 tablet (500 mg total) by mouth 3 (three) times daily. (Patient taking differently: Take 500 mg by mouth 2 (two) times daily with a meal. )  . Multiple Vitamin (MULTIVITAMIN) capsule Take 1 capsule by mouth daily.    Marland Kitchen omeprazole (PRILOSEC) 20 MG capsule Take 1 capsule (20 mg total) by mouth daily.  Glory Rosebush DELICA LANCETS FINE MISC Use to check blood sugar once a day. Dx Code E11.9  . timolol (TIMOPTIC) 0.5 % ophthalmic solution Place 1 drop into both eyes daily.    Allergies  Allergen Reactions  . Codeine   . Pioglitazone     REACTION: achy  . Sitagliptin Phosphate     REACTION: muscle aches  . Atorvastatin Other (See Comments)    myalgia  . Crestor [Rosuvastatin]     Muscle aching   Recent Results (from the past 2160 hour(s))  HM DIABETES EYE EXAM     Status: None    Collection Time: 07/15/18 12:00 AM  Result Value Ref Range   HM Diabetic Eye Exam No Retinopathy No Retinopathy    Comment: AE 07/15/2018   Objective  Body mass index is 31.44 kg/m. Wt Readings from Last 3 Encounters:  08/06/18 225 lb 6.4 oz (102.2 kg)  03/18/18 220 lb (99.8 kg)  03/11/18 222 lb 9.6 oz (101 kg)   Temp Readings from Last 3 Encounters:  08/06/18 98.1 F (36.7 C) (Oral)  03/18/18 97.8 F (36.6 C) (Oral)  02/23/18 98.1 F (36.7 C)   BP Readings from Last 3 Encounters:  08/06/18 138/70  03/18/18 140/90  03/11/18 120/76   Pulse Readings from Last 3 Encounters:  08/06/18 67  03/18/18 68  03/11/18 62    Physical Exam Vitals signs and nursing note reviewed.  Constitutional:      Appearance: Normal appearance. He is well-developed and well-groomed.  HENT:     Head: Normocephalic and atraumatic.     Nose: Nose normal.     Mouth/Throat:     Mouth: Mucous membranes are moist.     Pharynx: Oropharynx is clear.  Eyes:     Conjunctiva/sclera: Conjunctivae normal.     Pupils: Pupils are equal, round, and reactive to light.  Cardiovascular:     Rate and Rhythm: Normal rate and regular rhythm.     Heart sounds: Normal heart sounds.  Pulmonary:     Effort: Pulmonary effort is normal.     Breath sounds: Rhonchi present.     Comments: B/l rhonchi Skin:    General: Skin is warm and dry.  Neurological:     General: No focal deficit present.     Mental Status: He is alert and oriented to person, place, and time. Mental status is at baseline.     Gait: Gait normal.  Psychiatric:        Attention and Perception: Attention and perception normal.        Mood and Affect: Mood and affect normal.        Speech: Speech normal.        Behavior: Behavior normal. Behavior is cooperative.        Thought Content: Thought content normal.        Cognition and Memory: Cognition and memory normal.        Judgment: Judgment normal.     Assessment   1. DM 2 7.0 2. Chronic  sinus issues and cough h/o allergies  3. Elevated BP  4. HM Plan   1. Cont meds lantus 18 qhs, not really using prn SSI Was on glipizide in the past as well  Consider januvia/jardiance with Dr. Gabriel Carina in future with metformin appt 09/20/2018  Eye exam normal 07/15/2018   2. CXR today  ENT appt next week currently on abx per ent  duoneb x 1 today and CXR ask about allergy testing blood test 3. Declines statin or ARB for now  4.  Flu shot utd  prevnar, pna 23 had 10/24/15 due in 5 years, Tdap utd, shingrix per pt had 2/2 at [place]  MMR immune   HCV neg 10/24/15  Colonoscopy had 09/11/17 for multiple polyps [facility] tubular f/u in 3 years  Dermatology yearly Dr. Nicole Kindred h/o Aks due to see w/in 6 months next week appt  PSA nl 01/08/18 0.53 do DRE in future  AE 07/15/2018 no retinopathy eye surface disease b/l 1 year ago, glaucoma surgery 2019 to reduce pressure   episource visit 03/12/18 PHQ 9 score 0 Provider: Dr. Olivia Mackie McLean-Scocuzza-[facility]

## 2018-08-06 NOTE — Patient Instructions (Addendum)
09/30/2018 Initial consult Endocrinology Solum, Felipa Evener, MD  [place]  [facility] [place], [place] [address]  [phone]  [phone] (Fax)    Think about Januvia and Jardiance   Consider blood testing for allergies with ENT  Consider singulair for allergies   Montelukast oral tablets What is this medicine? MONTELUKAST (mon te LOO kast) is used to prevent and treat the symptoms of asthma. It is also used to treat allergies. Do not use for an acute asthma attack. This medicine may be used for other purposes; ask your health care provider or pharmacist if you have questions. COMMON BRAND NAME(S): Singulair What should I tell my health care provider before I take this medicine? They need to know if you have any of these conditions: -liver disease -an unusual or allergic reaction to montelukast, other medicines, foods, dyes, or preservatives -pregnant or trying to get pregnant -breast-feeding How should I use this medicine? This medicine should be given by mouth. Follow the directions on the prescription label. Take this medicine at the same time every day. You may take this medicine with or without meals. Do not chew the tablets. Do not stop taking your medicine unless your doctor tells you to. Talk to your pediatrician regarding the use of this medicine in children. Special care may be needed. While this drug may be prescribed for children as young as 13 years of age for selected conditions, precautions do apply. Overdosage: If you think you have taken too much of this medicine contact a poison control center or emergency room at once. NOTE: This medicine is only for you. Do not share this medicine with others. What if I miss a dose? If you miss a dose, take it as soon as you can. If it is almost time for your next dose, take only that dose. Do not take double or extra doses. What may interact with this medicine? -anti-infectives like rifampin and  rifabutin -medicines for seizures like phenytoin, phenobarbital, and carbamazepine This list may not describe all possible interactions. Give your health care provider a list of all the medicines, herbs, non-prescription drugs, or dietary supplements you use. Also tell them if you smoke, drink alcohol, or use illegal drugs. Some items may interact with your medicine. What should I watch for while using this medicine? Visit your doctor or health care professional for regular checks on your progress. Tell your doctor or health care professional if your allergy or asthma symptoms do not improve. Take your medicine even when you do not have symptoms. Do not stop taking any of your medicine(s) unless your doctor tells you to. If you have asthma, talk to your doctor about what to do in an acute asthma attack. Always have your inhaled rescue medicine for asthma attacks with you. Patients and their families should watch for new or worsening thoughts of suicide or depression. Also watch for sudden changes in feelings such as feeling anxious, agitated, panicky, irritable, hostile, aggressive, impulsive, severely restless, overly excited and hyperactive, or not being able to sleep. Any worsening of mood or thoughts of suicide or dying should be reported to your health care professional right away. What side effects may I notice from receiving this medicine? Side effects that you should report to your doctor or health care professional as soon as possible: -allergic reactions like skin rash or hives, or swelling of the face, lips, or tongue -breathing problems -changes in emotions or moods -confusion -depressed mood -fever or infection -hallucinations -joint  pain -painful lumps under the skin -pain, tingling, numbness in the hands or feet -redness, blistering, peeling, or loosening of the skin, including inside the mouth -restlessness -seizures -sleep walking -signs and symptoms of infection like fever;  chills; cough; sore throat; flu-like illness -signs and symptoms of liver injury like dark yellow or brown urine; general ill feeling or flu-like symptoms; light-colored stools; loss of appetite; nausea; right upper belly pain; unusually weak or tired; yellowing of the eyes or skin -sinus pain or swelling -stuttering -suicidal thoughts or other mood changes -tremors -trouble sleeping -uncontrolled muscle movements -unusual bleeding or bruising -vivid or bad dreams Side effects that usually do not require medical attention (report to your doctor or health care professional if they continue or are bothersome): -dizziness -drowsiness -headache -runny nose -stomach upset -tiredness This list may not describe all possible side effects. Call your doctor for medical advice about side effects. You may report side effects to FDA at 1-800-FDA-1088. Where should I keep my medicine? Keep out of the reach of children. Store at room temperature between 15 and 30 degrees C (59 and 86 degrees F). Protect from light and moisture. Keep this medicine in the original bottle. Throw away any unused medicine after the expiration date. NOTE: This sheet is a summary. It may not cover all possible information. If you have questions about this medicine, talk to your doctor, pharmacist, or health care provider.  2019 Elsevier/Gold Standard (2018-01-26 13:51:04)

## 2018-08-06 NOTE — Progress Notes (Signed)
Pre visit review using our clinic review tool, if applicable. No additional management support is needed unless otherwise documented below in the visit note. 

## 2018-08-06 NOTE — Progress Notes (Signed)
Subjective:   Alexis Sharp is a 73 y.o. male who presents for Medicare Annual/Subsequent preventive examination.  Review of Systems:  No ROS.  Medicare Wellness Visit. Additional risk factors are reflected in the social history. Cardiac Risk Factors include: advanced age (>51men, >76 women);diabetes mellitus;male gender     Objective:    Vitals: BP 138/70 (BP Location: Left Arm, Patient Position: Sitting, Cuff Size: Normal)   Pulse 67   Temp 98.1 F (36.7 C) (Oral)   Resp 16   Ht 5\' 11"  (1.803 m)   Wt 225 lb 6.4 oz (102.2 kg)   SpO2 97%   BMI 31.44 kg/m   Body mass index is 31.44 kg/m.  Advanced Directives 08/06/2018 02/23/2018 02/02/2018 01/21/2018 10/24/2015 07/20/2014  Does Patient Have a Medical Advance Directive? No No No No No No  Would patient like information on creating a medical advance directive? No - Patient declined No - Patient declined No - Patient declined No - Patient declined No - patient declined information Yes - Scientist, clinical (histocompatibility and immunogenetics) given    Tobacco Social History   Tobacco Use  Smoking Status Former Smoker  Smokeless Tobacco Never Used  Tobacco Comment   smoked some as teenager     Counseling given: Not Answered Comment: smoked some as teenager   Clinical Intake:  Pre-visit preparation completed: Yes  Pain : No/denies pain     Diabetes: Yes(Followed by pcp and Endocrinology)  How often do you need to have someone help you when you read instructions, pamphlets, or other written materials from your doctor or pharmacy?: 1 - Never  Interpreter Needed?: No     Past Medical History:  Diagnosis Date  . Allergy    dust, grass   . Diabetes mellitus   . Glaucoma    [facility] q 6 months   . Hyperlipidemia   . Migraines    rare   . Reflux esophagitis   . Wears hearing aid in both ears    Past Surgical History:  Procedure Laterality Date  . CATARACT EXTRACTION, BILATERAL    . COLONOSCOPY WITH PROPOFOL N/A 09/11/2017   Procedure:  COLONOSCOPY WITH PROPOFOL;  Surgeon: Manya Silvas, MD;  Location[facility] ENDOSCOPY;  Service: Endoscopy;  Laterality: N/A;  . ESOPHAGOGASTRODUODENOSCOPY (EGD) WITH PROPOFOL N/A 02/02/2018   Procedure: ESOPHAGOGASTRODUODENOSCOPY (EGD) WITH PROPOFOL;  Surgeon: [name], MD;  Location: [facility] ENDOSCOPY;  Service: Gastroenterology;  Laterality: N/A;  . EYE SURGERY    . HERNIA REPAIR    . IMAGE GUIDED SINUS SURGERY Bilateral 02/23/2018   Procedure: IMAGE GUIDED SINUS SURGERY Right ethmoidectomy with frontal exploration bilteral maxillary antrostomies Right sphenoidectomy;  Surgeon: Clyde Canterbury, MD;  Location: [place];  Service: ENT;  Laterality: Bilateral;  NEED STRYKER DISK GAVE DISK TO [facility] 9-9  . UMBILICAL HERNIA REPAIR  2002   Family History  Problem Relation Age of Onset  . Diabetes Mother   . Diabetes Sister   . Heart disease Brother   . Diabetes Brother   . Heart disease Brother   . Diabetes Brother   . Diabetes Brother   . Diabetes Sister   . Diabetes Sister    Social History   Socioeconomic History  . Marital status: Married    Spouse name: Not on file  . Number of children: 2  . Years of education: Not on file  . Highest education level: Not on file  Occupational History  . Occupation: semi retired Museum/gallery curator asst,  does Software engineer  Employer: SELF EMPLOYED  Social Needs  . Financial resource strain: Not hard at all  . Food insecurity:    Worry: Never true    Inability: Never true  . Transportation needs:    Medical: No    Non-medical: No  Tobacco Use  . Smoking status: Former Research scientist (life sciences)  . Smokeless tobacco: Never Used  . Tobacco comment: smoked some as teenager  Substance and Sexual Activity  . Alcohol use: No    Alcohol/week: 0.0 standard drinks  . Drug use: No  . Sexual activity: Yes  Lifestyle  . Physical activity:    Days per week: 4 days    Minutes per session: 60 min  . Stress: Not at all  Relationships  . Social  connections:    Talks on phone: Not on file    Gets together: Not on file    Attends religious service: Not on file    Active member of club or organization: Not on file    Attends meetings of clubs or organizations: Not on file    Relationship status: Not on file  Other Topics Concern  . Not on file  Social History Narrative   No living will   No health care POA---okay with wife making decisions. Daughter Alexis Sharp would be alternate   Would accept resuscitation   Not sure about tube feeds   Married     Outpatient Encounter Medications as of 08/06/2018  Medication Sig  . bimatoprost (LUMIGAN) 0.01 % SOLN Place 1 drop into both eyes at bedtime.   . Continuous Blood Gluc Sensor (FREESTYLE LIBRE 14 DAY SENSOR) MISC USE TO CHECK BLOOD GLUCOSE FOR 14 DAYS  . glucose blood (ONE TOUCH ULTRA TEST) test strip USE ONE STRIP TO CHECK GLUCOSE ONCE DAILY  . insulin aspart (NOVOLOG FLEXPEN) 100 UNIT/ML FlexPen <70 0 units, 70-130 0 units,  131-180 2 units, 181-240 4 units, 241-300 6 units, 301-350 8 units, 351-400 10 units, >400 12 units and call doctor  . insulin glargine (LANTUS) 100 UNIT/ML injection Inject into the skin daily.  . Insulin Pen Needle (PEN NEEDLES) 31G X 6 MM MISC 1 Device by Does not apply route 3 (three) times daily as needed.  . Insulin Pen Needle (PEN NEEDLES) 32G X 4 MM MISC 1 Units by Does not apply route daily. Use 1 pen needle daily with Lantus  . ipratropium (ATROVENT) 0.06 % nasal spray Place 2 sprays into both nostrils 4 (four) times daily.  . metFORMIN (GLUCOPHAGE) 500 MG tablet Take 1 tablet (500 mg total) by mouth 3 (three) times daily. (Patient taking differently: Take 500 mg by mouth 2 (two) times daily with a meal. )  . Multiple Vitamin (MULTIVITAMIN) capsule Take 1 capsule by mouth daily.    Marland Kitchen omeprazole (PRILOSEC) 20 MG capsule Take 1 capsule (20 mg total) by mouth daily.  Glory Rosebush DELICA LANCETS FINE MISC Use to check blood sugar once a day. Dx Code E11.9  .  timolol (TIMOPTIC) 0.5 % ophthalmic solution Place 1 drop into both eyes daily.   . [DISCONTINUED] doxycycline (VIBRAMYCIN) 100 MG capsule 100 mg PO BID x 10 days  . [DISCONTINUED] HYDROcodone-acetaminophen (NORCO/VICODIN) 5-325 MG tablet Take 1-2 tablets by mouth every 6 (six) hours as needed for moderate pain.   No facility-administered encounter medications on file as of 08/06/2018.     Activities of Daily Living In your present state of health, do you have any difficulty performing the following activities: 08/06/2018 02/23/2018  Hearing? Aggie Moats  Comment Hearing aids -  Vision? N N  Difficulty concentrating or making decisions? N N  Walking or climbing stairs? N N  Dressing or bathing? N N  Doing errands, shopping? N -  Preparing Food and eating ? N -  Using the Toilet? N -  In the past six months, have you accidently leaked urine? N -  Do you have problems with loss of bowel control? N -  Managing your Medications? N -  Managing your Finances? N -  Housekeeping or managing your Housekeeping? N -  Some recent data might be hidden    Patient Care Team: McLean-Scocuzza, Nino Glow, MD as PCP - General (Internal Medicine) Marylynn Pearson, MD as Consulting Physician (Ophthalmology)   Assessment:   This is a routine wellness examination for [place].  Diabetes- reports averaging fasting blood sugars 100-105. He tries to keep non fasting blood sugars 150 and below; otherwise takes medication as directed.  Health Screenings  Colonoscopy -09/11/17 PSA 01/08/18 (0.53) Glaucoma -yes Hearing -hearing aids. Followed by [facility].  Hemoglobin A1C -04/22/18 (7.0) TSH -01/08/18 (1.63)  Social  Alcohol intake -no Smoking history -former Smokers in home? none Illicit drug use? none Exercise -walking Diet -low carb Sexually Active -yes Multiple Partners -no  Safety  Patient feels safe at home.  Patient does have smoke detectors at home  Patient does wear sunscreen or protective clothing when in  direct sunlight.  Patient does wear seat belt when driving or riding with others.   Activities of Daily Living Patient can do their own household chores. Denies needing assistance with: driving, feeding themselves, getting from bed to chair, getting to the toilet, bathing/showering, dressing, managing money, climbing flight of stairs, or preparing meals.   Depression Screen Patient denies losing interest in daily life, feeling hopeless, or crying easily over simple problems.   Fall Screen Patient denies being afraid of falling or falling in the last year.   Memory Screen Patient denies problems with memory, misplacing items, and is able to balance checkbook/bank accounts.  Patient is alert, normal appearance, oriented to person/place/and time. Correctly identified the president of the [place], recall of 3/3 objects, and performing simple calculations.  Patient displays appropriate judgement and can read correct time from watch face.   Immunizations The following Immunizations are up to date: Influenza, shingles, pneumonia, and tetanus.   Other Providers Patient Care Team: McLean-Scocuzza, Nino Glow, MD as PCP - General (Internal Medicine) Marylynn Pearson, MD as Consulting Physician (Ophthalmology)  Exercise Activities and Dietary recommendations Current Exercise Habits: Home exercise routine, Type of exercise: walking, Time (Minutes): 60, Frequency (Times/Week): 4, Weekly Exercise (Minutes/Week): 240, Intensity: Moderate  Goals      Patient Stated   . Weight (lb) < 200 lb (90.7 kg) (pt-stated)     Monitor carb intake  Stay hydrated Increase physical activity       Fall Risk Fall Risk  08/06/2018 12/23/2017 02/03/2017 10/24/2015 07/20/2014  Falls in the past year? 0 No No No No   Depression Screen PHQ 2/9 Scores 08/06/2018 12/23/2017 02/03/2017 10/24/2015  PHQ - 2 Score 0 0 0 0    Cognitive Function MMSE - Mini Mental State Exam 10/24/2015  Orientation to time 5  Orientation to Place  5  Registration 3  Attention/ Calculation 0  Recall 3  Language- name 2 objects 0  Language- repeat 1  Language- follow 3 step command 3  Language- read & follow direction 0  Write a sentence 0  Copy design 0  Total score 20     6CIT Screen 08/06/2018  What Year? 0 points  What month? 0 points  What time? 0 points  Count back from 20 0 points  Months in reverse 0 points  Repeat phrase 0 points  Total Score 0    Immunization History  Administered Date(s) Administered  . Influenza Whole 04/19/2007, 03/15/2009, 04/16/2010  . Influenza, High Dose Seasonal PF 03/18/2018  . Influenza, Seasonal, Injecte, Preservative Fre 03/30/2016  . Influenza,inj,Quad PF,6+ Mos 03/23/2015, 03/31/2017, 02/24/2018  . Influenza-Unspecified 04/18/2013, 04/16/2014  . Pneumococcal Conjugate-13 07/20/2014  . Pneumococcal Polysaccharide-23 04/16/2010, 10/24/2015  . Td 06/17/2003, 06/29/2013  . Zoster 12/25/2010  . Zoster Recombinat (Shingrix) 12/09/2017, 03/15/2018   Screening Tests Health Maintenance  Topic Date Due  . HEMOGLOBIN A1C  10/21/2018  . FOOT EXAM  03/19/2019  . URINE MICROALBUMIN  04/23/2019  . OPHTHALMOLOGY EXAM  07/16/2019  . TETANUS/TDAP  06/30/2023  . COLONOSCOPY  09/12/2027  . INFLUENZA VACCINE  Completed  . Hepatitis C Screening  Completed  . PNA vac Low Risk Adult  Completed       Plan:   End of life planning; Advanced aging; Advanced directives discussed.  No HCPOA/Living Will.  Additional information declined at this time.  I have personally reviewed and noted the following in the patient's chart:   . Medical and social history . Use of alcohol, tobacco or illicit drugs  . Current medications and supplements . Functional ability and status . Nutritional status . Physical activity . Advanced directives . List of other physicians . Hospitalizations, surgeries, and ER visits in previous 12 months . Vitals . Screenings to include cognitive, depression, and  falls . Referrals and appointments  In addition, I have reviewed and discussed with patient certain preventive protocols, quality metrics, and best practice recommendations. A written personalized care plan for preventive services as well as general preventive health recommendations were provided to patient.     Varney Biles, LPN  [id number]/6759

## 2018-08-06 NOTE — Patient Instructions (Addendum)
  Mr. Knoth , Thank you for taking time to come for your Medicare Wellness Visit. I appreciate your ongoing commitment to your health goals. Please review the following plan we discussed and let me know if I can assist you in the future.   These are the goals we discussed: Goals      Patient Stated   . Weight (lb) < 200 lb (90.7 kg) (pt-stated)     Monitor carb intake  Stay hydrated Increase physical activity       This is a list of the screening recommended for you and due dates:  Health Maintenance  Topic Date Due  . Hemoglobin A1C  10/21/2018  . Complete foot exam   03/19/2019  . Urine Protein Check  04/23/2019  . Eye exam for diabetics  07/16/2019  . Tetanus Vaccine  06/30/2023  . Colon Cancer Screening  09/12/2027  . Flu Shot  Completed  .  Hepatitis C: One time screening is recommended by Center for Disease Control  (CDC) for  adults born from [birth year] through 1963.   Completed  . Pneumonia vaccines  Completed

## 2018-08-06 NOTE — Telephone Encounter (Signed)
Pt called to get results  Copied from [place] [phone]. Topic: Quick Communication - Lab Results (Clinic Use ONLY) >> Aug 06, 2018  4:22 PM Babs Bertin, CMA wrote: Called patient to inform them of [id number] lab results. When patient returns call, triage nurse may disclose results.

## 2018-08-12 DIAGNOSIS — J329 Chronic sinusitis, unspecified: Secondary | ICD-10-CM | POA: Diagnosis not present

## 2018-08-12 DIAGNOSIS — H6121 Impacted cerumen, right ear: Secondary | ICD-10-CM | POA: Diagnosis not present

## 2018-08-22 ENCOUNTER — Other Ambulatory Visit: Payer: Self-pay | Admitting: Internal Medicine

## 2018-08-24 DIAGNOSIS — D692 Other nonthrombocytopenic purpura: Secondary | ICD-10-CM | POA: Diagnosis not present

## 2018-08-24 DIAGNOSIS — L739 Follicular disorder, unspecified: Secondary | ICD-10-CM | POA: Diagnosis not present

## 2018-08-24 DIAGNOSIS — L570 Actinic keratosis: Secondary | ICD-10-CM | POA: Diagnosis not present

## 2018-08-24 DIAGNOSIS — Z1283 Encounter for screening for malignant neoplasm of skin: Secondary | ICD-10-CM | POA: Diagnosis not present

## 2018-08-24 DIAGNOSIS — L578 Other skin changes due to chronic exposure to nonionizing radiation: Secondary | ICD-10-CM | POA: Diagnosis not present

## 2018-08-24 DIAGNOSIS — L918 Other hypertrophic disorders of the skin: Secondary | ICD-10-CM | POA: Diagnosis not present

## 2018-08-24 DIAGNOSIS — L821 Other seborrheic keratosis: Secondary | ICD-10-CM | POA: Diagnosis not present

## 2018-08-29 ENCOUNTER — Other Ambulatory Visit: Payer: Self-pay | Admitting: Internal Medicine

## 2018-08-30 ENCOUNTER — Other Ambulatory Visit: Payer: Self-pay | Admitting: Internal Medicine

## 2018-08-30 ENCOUNTER — Encounter: Payer: Self-pay | Admitting: Internal Medicine

## 2018-08-30 MED ORDER — FREESTYLE LIBRE 14 DAY SENSOR MISC: 2.0000 | 6 refills | Status: DC

## 2018-08-30 NOTE — Telephone Encounter (Signed)
Copied from [place] [phone]. Topic: Quick Communication - Rx Refill/Question >> Aug 30, 2018  9:34 AM Burchel, Abbi R wrote: Medication: Continuous Blood Gluc Sensor (FREESTYLE LIBRE 14 DAY SENSOR) MISC   (90 day supply)  Preferred Pharmacy: [facility] [address] [place] [address] Phone: [phone] Fax: [phone]   Pt was advised that RX refills may take up to 3 business days. We ask that you follow-up with your pharmacy.

## 2018-09-09 ENCOUNTER — Ambulatory Visit: Payer: Self-pay | Admitting: *Deleted

## 2018-09-09 NOTE — Telephone Encounter (Signed)
Pt reports wrong reading from his [place] sensor. States read 258 about 25 minutes ago. Did fonger stick to verify, BS 130. Pt had already given himself 10u novalog per sliding scale. Scale calls for 6u but pt states "I have my own scale I go by. Pt asymptomatic. Pt ate candy bar, BS 126 (finger stick) 10 minutes later. Instructed pt to take 3 glucose tablets, BS 99 (finger stick). Instructed pt to take additional glucose tablet and drink soda. Pt remains asymptomatic. Wife is with patient. TN will CB for BS value. TN called practice and spoke with Butch Penny to alert of triage. Will route HP once additional value noted. . Called pt, BS by finger stick at 1520= 143. Remains asymptomatic. TN expressed concern regarding time frame of maximum effect in 1-3 hours. Recommended ED. Pt states he will monitor and if BS drops any lower will go to ED. Reviewed S/S hypoglycemia with pt. Verbalizes understanding.  CB # [id number]      Reason for Disposition . Diabetes drug error or overdose (e.g., insulin error or extra dose)  Answer Assessment - Initial Assessment Questions 1. SYMPTOMS: "What symptoms are you concerned about?"     *No Answer* 2. ONSET:  "When did the symptoms start?"     *No Answer* 3. BLOOD GLUCOSE: "What is your blood glucose level?"      *No Answer* 4. USUAL RANGE: "What is your blood glucose level usually?" (e.g., usual fasting morning value, usual evening value)     *No Answer* 5. TYPE 1 or 2:  "Do you know what type of diabetes you have?"  (e.g., Type 1, Type 2, Gestational; doesn't know)      *No Answer* 6. INSULIN: "Do you take insulin?" "What type of insulin(s) do you use? What is the mode of delivery? (syringe, pen (e.g., injection or  pump)      *No Answer* 7. DIABETES PILLS: "Do you take any pills for your diabetes?"     *No Answer* 8. OTHER SYMPTOMS: "Do you have any symptoms?" (e.g., fever, frequent urination, difficulty breathing, vomiting)     *No Answer* 9. LOW BLOOD  GLUCOSE TREATMENT: "What have you done so far to treat the low blood glucose level?"     *No Answer* 10. FOOD: "When did you last eat or drink?"       *No Answer* 11. ALONE: "Are you alone right now or is someone with you?"        *No Answer* 12. PREGNANCY: "Is there any chance you are pregnant?" "When was your last menstrual period?"       *No Answer*  Protocols used: DIABETES - LOW BLOOD SUGAR-A-AH

## 2018-09-09 NOTE — Telephone Encounter (Signed)
Also he can disc with endocrine upcoming appt   [place]

## 2018-09-09 NOTE — Telephone Encounter (Signed)
Patient stated his Elenor Legato meter is being different from finger stick.  Both are different. Patient is wondering what  He should be doing patient stated he will monitor and if any changes out of ordinary he is going to go to ED.

## 2018-09-09 NOTE — Telephone Encounter (Signed)
Low blood sugar is <70 with symptoms of dizzines, racing heart, sweatiness, confusion, shaky   These readings are not low from what I can see  If he is having <70 readings he can reduce lantus by 20% if 18 units then reduce to 15 units daily  And follow sliding scale for short acting insulin   appt with endocrine is 09/30/2018 continues to log sugar for this appt   [place]

## 2018-09-10 ENCOUNTER — Encounter: Payer: Self-pay | Admitting: Internal Medicine

## 2018-09-10 ENCOUNTER — Other Ambulatory Visit: Payer: Self-pay | Admitting: Internal Medicine

## 2018-09-10 MED ORDER — GLUCOSE BLOOD VI STRP: ORAL_STRIP | 3 refills | Status: DC

## 2018-10-28 DIAGNOSIS — J329 Chronic sinusitis, unspecified: Secondary | ICD-10-CM | POA: Diagnosis not present

## 2018-10-28 DIAGNOSIS — J301 Allergic rhinitis due to pollen: Secondary | ICD-10-CM | POA: Diagnosis not present

## 2018-10-29 DIAGNOSIS — J301 Allergic rhinitis due to pollen: Secondary | ICD-10-CM | POA: Diagnosis not present

## 2018-11-01 ENCOUNTER — Other Ambulatory Visit: Payer: Self-pay | Admitting: Internal Medicine

## 2018-11-01 ENCOUNTER — Encounter: Payer: Self-pay | Admitting: Internal Medicine

## 2018-11-01 DIAGNOSIS — E119 Type 2 diabetes mellitus without complications: Secondary | ICD-10-CM

## 2018-11-01 MED ORDER — METFORMIN HCL 500 MG PO TABS: ORAL_TABLET | ORAL | 3 refills | Status: DC

## 2018-11-01 MED ORDER — METFORMIN HCL 500 MG PO TABS: 500.0000 mg | ORAL_TABLET | Freq: Two times a day (BID) | ORAL | 3 refills | Status: DC

## 2018-11-12 DIAGNOSIS — K219 Gastro-esophageal reflux disease without esophagitis: Secondary | ICD-10-CM | POA: Diagnosis not present

## 2018-11-12 DIAGNOSIS — M1990 Unspecified osteoarthritis, unspecified site: Secondary | ICD-10-CM | POA: Diagnosis not present

## 2018-11-12 DIAGNOSIS — Z9849 Cataract extraction status, unspecified eye: Secondary | ICD-10-CM | POA: Diagnosis not present

## 2018-11-12 DIAGNOSIS — H42 Glaucoma in diseases classified elsewhere: Secondary | ICD-10-CM | POA: Diagnosis not present

## 2018-11-12 DIAGNOSIS — E663 Overweight: Secondary | ICD-10-CM | POA: Diagnosis not present

## 2018-11-23 ENCOUNTER — Other Ambulatory Visit: Payer: Self-pay | Admitting: Internal Medicine

## 2018-11-23 DIAGNOSIS — E119 Type 2 diabetes mellitus without complications: Secondary | ICD-10-CM

## 2018-11-23 MED ORDER — INSULIN GLARGINE 100 UNIT/ML SOLOSTAR PEN: 18.0000 [IU] | PEN_INJECTOR | Freq: Every day | SUBCUTANEOUS | 11 refills | Status: DC

## 2018-11-26 ENCOUNTER — Encounter: Payer: Self-pay | Admitting: Internal Medicine

## 2018-11-29 DIAGNOSIS — J324 Chronic pansinusitis: Secondary | ICD-10-CM | POA: Diagnosis not present

## 2018-11-29 DIAGNOSIS — J329 Chronic sinusitis, unspecified: Secondary | ICD-10-CM | POA: Diagnosis not present

## 2018-11-29 DIAGNOSIS — J301 Allergic rhinitis due to pollen: Secondary | ICD-10-CM | POA: Diagnosis not present

## 2018-12-31 DIAGNOSIS — Z794 Long term (current) use of insulin: Secondary | ICD-10-CM | POA: Diagnosis not present

## 2018-12-31 DIAGNOSIS — E119 Type 2 diabetes mellitus without complications: Secondary | ICD-10-CM | POA: Diagnosis not present

## 2019-01-04 ENCOUNTER — Other Ambulatory Visit: Payer: Self-pay | Admitting: Internal Medicine

## 2019-01-04 DIAGNOSIS — Z794 Long term (current) use of insulin: Secondary | ICD-10-CM

## 2019-01-04 DIAGNOSIS — E119 Type 2 diabetes mellitus without complications: Secondary | ICD-10-CM

## 2019-01-04 MED ORDER — LANTUS SOLOSTAR 100 UNIT/ML ~~LOC~~ SOPN: 24.0000 [IU] | PEN_INJECTOR | Freq: Every day | SUBCUTANEOUS | 11 refills | Status: DC

## 2019-01-19 ENCOUNTER — Other Ambulatory Visit: Payer: Self-pay

## 2019-01-20 ENCOUNTER — Ambulatory Visit (INDEPENDENT_AMBULATORY_CARE_PROVIDER_SITE_OTHER): Payer: PPO | Admitting: Family Medicine

## 2019-01-20 ENCOUNTER — Other Ambulatory Visit: Payer: Self-pay

## 2019-01-20 ENCOUNTER — Ambulatory Visit (INDEPENDENT_AMBULATORY_CARE_PROVIDER_SITE_OTHER): Payer: PPO

## 2019-01-20 ENCOUNTER — Encounter: Payer: Self-pay | Admitting: Family Medicine

## 2019-01-20 VITALS — BP 140/80 | HR 67 | Temp 98.3°F | Ht 70.5 in | Wt 212.0 lb

## 2019-01-20 DIAGNOSIS — K439 Ventral hernia without obstruction or gangrene: Secondary | ICD-10-CM | POA: Diagnosis not present

## 2019-01-20 DIAGNOSIS — M546 Pain in thoracic spine: Secondary | ICD-10-CM | POA: Diagnosis not present

## 2019-01-20 DIAGNOSIS — M549 Dorsalgia, unspecified: Secondary | ICD-10-CM | POA: Diagnosis not present

## 2019-01-20 DIAGNOSIS — M47814 Spondylosis without myelopathy or radiculopathy, thoracic region: Secondary | ICD-10-CM | POA: Diagnosis not present

## 2019-01-20 MED ORDER — HYDROCODONE-ACETAMINOPHEN 5-325 MG PO TABS: 1.0000 | ORAL_TABLET | Freq: Three times a day (TID) | ORAL | 0 refills | Status: DC | PRN

## 2019-01-20 MED ORDER — TIZANIDINE HCL 2 MG PO CAPS: 2.0000 mg | ORAL_CAPSULE | Freq: Three times a day (TID) | ORAL | 0 refills | Status: DC | PRN

## 2019-01-20 NOTE — Progress Notes (Signed)
Subjective:    Patient ID: Alexis Sharp, male    DOB: 10-Feb-1946, 73 y.o.   MRN: [id number]  HPI   Patient presents to clinic complaining of possible pulled muscle in abdomen or hernia and also right-sided upper back pain for 3 days.  States he noticed the area and abdomen when he was doing sit ups, states it patches out only when he is doing sit ups.  States area is not painful and when he is standing or sitting normally without exercising or bending forward it does not pouch out.  No vomiting, nausea or diarrhea.  Appetite normal.  Also has right-sided upper back pain.  Denies any known injury.  States pain is worse at night, would rate at 10 out of 10 at times & he cannot get comfortable.  States throughout the day the pain will subside and range anywhere from a 3 to a 5 out of 10.  Denies chest pain, palpitations, shortness of breath or wheezing.  Denies feeling faint or dizzy.    Patient Active Problem List   Diagnosis Date Noted  . Elevated blood pressure reading 08/06/2018  . Chronic sinusitis 08/06/2018  . Proteinuria 03/18/2018  . GERD (gastroesophageal reflux disease) 01/27/2018  . Allergic rhinitis 12/27/2017  . Post-nasal drip 12/27/2017  . Sensory loss 12/02/2016  . Diabetes mellitus type 2, controlled, without complications ([place]) 00/86/7619  . Reflux esophagitis 07/20/2014  . Advance directive discussed with patient 07/20/2014  . Routine general medical examination at a health care facility 12/25/2010  . MIGRAINE HEADACHE 11/01/2007  . Hyperlipemia 04/19/2007  . Unspecified glaucoma 04/19/2007   Social History   Tobacco Use  . Smoking status: Former Research scientist (life sciences)  . Smokeless tobacco: Never Used  . Tobacco comment: smoked some as teenager  Substance Use Topics  . Alcohol use: No    Alcohol/week: 0.0 standard drinks    Review of Systems  Constitutional: Negative for chills, fatigue and fever.  HENT: Negative for congestion, ear pain, sinus pain and sore throat.    Eyes: Negative.   Respiratory: Negative for cough, shortness of breath and wheezing.   Cardiovascular: Negative for chest pain, palpitations and leg swelling.  Gastrointestinal: Negative for abdominal pain, diarrhea, nausea and vomiting. ?hernia Genitourinary: Negative for dysuria, frequency and urgency.  Musculoskeletal: +right upper back pain for 3 days.  Skin: Negative for color change, pallor and rash.  Neurological: Negative for syncope, light-headedness and headaches.  Psychiatric/Behavioral: The patient is not nervous/anxious.       Objective:   Physical Exam Vitals signs and nursing note reviewed.  Constitutional:      General: He is not in acute distress.    Appearance: He is not toxic-appearing.  HENT:     Head: Normocephalic.  Eyes:     General: No scleral icterus.    Extraocular Movements: Extraocular movements intact.     Pupils: Pupils are equal, round, and reactive to light.  Neck:     Musculoskeletal: Normal range of motion and neck supple. No neck rigidity.  Cardiovascular:     Rate and Rhythm: Normal rate and regular rhythm.  Pulmonary:     Effort: Pulmonary effort is normal. No respiratory distress.     Breath sounds: Normal breath sounds. No wheezing, rhonchi or rales.  Abdominal:     General: Bowel sounds are normal. There is no distension.     Palpations: Abdomen is soft. There is no mass.     Tenderness: There is no abdominal tenderness. There is  no right CVA tenderness, left CVA tenderness, guarding or rebound.     Hernia: A hernia (small ventral hernia, only noticable when patient sits up from laying position. No sticking out when he is sitting. No pain. ) is present.  Musculoskeletal:       Back:     Right lower leg: No edema.     Left lower leg: No edema.     Comments: Point tenderness location indicated by red circle on diagram.  No spinous process tenderness. Able to bend side to side, twist side to side and forward/backward without issues  Grip strength equal and strong.   Skin:    General: Skin is warm and dry.  Neurological:     General: No focal deficit present.     Mental Status: He is alert and oriented to person, place, and time.     Gait: Gait normal.  Psychiatric:        Mood and Affect: Mood normal.        Behavior: Behavior normal.        Thought Content: Thought content normal.    Vitals:   01/20/19 1329  BP: 140/80  Pulse: 67  Temp: 98.3 F (36.8 C)  SpO2: 98%      Assessment & Plan:    A total of 25  minutes were spent face-to-face with the patient during this encounter and over half of that time was spent on counseling and coordination of care. The patient was counseled on ventral hernia is, signs to look out for also discussed possible causes of back pain and plan for work-up.   Ventral hernia - small ventral hernia present only when patient goes from lying to sitting position.  It is not painful and does not stick out when he is standing or sitting normally.  Advised patient just to monitor area for any increase in size or any pain and to call office right away if any of these occur.  This time the most likely would not do any sort of surgical intervention as the area is not severe.  Thoracic back pain - patient has point tenderness in T spine region of back.  We will get x-ray of the spine and chest to investigate further.  Advised to do stretches daily and use topical rub like BenGay or Biofreeze to help reduce pain.  We will also send a muscle relaxer to help reduce pain and patient request stronger pain medicine to have on hand if needed.  Advised to not take muscle relaxer and pain medication at the same time as it could be too much of a drowsiness side effect.  Advised to take only 1 or the other and see if pain relief happens.   We will base next step in plan of care off of x-ray results and also if topical rubs and muscle relaxer help pain.   Patient will otherwise keep regularly scheduled  follow-up with PCP as planned and return to clinic sooner if any issues arise.

## 2019-02-02 ENCOUNTER — Encounter: Payer: Self-pay | Admitting: Internal Medicine

## 2019-02-04 ENCOUNTER — Ambulatory Visit: Payer: PPO | Admitting: Internal Medicine

## 2019-02-08 DIAGNOSIS — H401113 Primary open-angle glaucoma, right eye, severe stage: Secondary | ICD-10-CM | POA: Diagnosis not present

## 2019-02-09 ENCOUNTER — Other Ambulatory Visit: Payer: Self-pay | Admitting: Internal Medicine

## 2019-02-09 DIAGNOSIS — E119 Type 2 diabetes mellitus without complications: Secondary | ICD-10-CM

## 2019-02-09 MED ORDER — FREESTYLE LIBRE 14 DAY SENSOR MISC: 2.0000 | 12 refills | Status: DC

## 2019-03-21 ENCOUNTER — Other Ambulatory Visit: Payer: Self-pay | Admitting: Gastroenterology

## 2019-03-21 NOTE — Telephone Encounter (Signed)
Patient called & would like to have a refill of omeprazole (PRILOSEC) 20 MG called into the [facility] on [address]. He has scheduled an appointment with Dr Vicente Males for 05-04-19.

## 2019-03-22 DIAGNOSIS — E663 Overweight: Secondary | ICD-10-CM | POA: Diagnosis not present

## 2019-03-22 DIAGNOSIS — Z23 Encounter for immunization: Secondary | ICD-10-CM | POA: Diagnosis not present

## 2019-03-22 DIAGNOSIS — K219 Gastro-esophageal reflux disease without esophagitis: Secondary | ICD-10-CM | POA: Diagnosis not present

## 2019-03-22 DIAGNOSIS — Z0000 Encounter for general adult medical examination without abnormal findings: Secondary | ICD-10-CM | POA: Diagnosis not present

## 2019-03-22 DIAGNOSIS — J329 Chronic sinusitis, unspecified: Secondary | ICD-10-CM | POA: Diagnosis not present

## 2019-03-22 DIAGNOSIS — Z008 Encounter for other general examination: Secondary | ICD-10-CM | POA: Diagnosis not present

## 2019-03-22 DIAGNOSIS — Z6829 Body mass index (BMI) 29.0-29.9, adult: Secondary | ICD-10-CM | POA: Diagnosis not present

## 2019-03-22 MED ORDER — OMEPRAZOLE 20 MG PO CPDR: 20.0000 mg | DELAYED_RELEASE_CAPSULE | Freq: Every day | ORAL | 1 refills | Status: DC

## 2019-03-22 NOTE — Telephone Encounter (Signed)
Refilled the mediation

## 2019-03-29 ENCOUNTER — Other Ambulatory Visit: Payer: Self-pay | Admitting: Internal Medicine

## 2019-03-29 ENCOUNTER — Telehealth: Payer: Self-pay | Admitting: Internal Medicine

## 2019-03-29 DIAGNOSIS — E11649 Type 2 diabetes mellitus with hypoglycemia without coma: Secondary | ICD-10-CM

## 2019-03-29 DIAGNOSIS — Z794 Long term (current) use of insulin: Secondary | ICD-10-CM

## 2019-03-29 DIAGNOSIS — E119 Type 2 diabetes mellitus without complications: Secondary | ICD-10-CM

## 2019-03-29 MED ORDER — LANTUS SOLOSTAR 100 UNIT/ML ~~LOC~~ SOPN: 24.0000 [IU] | PEN_INJECTOR | Freq: Every day | SUBCUTANEOUS | 11 refills | Status: DC

## 2019-03-29 NOTE — Telephone Encounter (Signed)
°  Relation to pt: self  Call back number: [phone] Pharmacy:  [place], [place] [phone] (Phone) [phone] (Fax)    Reason for call:  Patient requesting Insulin Glargine (LANTUS SOLOSTAR) 100 UNIT/ML Solostar Pen, informed please allow 48 to 72 hour turn around time

## 2019-03-30 MED ORDER — LANTUS SOLOSTAR 100 UNIT/ML ~~LOC~~ SOPN: 24.0000 [IU] | PEN_INJECTOR | Freq: Every day | SUBCUTANEOUS | 11 refills | Status: DC

## 2019-03-30 NOTE — Addendum Note (Signed)
Addended by: Orland Mustard on: 03/30/2019 08:21 AM   Modules accepted: Orders

## 2019-04-03 ENCOUNTER — Other Ambulatory Visit: Payer: Self-pay | Admitting: Internal Medicine

## 2019-04-03 DIAGNOSIS — Z794 Long term (current) use of insulin: Secondary | ICD-10-CM

## 2019-04-03 DIAGNOSIS — E119 Type 2 diabetes mellitus without complications: Secondary | ICD-10-CM

## 2019-04-05 DIAGNOSIS — Z794 Long term (current) use of insulin: Secondary | ICD-10-CM | POA: Diagnosis not present

## 2019-04-05 DIAGNOSIS — E119 Type 2 diabetes mellitus without complications: Secondary | ICD-10-CM | POA: Diagnosis not present

## 2019-04-08 DIAGNOSIS — J329 Chronic sinusitis, unspecified: Secondary | ICD-10-CM | POA: Diagnosis not present

## 2019-04-08 DIAGNOSIS — K219 Gastro-esophageal reflux disease without esophagitis: Secondary | ICD-10-CM | POA: Diagnosis not present

## 2019-04-08 DIAGNOSIS — J324 Chronic pansinusitis: Secondary | ICD-10-CM | POA: Diagnosis not present

## 2019-04-12 DIAGNOSIS — E119 Type 2 diabetes mellitus without complications: Secondary | ICD-10-CM | POA: Diagnosis not present

## 2019-04-12 DIAGNOSIS — Z794 Long term (current) use of insulin: Secondary | ICD-10-CM | POA: Diagnosis not present

## 2019-05-04 ENCOUNTER — Ambulatory Visit: Payer: PPO | Admitting: Gastroenterology

## 2019-05-04 DIAGNOSIS — K2100 Gastro-esophageal reflux disease with esophagitis, without bleeding: Secondary | ICD-10-CM | POA: Diagnosis not present

## 2019-05-04 DIAGNOSIS — E119 Type 2 diabetes mellitus without complications: Secondary | ICD-10-CM | POA: Diagnosis not present

## 2019-05-04 DIAGNOSIS — R05 Cough: Secondary | ICD-10-CM | POA: Diagnosis not present

## 2019-05-06 DIAGNOSIS — J329 Chronic sinusitis, unspecified: Secondary | ICD-10-CM | POA: Diagnosis not present

## 2019-05-24 ENCOUNTER — Other Ambulatory Visit: Payer: Self-pay | Admitting: Gastroenterology

## 2019-05-24 DIAGNOSIS — K2100 Gastro-esophageal reflux disease with esophagitis, without bleeding: Secondary | ICD-10-CM

## 2019-05-31 ENCOUNTER — Other Ambulatory Visit: Payer: Self-pay

## 2019-05-31 ENCOUNTER — Other Ambulatory Visit: Payer: Self-pay | Admitting: Internal Medicine

## 2019-05-31 ENCOUNTER — Ambulatory Visit
Admission: RE | Admit: 2019-05-31 | Discharge: 2019-05-31 | Disposition: A | Discharge status: Home / Self Care | Payer: PPO | Source: Ambulatory Visit | Attending: Gastroenterology | Admitting: Gastroenterology

## 2019-05-31 DIAGNOSIS — K449 Diaphragmatic hernia without obstruction or gangrene: Secondary | ICD-10-CM | POA: Diagnosis not present

## 2019-05-31 DIAGNOSIS — K2100 Gastro-esophageal reflux disease with esophagitis, without bleeding: Secondary | ICD-10-CM | POA: Diagnosis not present

## 2019-05-31 DIAGNOSIS — K219 Gastro-esophageal reflux disease without esophagitis: Secondary | ICD-10-CM | POA: Diagnosis not present

## 2019-06-03 DIAGNOSIS — L820 Inflamed seborrheic keratosis: Secondary | ICD-10-CM | POA: Diagnosis not present

## 2019-06-03 DIAGNOSIS — L578 Other skin changes due to chronic exposure to nonionizing radiation: Secondary | ICD-10-CM | POA: Diagnosis not present

## 2019-06-03 DIAGNOSIS — L570 Actinic keratosis: Secondary | ICD-10-CM | POA: Diagnosis not present

## 2019-06-14 ENCOUNTER — Other Ambulatory Visit: Payer: Self-pay | Admitting: Internal Medicine

## 2019-06-14 DIAGNOSIS — Z794 Long term (current) use of insulin: Secondary | ICD-10-CM

## 2019-06-14 DIAGNOSIS — E119 Type 2 diabetes mellitus without complications: Secondary | ICD-10-CM

## 2019-06-14 MED ORDER — GLUCOSE BLOOD VI STRP: ORAL_STRIP | 3 refills | Status: DC

## 2019-06-21 DIAGNOSIS — Z79899 Other long term (current) drug therapy: Secondary | ICD-10-CM | POA: Diagnosis not present

## 2019-06-21 DIAGNOSIS — J329 Chronic sinusitis, unspecified: Secondary | ICD-10-CM | POA: Diagnosis not present

## 2019-06-21 DIAGNOSIS — E669 Obesity, unspecified: Secondary | ICD-10-CM | POA: Diagnosis not present

## 2019-06-21 DIAGNOSIS — Z0001 Encounter for general adult medical examination with abnormal findings: Secondary | ICD-10-CM | POA: Diagnosis not present

## 2019-06-21 DIAGNOSIS — E1169 Type 2 diabetes mellitus with other specified complication: Secondary | ICD-10-CM | POA: Diagnosis not present

## 2019-06-21 DIAGNOSIS — Z0000 Encounter for general adult medical examination without abnormal findings: Secondary | ICD-10-CM | POA: Diagnosis not present

## 2019-06-21 DIAGNOSIS — Z6830 Body mass index (BMI) 30.0-30.9, adult: Secondary | ICD-10-CM | POA: Diagnosis not present

## 2019-06-21 DIAGNOSIS — J309 Allergic rhinitis, unspecified: Secondary | ICD-10-CM | POA: Diagnosis not present

## 2019-06-21 DIAGNOSIS — K219 Gastro-esophageal reflux disease without esophagitis: Secondary | ICD-10-CM | POA: Diagnosis not present

## 2019-06-21 DIAGNOSIS — H409 Unspecified glaucoma: Secondary | ICD-10-CM | POA: Diagnosis not present

## 2019-07-03 ENCOUNTER — Encounter: Payer: Self-pay | Admitting: Internal Medicine

## 2019-07-04 ENCOUNTER — Encounter: Payer: Self-pay | Admitting: Internal Medicine

## 2019-07-13 DIAGNOSIS — R05 Cough: Secondary | ICD-10-CM | POA: Diagnosis not present

## 2019-08-10 ENCOUNTER — Ambulatory Visit: Payer: PPO | Admitting: Internal Medicine

## 2019-08-10 ENCOUNTER — Ambulatory Visit: Payer: PPO

## 2019-08-10 DIAGNOSIS — H401133 Primary open-angle glaucoma, bilateral, severe stage: Secondary | ICD-10-CM | POA: Diagnosis not present

## 2019-08-10 LAB — HM DIABETES EYE EXAM

## 2019-08-11 ENCOUNTER — Encounter: Payer: Self-pay | Admitting: Internal Medicine

## 2019-08-26 DIAGNOSIS — H401133 Primary open-angle glaucoma, bilateral, severe stage: Secondary | ICD-10-CM | POA: Diagnosis not present

## 2019-09-23 DIAGNOSIS — Z20822 Contact with and (suspected) exposure to covid-19: Secondary | ICD-10-CM | POA: Diagnosis not present

## 2019-09-23 DIAGNOSIS — Z01812 Encounter for preprocedural laboratory examination: Secondary | ICD-10-CM | POA: Diagnosis not present

## 2019-09-27 DIAGNOSIS — H401123 Primary open-angle glaucoma, left eye, severe stage: Secondary | ICD-10-CM | POA: Diagnosis not present

## 2019-10-01 DIAGNOSIS — R350 Frequency of micturition: Secondary | ICD-10-CM | POA: Diagnosis not present

## 2019-10-01 DIAGNOSIS — R81 Glycosuria: Secondary | ICD-10-CM | POA: Diagnosis not present

## 2019-10-03 ENCOUNTER — Telehealth: Payer: Self-pay

## 2019-10-03 NOTE — Telephone Encounter (Signed)
Pt left VM on triage line at 0653 on 4/18 reporting he was having UTI symptoms. VM was not retrieved until Monday morning. Contacted pt who reports he was seen at a walk in clinic and he was treated for UTI. Pt denied any further needs.

## 2019-10-03 NOTE — Telephone Encounter (Signed)
Noted our office if closed 10/02/19 so walk in is appropriate   [place]

## 2019-10-21 DIAGNOSIS — H401113 Primary open-angle glaucoma, right eye, severe stage: Secondary | ICD-10-CM | POA: Diagnosis not present

## 2019-11-03 DIAGNOSIS — Z03818 Encounter for observation for suspected exposure to other biological agents ruled out: Secondary | ICD-10-CM | POA: Diagnosis not present

## 2019-11-03 DIAGNOSIS — J301 Allergic rhinitis due to pollen: Secondary | ICD-10-CM | POA: Diagnosis not present

## 2019-11-03 DIAGNOSIS — J329 Chronic sinusitis, unspecified: Secondary | ICD-10-CM | POA: Diagnosis not present

## 2019-11-03 DIAGNOSIS — Z20822 Contact with and (suspected) exposure to covid-19: Secondary | ICD-10-CM | POA: Diagnosis not present

## 2019-11-04 DIAGNOSIS — E119 Type 2 diabetes mellitus without complications: Secondary | ICD-10-CM | POA: Diagnosis not present

## 2019-11-07 IMAGING — CT CT MAXILLOFACIAL W/O CM
3 of 4 series · 12 of 47 positions shown, 14 images · non-contrast
Comparison: None.

CLINICAL DATA: Productive cough. Sinus drainage. Unable to sleep
because of drainage.

EXAM:
CT MAXILLOFACIAL WITHOUT CONTRAST
TECHNIQUE: Multidetector CT images of the paranasal sinuses were obtained using
the standard protocol without intravenous contrast.

[Series 2: sinus · axial · 0.28mm/px · z∈[-564,-464]mm · 6 of 72 slices shown, 8 images (1 of 3)]
[im 11/72  brain]
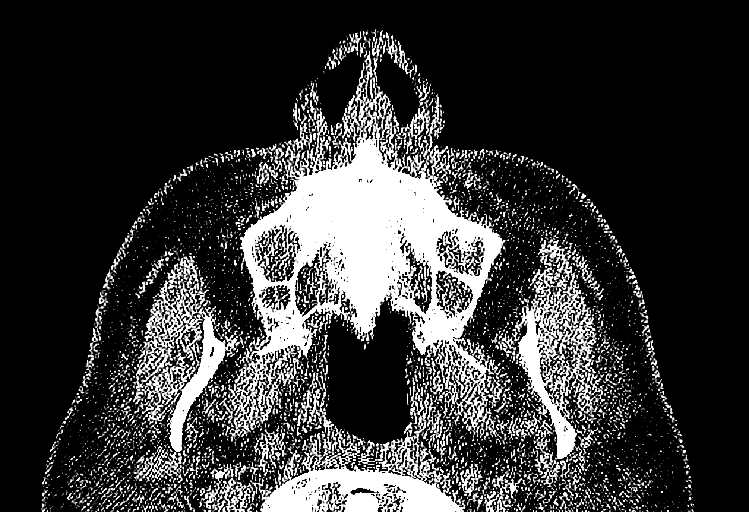
[im 11/72  bone]
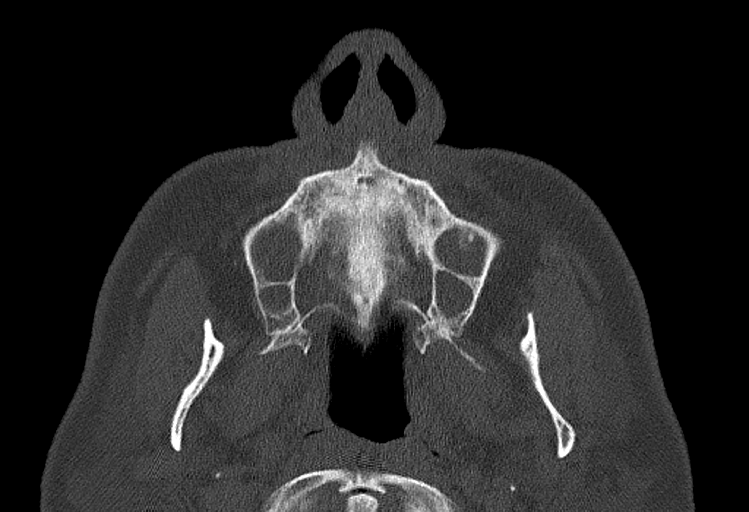
[im 21/72  bone]
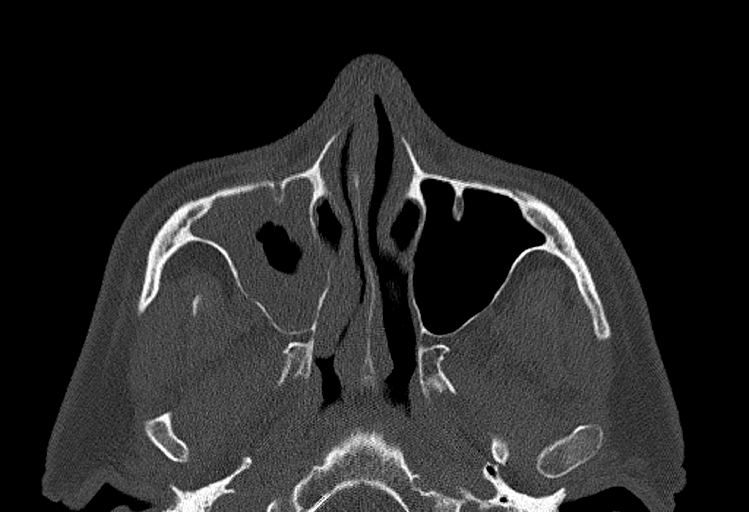
[im 31/72  bone]
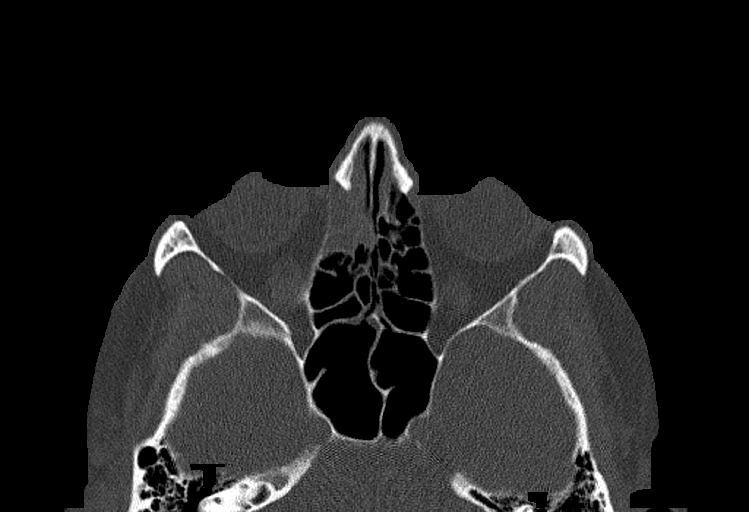
[im 41/72  bone]
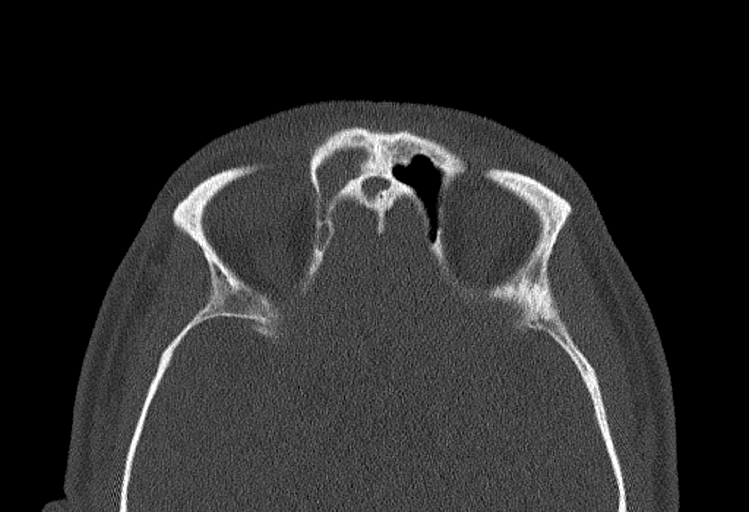
[im 51/72  brain]
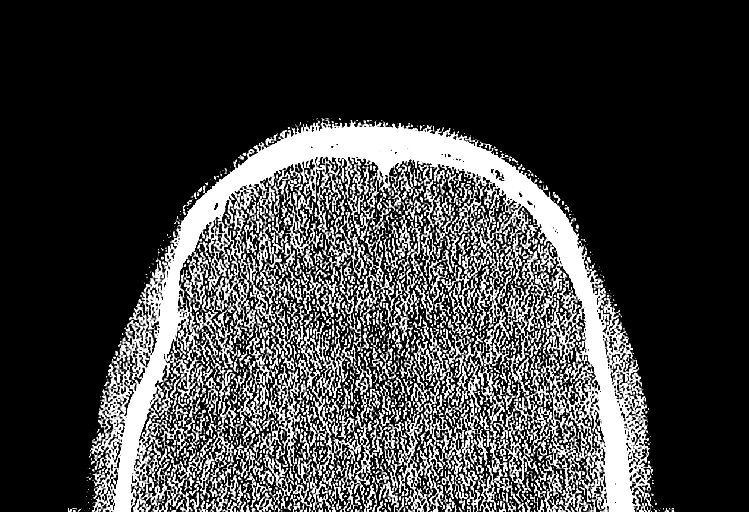
[im 51/72  bone]
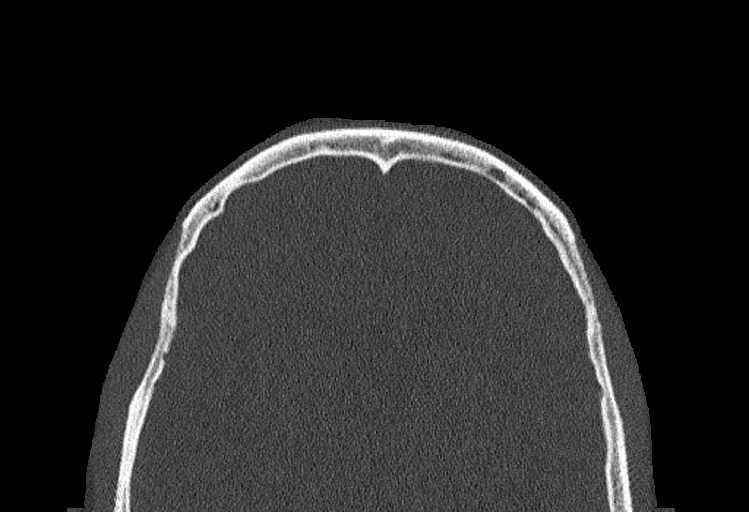
[im 61/72  bone]
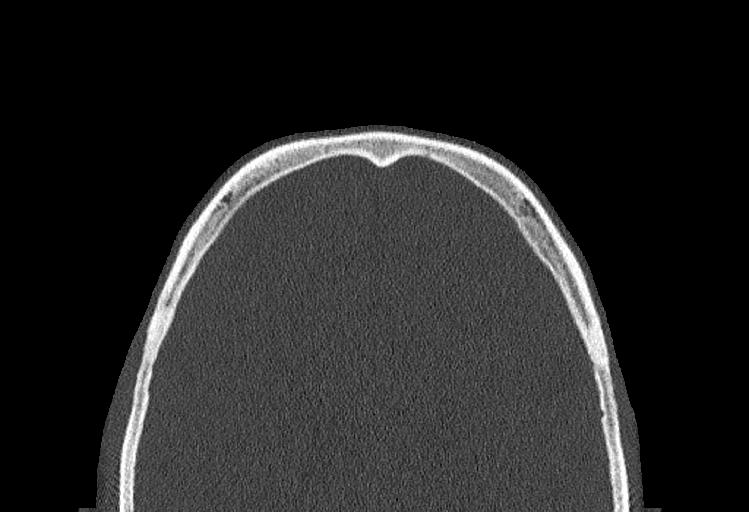

[Series 6: sinus · coronal · 0.28mm/px · 3 of 70 slices shown (2 of 3)]
[im 24/70  bone]
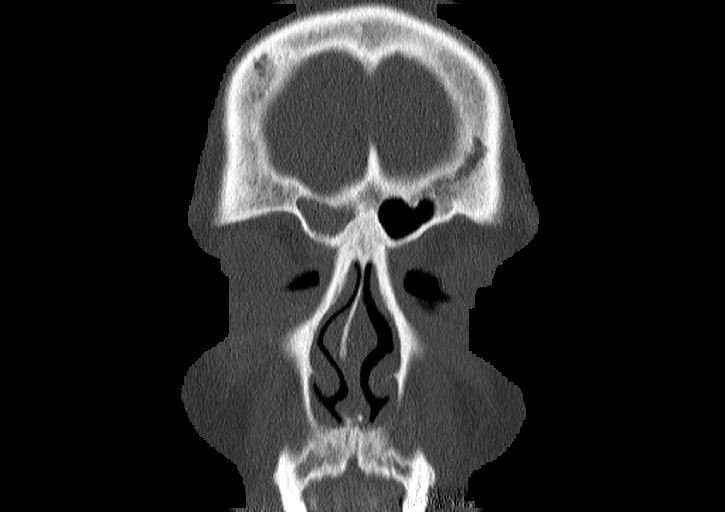
[im 31/70  bone]
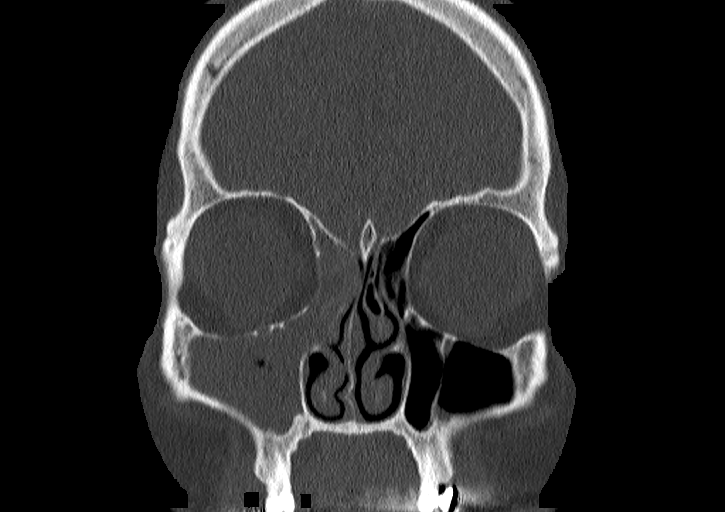
[im 39/70  bone]
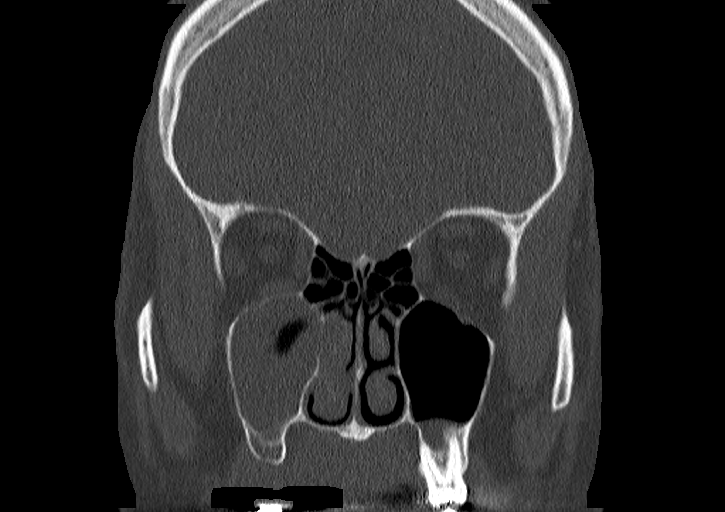

[Series 8: sinus · sagittal · 0.28mm/px · 3 of 102 slices shown (3 of 3)]
[im 34/102  bone]
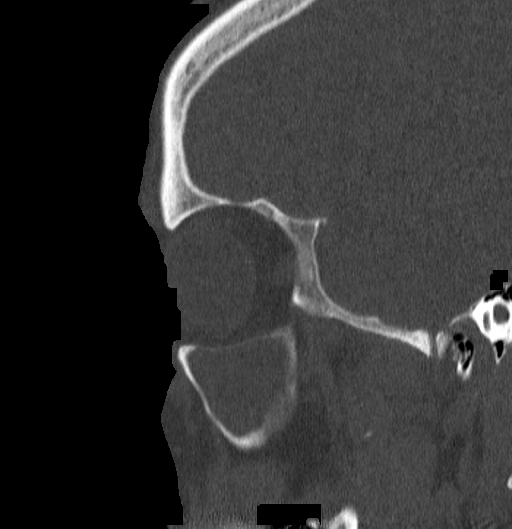
[im 51/102  bone]
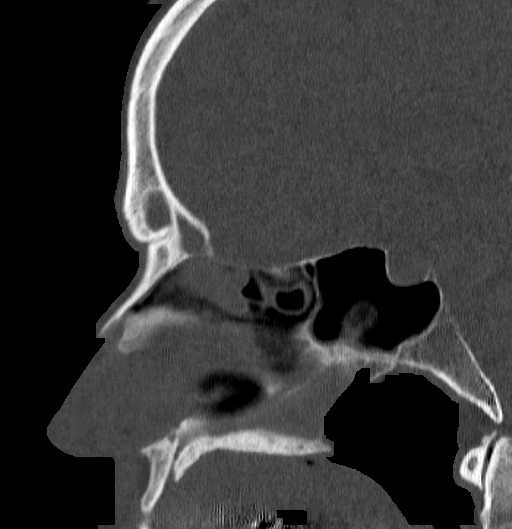
[im 68/102  bone]
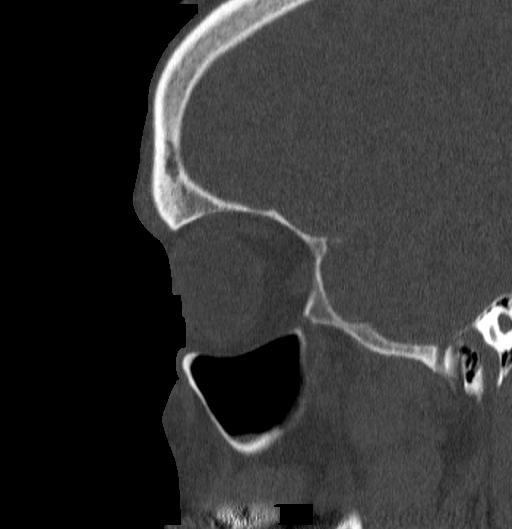

[12 of 47 positions shown; findings below may reference images not displayed]

FINDINGS: Paranasal sinuses:

Frontal: Left frontal sinus is clear. Right frontal sinus is nearly
completely opacified.

Ethmoid: Left ethmoid sinuses are clear. Complete opacification of
the anterior ethmoid sinuses on the right.

Maxillary: Left maxillary sinus is clear. Near complete
opacification of the right maxillary sinus.

Sphenoid: Normal

Right ostiomeatal unit: Completely obscured by inflammatory change.

Left ostiomeatal unit: Infundibulum is patent. Small adjacent Haller
cells.

Nasal passages: Opacification of the upper nasal passages on the
right. Lower right nasal passages are patent. Left nasal passages
are entirely patent. Septum bows 2 mm towards the right.

Anatomy: No pneumatization superior to anterior ethmoid notches.
Symmetric and intact olfactory grooves and fovea ethmoidalis, Keros
I (1-3mm). Sellar sphenoid pneumatization pattern. No dehiscence of
carotid or optic canals. No onodi cell.

Other: None
IMPRESSION: Ostiomeatal complex obstructive pattern on the right with
opacification of the right maxillary sinus, right anterior ethmoid
sinuses and right frontal sinus.

## 2019-12-13 ENCOUNTER — Other Ambulatory Visit: Payer: Self-pay

## 2019-12-13 ENCOUNTER — Ambulatory Visit: Payer: PPO | Admitting: Dermatology

## 2019-12-13 DIAGNOSIS — L570 Actinic keratosis: Secondary | ICD-10-CM

## 2019-12-13 DIAGNOSIS — D692 Other nonthrombocytopenic purpura: Secondary | ICD-10-CM

## 2019-12-13 DIAGNOSIS — L738 Other specified follicular disorders: Secondary | ICD-10-CM

## 2019-12-13 DIAGNOSIS — Z1283 Encounter for screening for malignant neoplasm of skin: Secondary | ICD-10-CM | POA: Diagnosis not present

## 2019-12-13 DIAGNOSIS — L820 Inflamed seborrheic keratosis: Secondary | ICD-10-CM

## 2019-12-13 DIAGNOSIS — L821 Other seborrheic keratosis: Secondary | ICD-10-CM | POA: Diagnosis not present

## 2019-12-13 DIAGNOSIS — L578 Other skin changes due to chronic exposure to nonionizing radiation: Secondary | ICD-10-CM | POA: Diagnosis not present

## 2019-12-13 DIAGNOSIS — L814 Other melanin hyperpigmentation: Secondary | ICD-10-CM | POA: Diagnosis not present

## 2019-12-13 DIAGNOSIS — D1800 Hemangioma unspecified site: Secondary | ICD-10-CM | POA: Diagnosis not present

## 2019-12-13 DIAGNOSIS — Z808 Family history of malignant neoplasm of other organs or systems: Secondary | ICD-10-CM

## 2019-12-13 DIAGNOSIS — Z86018 Personal history of other benign neoplasm: Secondary | ICD-10-CM | POA: Diagnosis not present

## 2019-12-13 DIAGNOSIS — D239 Other benign neoplasm of skin, unspecified: Secondary | ICD-10-CM

## 2019-12-13 NOTE — Progress Notes (Signed)
   Follow-Up Visit   Subjective  Alexis Sharp is a 74 y.o. male who presents for the following: Follow-up.  Patient here today for a 6 month follow up. ISK at left temple (cleared up) treated at last visit and AK vs ISK treated at bilateral temples and right postauricular with LN2. He has a history of AK's and an older brother who had a melanoma. Nothing new or changing that patient is aware of. 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  UBSE today  The following portions of the chart were reviewed this encounter and updated as appropriate:      Review of Systems:  No other skin or systemic complaints except as noted in HPI or Assessment and Plan.  Objective  Well appearing patient in no apparent distress; mood and affect are within normal limits.  All skin waist up examined.  Objective  Mid Sternum: Linear white scar- clear   Objective  Bilateral arms: Violaceous macules and patches.   Objective  Face: Yellow papules  Objective  L Temple x 3, L mandible x 1, R cheek x 1, R forehead x 1, R antihelix x 1 (7): Erythematous thin papules/macules with gritty scale.   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 Excised: 02/25/2016  No recurrence  Senile purpura (HCC) Bilateral arms  Benign, observe.    Sebaceous hyperplasia Face  Benign, observe.    AK (actinic keratosis) (7) L Temple x 3, L mandible x 1, R cheek x 1, R forehead x 1, R antihelix x 1  Discussed PDT to face in fall, info given  Destruction of lesion - L Temple x 3, L mandible x 1, R cheek x 1, R forehead x 1, R antihelix x 1  Destruction method: cryotherapy   Informed consent: discussed and consent obtained   Lesion destroyed using liquid nitrogen: Yes   Region frozen until ice ball extended beyond lesion: Yes    Outcome: patient tolerated procedure well with no complications   Post-procedure details: wound care instructions given    Inflamed seborrheic keratosis Right Lower Leg  Vs Nummular Derm  Continue TMC 0.1% cream to AA's 1-2x daily PRN. Avoid F/G/A. Moisturize daily.   Actinic Damage - diffuse scaly erythematous macules with underlying dyspigmentation - Recommend daily broad spectrum sunscreen SPF 30+ to sun-exposed areas, reapply every 2 hours as needed.  - Call for new or changing lesions.  Seborrheic Keratoses - Stuck-on, waxy, tan-brown papules and plaques  - Discussed benign etiology and prognosis. - Observe - Call for any changes  Lentigines - Scattered tan macules - Discussed due to sun exposure - Benign, observe - Call for any changes  Hemangiomas - Red papules - Discussed benign nature - Observe - Call for any changes Skin cancer screening performed today.  Return in about 6 months (around 06/13/2020) for AK follow up.  Graciella Belton, RMA, am acting as scribe for Brendolyn Patty, MD .  Documentation: I have reviewed the above documentation for accuracy and completeness, and I agree with the above.  Brendolyn Patty MD

## 2019-12-13 NOTE — Patient Instructions (Addendum)
Recommend daily broad spectrum sunscreen SPF 30+ to sun-exposed areas, reapply every 2 hours as needed. Call for new or changing lesions.  Cryotherapy Aftercare  . Wash gently with soap and water everyday.   Marland Kitchen Apply Vaseline and Band-Aid daily until healed.  Continue triamcinolone 0.1% cream to affected areas lower legs 1-2x daily as needed until clear. Avoid face, groin, underarms.  Moisturize daily.

## 2019-12-23 DIAGNOSIS — H401133 Primary open-angle glaucoma, bilateral, severe stage: Secondary | ICD-10-CM | POA: Diagnosis not present

## 2020-01-02 ENCOUNTER — Telehealth: Payer: Self-pay | Admitting: Internal Medicine

## 2020-01-02 DIAGNOSIS — E119 Type 2 diabetes mellitus without complications: Secondary | ICD-10-CM

## 2020-01-02 MED ORDER — FREESTYLE LIBRE 14 DAY SENSOR MISC: 2.0000 | 12 refills | Status: DC

## 2020-01-02 NOTE — Telephone Encounter (Signed)
Pt called in stated that he need refill on Continuous Blood Gluc Sensor (FREESTYLE LIBRE 14 DAY SENSOR) MISC  Only have 3 left

## 2020-01-03 ENCOUNTER — Other Ambulatory Visit: Payer: Self-pay | Admitting: Internal Medicine

## 2020-01-03 DIAGNOSIS — Z974 Presence of external hearing-aid: Secondary | ICD-10-CM | POA: Diagnosis not present

## 2020-01-03 DIAGNOSIS — E119 Type 2 diabetes mellitus without complications: Secondary | ICD-10-CM

## 2020-01-03 DIAGNOSIS — J324 Chronic pansinusitis: Secondary | ICD-10-CM | POA: Diagnosis not present

## 2020-01-03 DIAGNOSIS — J343 Hypertrophy of nasal turbinates: Secondary | ICD-10-CM | POA: Diagnosis not present

## 2020-01-03 MED ORDER — METFORMIN HCL 500 MG PO TABS: ORAL_TABLET | ORAL | 1 refills | Status: DC

## 2020-01-03 MED ORDER — FREESTYLE LIBRE 14 DAY SENSOR MISC: 2.0000 | 12 refills | Status: DC

## 2020-01-03 NOTE — Addendum Note (Signed)
Addended by: Elpidio Galea T on: 01/03/2020 03:19 PM   Modules accepted: Orders

## 2020-01-03 NOTE — Telephone Encounter (Signed)
Pt called back to say that  He needed 6 units only got 3

## 2020-01-04 DIAGNOSIS — J324 Chronic pansinusitis: Secondary | ICD-10-CM | POA: Diagnosis not present

## 2020-01-10 ENCOUNTER — Telehealth: Payer: Self-pay | Admitting: Internal Medicine

## 2020-01-10 ENCOUNTER — Other Ambulatory Visit: Payer: Self-pay

## 2020-01-10 DIAGNOSIS — E119 Type 2 diabetes mellitus without complications: Secondary | ICD-10-CM

## 2020-01-10 MED ORDER — LANTUS SOLOSTAR 100 UNIT/ML ~~LOC~~ SOPN: 24.0000 [IU] | PEN_INJECTOR | Freq: Every day | SUBCUTANEOUS | 11 refills | Status: DC

## 2020-01-10 NOTE — Telephone Encounter (Signed)
Pt wants 90 day supply of Insulin Glargine (LANTUS SOLOSTAR) 100 UNIT/ML Solostar Pen to [facility]

## 2020-01-11 ENCOUNTER — Other Ambulatory Visit: Payer: Self-pay | Admitting: Internal Medicine

## 2020-01-11 DIAGNOSIS — Z794 Long term (current) use of insulin: Secondary | ICD-10-CM

## 2020-01-11 MED ORDER — LANTUS SOLOSTAR 100 UNIT/ML ~~LOC~~ SOPN: 18.0000 [IU] | PEN_INJECTOR | Freq: Every day | SUBCUTANEOUS | 3 refills | Status: DC

## 2020-01-13 ENCOUNTER — Telehealth: Payer: Self-pay | Admitting: Internal Medicine

## 2020-01-13 NOTE — Telephone Encounter (Signed)
Faxed clarification for freestyle libre sensor 14D kit to [facility]

## 2020-01-17 ENCOUNTER — Telehealth: Payer: Self-pay | Admitting: Internal Medicine

## 2020-01-17 NOTE — Telephone Encounter (Signed)
Hydroxychloroquine is not the treatment for covid prevention  The vaccine  This is not a recommended treatment either  Will not Rx this medication

## 2020-01-17 NOTE — Telephone Encounter (Signed)
Tessie Fass D "KATHY"  McLean-Scocuzza, Nino Glow, MD 2 days ago   Hi Dr .Aundra Dubin I was wondering if you could send Graciano some  hydrochloraquin . He is going on a month trip with his brother .  With all the crazy messaging going on about the vaccine and the efficacy of it and that the variant may be caused by vaccinated people . We want to be covered in case he does get sick on this trip . He is fully vaccinated but is a diabetic.  So we are requesting this prescription . For a safety net and backup while traveling all over . He will of course take precautions but I couldn't live with myself if he got sick on the road snd I couldn't see him ir be there to  Help.   Please consider equipping him with this safety net .  Juliann Pulse

## 2020-01-18 NOTE — Telephone Encounter (Signed)
Patient informed and verbalized understanding

## 2020-01-20 ENCOUNTER — Encounter: Payer: Self-pay | Admitting: Internal Medicine

## 2020-01-20 ENCOUNTER — Other Ambulatory Visit: Payer: Self-pay

## 2020-01-20 ENCOUNTER — Ambulatory Visit (INDEPENDENT_AMBULATORY_CARE_PROVIDER_SITE_OTHER): Payer: PPO | Admitting: Internal Medicine

## 2020-01-20 VITALS — BP 148/100 | HR 61 | Temp 98.0°F | Ht 70.51 in | Wt 207.4 lb

## 2020-01-20 DIAGNOSIS — H409 Unspecified glaucoma: Secondary | ICD-10-CM | POA: Diagnosis not present

## 2020-01-20 DIAGNOSIS — Z794 Long term (current) use of insulin: Secondary | ICD-10-CM | POA: Diagnosis not present

## 2020-01-20 DIAGNOSIS — E663 Overweight: Secondary | ICD-10-CM | POA: Diagnosis not present

## 2020-01-20 DIAGNOSIS — E1159 Type 2 diabetes mellitus with other circulatory complications: Secondary | ICD-10-CM | POA: Diagnosis not present

## 2020-01-20 DIAGNOSIS — Z125 Encounter for screening for malignant neoplasm of prostate: Secondary | ICD-10-CM | POA: Diagnosis not present

## 2020-01-20 DIAGNOSIS — I10 Essential (primary) hypertension: Secondary | ICD-10-CM

## 2020-01-20 DIAGNOSIS — G720 Drug-induced myopathy: Secondary | ICD-10-CM | POA: Diagnosis not present

## 2020-01-20 DIAGNOSIS — I152 Hypertension secondary to endocrine disorders: Secondary | ICD-10-CM | POA: Insufficient documentation

## 2020-01-20 DIAGNOSIS — E119 Type 2 diabetes mellitus without complications: Secondary | ICD-10-CM | POA: Diagnosis not present

## 2020-01-20 DIAGNOSIS — H401133 Primary open-angle glaucoma, bilateral, severe stage: Secondary | ICD-10-CM | POA: Diagnosis not present

## 2020-01-20 DIAGNOSIS — Z1329 Encounter for screening for other suspected endocrine disorder: Secondary | ICD-10-CM | POA: Diagnosis not present

## 2020-01-20 DIAGNOSIS — T466X5A Adverse effect of antihyperlipidemic and antiarteriosclerotic drugs, initial encounter: Secondary | ICD-10-CM

## 2020-01-20 MED ORDER — EMPAGLIFLOZIN 25 MG PO TABS: 25.0000 mg | ORAL_TABLET | Freq: Every day | ORAL | 3 refills | Status: DC

## 2020-01-20 MED ORDER — LOSARTAN POTASSIUM 25 MG PO TABS: 25.0000 mg | ORAL_TABLET | Freq: Every day | ORAL | 3 refills | Status: DC

## 2020-01-20 MED ORDER — LANTUS SOLOSTAR 100 UNIT/ML ~~LOC~~ SOPN: 18.0000 [IU] | PEN_INJECTOR | Freq: Every day | SUBCUTANEOUS | 12 refills | Status: DC

## 2020-01-20 MED ORDER — OMEPRAZOLE 20 MG PO CPDR: 20.0000 mg | DELAYED_RELEASE_CAPSULE | Freq: Every day | ORAL | 3 refills | Status: DC

## 2020-01-20 MED ORDER — FREESTYLE LIBRE 14 DAY SENSOR MISC: 2.0000 | 11 refills | Status: DC

## 2020-01-20 MED ORDER — LANTUS SOLOSTAR 100 UNIT/ML ~~LOC~~ SOPN: 20.0000 [IU] | PEN_INJECTOR | Freq: Every day | SUBCUTANEOUS | 12 refills | Status: DC

## 2020-01-20 NOTE — Patient Instructions (Addendum)
Goal blood pressure <130/<80 please monitor your blood pressure  Consider lovastatin or pravachol  -read below   Simply Lemonade LIGHT    Losartan Tablets What is this medicine? LOSARTAN (loe SAR tan) is an angiotensin II receptor blocker, also known as an ARB. It treats high blood pressure. It can slow kidney damage in some patients. It may also be used to lower the risk of stroke. This medicine may be used for other purposes; ask your health care provider or pharmacist if you have questions. COMMON BRAND NAME(S): Cozaar What should I tell my health care provider before I take this medicine? They need to know if you have any of these conditions:  heart failure  kidney or liver disease  an unusual or allergic reaction to losartan, other medicines, foods, dyes, or preservatives  pregnant or trying to get pregnant  breast-feeding How should I use this medicine? Take this drug by mouth. Take it as directed on the prescription label at the same time every day. You can take it with or without food. If it upsets your stomach, take it with food. Keep taking it unless your health care provider tells you to stop. Talk to your health care provider about the use of this drug in children. While it may be prescribed for children as young as 6 for selected conditions, precautions do apply. Overdosage: If you think you have taken too much of this medicine contact a poison control center or emergency room at once. NOTE: This medicine is only for you. Do not share this medicine with others. What if I miss a dose? If you miss a dose, take it as soon as you can. If it is almost time for your next dose, take only that dose. Do not take double or extra doses. What may interact with this medicine?  blood pressure medicines  diuretics, especially triamterene, spironolactone, or amiloride  fluconazole  NSAIDs, medicines for pain and inflammation, like ibuprofen or naproxen  potassium salts or  potassium supplements  rifampin This list may not describe all possible interactions. Give your health care provider a list of all the medicines, herbs, non-prescription drugs, or dietary supplements you use. Also tell them if you smoke, drink alcohol, or use illegal drugs. Some items may interact with your medicine. What should I watch for while using this medicine? Visit your doctor or health care professional for regular checks on your progress. Check your blood pressure as directed. Ask your doctor or health care professional what your blood pressure should be and when you should contact him or her. Call your doctor or health care professional if you notice an irregular or fast heart beat. Women should inform their doctor if they wish to become pregnant or think they might be pregnant. There is a potential for serious side effects to an unborn child, particularly in the second or third trimester. Talk to your health care professional or pharmacist for more information. You may get drowsy or dizzy. Do not drive, use machinery, or do anything that needs mental alertness until you know how this drug affects you. Do not stand or sit up quickly, especially if you are an older patient. This reduces the risk of dizzy or fainting spells. Alcohol can make you more drowsy and dizzy. Avoid alcoholic drinks. Avoid salt substitutes unless you are told otherwise by your doctor or health care professional. Do not treat yourself for coughs, colds, or pain while you are taking this medicine without asking your doctor or health  care professional for advice. Some ingredients may increase your blood pressure. What side effects may I notice from receiving this medicine? Side effects that you should report to your doctor or health care professional as soon as possible:  confusion, dizziness, light headedness or fainting spells  decreased amount of urine passed  difficulty breathing or swallowing, hoarseness, or  tightening of the throat  fast or irregular heart beat, palpitations, or chest pain  skin rash, itching  swelling of your face, lips, tongue, hands, or feet Side effects that usually do not require medical attention (report to your doctor or health care professional if they continue or are bothersome):  cough  decreased sexual function or desire  headache  nasal congestion or stuffiness  nausea or stomach pain  sore or cramping muscles This list may not describe all possible side effects. Call your doctor for medical advice about side effects. You may report side effects to FDA at 1-800-FDA-1088. Where should I keep my medicine? Keep out of the reach of children and pets. Store at room temperature between 15 and 30 degrees C (59 and 86 degrees F). Protect from light. Keep the container tightly closed. Throw away any unused drug after the expiration date. NOTE: This sheet is a summary. It may not cover all possible information. If you have questions about this medicine, talk to your doctor, pharmacist, or health care provider.  2020 Elsevier/Gold Standard (2019-01-05 12:12:28)   Pravastatin tablets What is this medicine? PRAVASTATIN (PRA va stat in) is known as a HMG-CoA reductase inhibitor or 'statin'. It lowers the level of cholesterol and triglycerides in the blood. This drug may also reduce the risk of heart attack, stroke, or other health problems in patients with risk factors for heart disease. Diet and lifestyle changes are often used with this drug. This medicine may be used for other purposes; ask your health care provider or pharmacist if you have questions. COMMON BRAND NAME(S): Pravachol What should I tell my health care provider before I take this medicine? They need to know if you have any of these conditions:  diabetes  if you often drink alcohol  history of stroke  kidney disease  liver disease  muscle aches or weakness  thyroid disease  an unusual or  allergic reaction to pravastatin, other medicines, foods, dyes, or preservatives  pregnant or trying to get pregnant  breast-feeding How should I use this medicine? Take pravastatin tablets by mouth. Swallow the tablets with a drink of water. Pravastatin can be taken at anytime of the day, with or without food. Follow the directions on the prescription label. Take your doses at regular intervals. Do not take your medicine more often than directed. Talk to your pediatrician regarding the use of this medicine in children. Special care may be needed. Pravastatin has been used in children as young as 92 years of age. Overdosage: If you think you have taken too much of this medicine contact a poison control center or emergency room at once. NOTE: This medicine is only for you. Do not share this medicine with others. What if I miss a dose? If you miss a dose, take it as soon as you can. If it is almost time for your next dose, take only that dose. Do not take double or extra doses. What may interact with this medicine? This medicine may interact with the following medications:  colchicine  cyclosporine  other medicines for high cholesterol  some antibiotics like azithromycin, clarithromycin, erythromycin, and telithromycin This  list may not describe all possible interactions. Give your health care provider a list of all the medicines, herbs, non-prescription drugs, or dietary supplements you use. Also tell them if you smoke, drink alcohol, or use illegal drugs. Some items may interact with your medicine. What should I watch for while using this medicine? Visit your doctor or health care professional for regular check-ups. You may need regular tests to make sure your liver is working properly. Your health care professional may tell you to stop taking this medicine if you develop muscle problems. If your muscle problems do not go away after stopping this medicine, contact your health care  professional. Do not become pregnant while taking this medicine. Women should inform their health care professional if they wish to become pregnant or think they might be pregnant. There is a potential for serious side effects to an unborn child. Talk to your health care professional or pharmacist for more information. Do not breast-feed an infant while taking this medicine. This medicine may affect blood sugar levels. If you have diabetes, check with your doctor or health care professional before you change your diet or the dose of your diabetic medicine. If you are going to need surgery or other procedure, tell your doctor that you are using this medicine. This drug is only part of a total heart-health program. Your doctor or a dietician can suggest a low-cholesterol and low-fat diet to help. Avoid alcohol and smoking, and keep a proper exercise schedule. This medicine may cause a decrease in Co-Enzyme Q-10. You should make sure that you get enough Co-Enzyme Q-10 while you are taking this medicine. Discuss the foods you eat and the vitamins you take with your health care professional. What side effects may I notice from receiving this medicine? Side effects that you should report to your doctor or health care professional as soon as possible:  allergic reactions like skin rash, itching or hives, swelling of the face, lips, or tongue  dark urine  fever  muscle pain, cramps, or weakness  redness, blistering, peeling or loosening of the skin, including inside the mouth  trouble passing urine or change in the amount of urine  unusually weak or tired  yellowing of the eyes or skin Side effects that usually do not require medical attention (report to your doctor or health care professional if they continue or are bothersome):  gas  headache  heartburn  indigestion  stomach pain This list may not describe all possible side effects. Call your doctor for medical advice about side effects.  You may report side effects to FDA at 1-800-FDA-1088. Where should I keep my medicine? Keep out of the reach of children. Store at room temperature between 15 to 30 degrees C (59 to 86 degrees F). Protect from light. Keep container tightly closed. Throw away any unused medicine after the expiration date. NOTE: This sheet is a summary. It may not cover all possible information. If you have questions about this medicine, talk to your doctor, pharmacist, or health care provider.  2020 Elsevier/Gold Standard (2017-02-03 12:37:09)  DASH Eating Plan DASH stands for "Dietary Approaches to Stop Hypertension." The DASH eating plan is a healthy eating plan that has been shown to reduce high blood pressure (hypertension). It may also reduce your risk for type 2 diabetes, heart disease, and stroke. The DASH eating plan may also help with weight loss. What are tips for following this plan?  General guidelines  Avoid eating more than 2,300 mg (milligrams) of  salt (sodium) a day. If you have hypertension, you may need to reduce your sodium intake to 1,500 mg a day.  Limit alcohol intake to no more than 1 drink a day for nonpregnant women and 2 drinks a day for men. One drink equals 12 oz of beer, 5 oz of wine, or 1 oz of hard liquor.  Work with your health care provider to maintain a healthy body weight or to lose weight. Ask what an ideal weight is for you.  Get at least 30 minutes of exercise that causes your heart to beat faster (aerobic exercise) most days of the week. Activities may include walking, swimming, or biking.  Work with your health care provider or diet and nutrition specialist (dietitian) to adjust your eating plan to your individual calorie needs. Reading food labels   Check food labels for the amount of sodium per serving. Choose foods with less than 5 percent of the Daily Value of sodium. Generally, foods with less than 300 mg of sodium per serving fit into this eating plan.  To  find whole grains, look for the word "whole" as the first word in the ingredient list. Shopping  Buy products labeled as "low-sodium" or "no salt added."  Buy fresh foods. Avoid canned foods and premade or frozen meals. Cooking  Avoid adding salt when cooking. Use salt-free seasonings or herbs instead of table salt or sea salt. Check with your health care provider or pharmacist before using salt substitutes.  Do not fry foods. Cook foods using healthy methods such as baking, boiling, grilling, and broiling instead.  Cook with heart-healthy oils, such as olive, canola, soybean, or sunflower oil. Meal planning  Eat a balanced diet that includes: ? 5 or more servings of fruits and vegetables each day. At each meal, try to fill half of your plate with fruits and vegetables. ? Up to 6-8 servings of whole grains each day. ? Less than 6 oz of lean meat, poultry, or fish each day. A 3-oz serving of meat is about the same size as a deck of cards. One egg equals 1 oz. ? 2 servings of low-fat dairy each day. ? A serving of nuts, seeds, or beans 5 times each week. ? Heart-healthy fats. Healthy fats called Omega-3 fatty acids are found in foods such as flaxseeds and coldwater fish, like sardines, salmon, and mackerel.  Limit how much you eat of the following: ? Canned or prepackaged foods. ? Food that is high in trans fat, such as fried foods. ? Food that is high in saturated fat, such as fatty meat. ? Sweets, desserts, sugary drinks, and other foods with added sugar. ? Full-fat dairy products.  Do not salt foods before eating.  Try to eat at least 2 vegetarian meals each week.  Eat more home-cooked food and less restaurant, buffet, and fast food.  When eating at a restaurant, ask that your food be prepared with less salt or no salt, if possible. What foods are recommended? The items listed may not be a complete list. Talk with your dietitian about what dietary choices are best for  you. Grains Whole-grain or whole-wheat bread. Whole-grain or whole-wheat pasta. Brown rice. [place]. Bulgur. Whole-grain and low-sodium cereals. Pita bread. Low-fat, low-sodium crackers. Whole-wheat flour tortillas. Vegetables Fresh or frozen vegetables (raw, steamed, roasted, or grilled). Low-sodium or reduced-sodium tomato and vegetable juice. Low-sodium or reduced-sodium tomato sauce and tomato paste. Low-sodium or reduced-sodium canned vegetables. Fruits All fresh, dried, or frozen fruit. Canned fruit  in natural juice (without added sugar). Meat and other protein foods Skinless chicken or [place]. Ground chicken or [place]. Pork with fat trimmed off. Fish and seafood. Egg whites. Dried beans, peas, or lentils. Unsalted nuts, nut butters, and seeds. Unsalted canned beans. Lean cuts of beef with fat trimmed off. Low-sodium, lean deli meat. Dairy Low-fat (1%) or fat-free (skim) milk. Fat-free, low-fat, or reduced-fat cheeses. Nonfat, low-sodium ricotta or cottage cheese. Low-fat or nonfat yogurt. Low-fat, low-sodium cheese. Fats and oils Soft margarine without trans fats. Vegetable oil. Low-fat, reduced-fat, or light mayonnaise and salad dressings (reduced-sodium). Canola, safflower, olive, soybean, and sunflower oils. Avocado. Seasoning and other foods Herbs. Spices. Seasoning mixes without salt. Unsalted popcorn and pretzels. Fat-free sweets. What foods are not recommended? The items listed may not be a complete list. Talk with your dietitian about what dietary choices are best for you. Grains Baked goods made with fat, such as croissants, muffins, or some breads. Dry pasta or rice meal packs. Vegetables Creamed or fried vegetables. Vegetables in a cheese sauce. Regular canned vegetables (not low-sodium or reduced-sodium). Regular canned tomato sauce and paste (not low-sodium or reduced-sodium). Regular tomato and vegetable juice (not low-sodium or reduced-sodium). Angie Fava.  Olives. Fruits Canned fruit in a light or heavy syrup. Fried fruit. Fruit in cream or butter sauce. Meat and other protein foods Fatty cuts of meat. Ribs. Fried meat. Berniece Salines. Sausage. Bologna and other processed lunch meats. Salami. Fatback. Hotdogs. Bratwurst. Salted nuts and seeds. Canned beans with added salt. Canned or smoked fish. Whole eggs or egg yolks. Chicken or [place] with skin. Dairy Whole or 2% milk, cream, and half-and-half. Whole or full-fat cream cheese. Whole-fat or sweetened yogurt. Full-fat cheese. Nondairy creamers. Whipped toppings. Processed cheese and cheese spreads. Fats and oils Butter. Stick margarine. Lard. Shortening. Ghee. Bacon fat. Tropical oils, such as coconut, palm kernel, or palm oil. Seasoning and other foods Salted popcorn and pretzels. Onion salt, garlic salt, seasoned salt, table salt, and sea salt. Worcestershire sauce. Tartar sauce. Barbecue sauce. Teriyaki sauce. Soy sauce, including reduced-sodium. Steak sauce. Canned and packaged gravies. Fish sauce. Oyster sauce. Cocktail sauce. Horseradish that you find on the shelf. Ketchup. Mustard. Meat flavorings and tenderizers. Bouillon cubes. Hot sauce and Tabasco sauce. Premade or packaged marinades. Premade or packaged taco seasonings. Relishes. Regular salad dressings. Where to find more information:  National Heart, Lung, and [place]: https://wilson-eaton.com/  American Heart Association: www.heart.org Summary  The DASH eating plan is a healthy eating plan that has been shown to reduce high blood pressure (hypertension). It may also reduce your risk for type 2 diabetes, heart disease, and stroke.  With the DASH eating plan, you should limit salt (sodium) intake to 2,300 mg a day. If you have hypertension, you may need to reduce your sodium intake to 1,500 mg a day.  When on the DASH eating plan, aim to eat more fresh fruits and vegetables, whole grains, lean proteins, low-fat dairy, and heart-healthy  fats.  Work with your health care provider or diet and nutrition specialist (dietitian) to adjust your eating plan to your individual calorie needs. This information is not intended to replace advice given to you by your health care provider. Make sure you discuss any questions you have with your health care provider. Document Revised: 05/15/2017 Document Reviewed: 05/26/2016 Elsevier Patient Education  2020 [employer].

## 2020-01-20 NOTE — Progress Notes (Signed)
Chief Complaint  Patient presents with  . Medication question    Pt would like to discuss Insulin and have current prescriptions go to [facility]. He states that [employer] is giving him the incorrect amount of medication  . Diabetes   F/u 1. DM 2 last A1C 03/2019 6.7 on lantus 18 units and SSI and jardiance 25 mg qd was seeing [facility]  Needs refill insulin wants to increase to 20 units and sensor for libre  Declines statin due to myalgia was on lipitor and crestor in the past had body aches disc mevacor/pravachol to consider in the future per dm guidelines  2. Glaucoma on timolol right eye qhs and b/l eyes in am uses timolol and lumigan b/l qhs seeing [facility] MD and had surgery on left eye pressure in right 20 and left 14 so using timolol at in right eye 3x per day total   3. Elevated BP not on ARB w/o sx's and BP elevated multiple visits with other providers as well reviewed with pt   He will be taking road trip 01/23/20 on route 5 x 1 month with his 86 y.o brother    Review of Systems  Constitutional: Negative for weight loss.  HENT: Positive for hearing loss.   Eyes: Negative for blurred vision.  Respiratory: Negative for shortness of breath.   Cardiovascular: Negative for chest pain.  Gastrointestinal: Negative for abdominal pain.  Musculoskeletal: Negative for falls.  Skin: Negative for rash.  Neurological: Negative for headaches.  Psychiatric/Behavioral: Negative for depression.   Past Medical History:  Diagnosis Date  . Allergy    dust, grass   . Diabetes mellitus   . Glaucoma    [place] eye q 6 months   . Glaucoma   . Hyperlipidemia   . Migraines    rare   . Reflux esophagitis   . 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 Excised: 02/25/2016  . Wears hearing aid in both ears    Past Surgical History:  Procedure Laterality Date  . CATARACT EXTRACTION, BILATERAL    . COLONOSCOPY WITH  PROPOFOL N/A 09/11/2017   Procedure: COLONOSCOPY WITH PROPOFOL;  Surgeon: Manya Silvas, MD;  Location: [facility] ENDOSCOPY;  Service: Endoscopy;  Laterality: N/A;  . ESOPHAGOGASTRODUODENOSCOPY (EGD) WITH PROPOFOL N/A 02/02/2018   Procedure: ESOPHAGOGASTRODUODENOSCOPY (EGD) WITH PROPOFOL;  Surgeon: [name], MD;  Location: [facility] ENDOSCOPY;  Service: Gastroenterology;  Laterality: N/A;  . EYE SURGERY     glaucoma left   . HERNIA REPAIR    . IMAGE GUIDED SINUS SURGERY Bilateral 02/23/2018   Procedure: IMAGE GUIDED SINUS SURGERY Right ethmoidectomy with frontal exploration bilteral maxillary antrostomies Right sphenoidectomy;  Surgeon: Clyde Canterbury, MD;  Location: [place];  Service: ENT;  Laterality: Bilateral;  NEED STRYKER DISK GAVE DISK TO [facility] 9-9  . UMBILICAL HERNIA REPAIR  2002   Family History  Problem Relation Age of Onset  . Diabetes Mother   . Diabetes Sister   . Heart disease Brother   . Diabetes Brother   . Heart disease Brother   . Diabetes Brother   . Diabetes Brother   . Diabetes Sister   . Diabetes Sister    Social History   Socioeconomic History  . Marital status: Married    Spouse name: Not on file  . Number of children: 2  . Years of education: Not on file  . Highest education level: Not on file  Occupational History  .  Occupation: semi retired Museum/gallery curator asst,  does Engineer, manufacturing: SELF EMPLOYED  Tobacco Use  . Smoking status: Former Research scientist (life sciences)  . Smokeless tobacco: Never Used  . Tobacco comment: smoked some as teenager  Vaping Use  . Vaping Use: Never used  Substance and Sexual Activity  . Alcohol use: No    Alcohol/week: 0.0 standard drinks  . Drug use: No  . Sexual activity: Yes  Other Topics Concern  . Not on file  Social History Narrative   No living will   No health care POA---okay with wife making decisions. Daughter Joelene Millin would be alternate   Would accept resuscitation   Not sure about tube feeds   Married     Social Determinants of Health   Financial Resource Strain:   . Difficulty of Paying Living Expenses:   Food Insecurity:   . Worried About Charity fundraiser in the Last Year:   . Arboriculturist in the Last Year:   Transportation Needs:   . Film/video editor (Medical):   Alexis Sharp Lack of Transportation (Non-Medical):   Physical Activity:   . Days of Exercise per Week:   . Minutes of Exercise per Session:   Stress:   . Feeling of Stress :   Social Connections:   . Frequency of Communication with Friends and Family:   . Frequency of Social Gatherings with Friends and Family:   . Attends Religious Services:   . Active Member of Clubs or Organizations:   . Attends Archivist Meetings:   Alexis Sharp Marital Status:   Intimate Partner Violence:   . Fear of Current or Ex-Partner:   . Emotionally Abused:   Alexis Sharp Physically Abused:   . Sexually Abused:    Current Meds  Medication Sig  . bimatoprost (LUMIGAN) 0.01 % SOLN Place 1 drop into both eyes at bedtime.   . Continuous Blood Gluc Sensor (FREESTYLE LIBRE 14 DAY SENSOR) MISC 2 each by Other route every 14 (fourteen) days.  . empagliflozin (JARDIANCE) 25 MG TABS tablet Take 1 tablet (25 mg total) by mouth daily.  Alexis Sharp glucose blood (ONE TOUCH ULTRA TEST) test strip USE ONE STRIP TO CHECK GLUCOSE bid  . HYDROcodone-acetaminophen (NORCO/VICODIN) 5-325 MG tablet Take 1 tablet by mouth every 8 (eight) hours as needed for moderate pain.  Alexis Sharp insulin aspart (NOVOLOG FLEXPEN) 100 UNIT/ML FlexPen <70 0 units, 70-130 0 units,  131-180 2 units, 181-240 4 units, 241-300 6 units, 301-350 8 units, 351-400 10 units, >400 12 units and call doctor  . insulin glargine (LANTUS SOLOSTAR) 100 UNIT/ML Solostar Pen Inject 20 Units into the skin daily. At night. If having fasting blood glucose readings less than 70 mg dl then reduce dose.APPT FURTHER REFILLS  . Insulin Pen Needle (PEN NEEDLES) 31G X 6 MM MISC 1 Device by Does not apply route 3 (three) times daily  as needed.  . Insulin Pen Needle (PEN NEEDLES) 32G X 4 MM MISC 1 Units by Does not apply route daily. Use 1 pen needle daily with Lantus  . metFORMIN (GLUCOPHAGE) 500 MG tablet 500 mg in am with food and 1000 mg qhs with food.CALL AND SCHEDULE APPT FURTHER REFILLS NO APPT SINCE 07/2018  . Multiple Vitamin (MULTIVITAMIN) capsule Take 1 capsule by mouth daily.    Alexis Sharp omeprazole (PRILOSEC) 20 MG capsule Take 1 capsule (20 mg total) by mouth daily.  Glory Rosebush DELICA LANCETS FINE MISC Use to check blood sugar once a day. Dx  Code E11.9  . timolol (TIMOPTIC) 0.5 % ophthalmic solution Place 1 drop into both eyes daily. In the am b/l eyes and into right eye at night  . [DISCONTINUED] Continuous Blood Gluc Sensor (FREESTYLE LIBRE 14 DAY SENSOR) MISC 2 each by Other route every 14 (fourteen) days.  . [DISCONTINUED] empagliflozin (JARDIANCE) 25 MG TABS tablet Take 25 mg by mouth daily.  . [DISCONTINUED] insulin glargine (LANTUS SOLOSTAR) 100 UNIT/ML Solostar Pen Inject 18 Units into the skin daily. At night. If having fasting blood glucose readings less than 70 mg dl then reduce dose.APPT FURTHER REFILLS  . [DISCONTINUED] insulin glargine (LANTUS SOLOSTAR) 100 UNIT/ML Solostar Pen Inject 18 Units into the skin daily. At night. If having fasting blood glucose readings less than 70 mg dl then reduce dose.APPT FURTHER REFILLS  . [DISCONTINUED] omeprazole (PRILOSEC) 20 MG capsule Take 1 capsule (20 mg total) by mouth daily.   Allergies  Allergen Reactions  . Codeine   . Pioglitazone     REACTION: achy  . Sitagliptin Phosphate     REACTION: muscle aches  . Atorvastatin Other (See Comments)    myalgia  . Crestor [Rosuvastatin]     Muscle aching   No results found for this or any previous visit (from the past 2160 hour(s)). Objective  Body mass index is 29.33 kg/m. Wt Readings from Last 3 Encounters:  01/20/20 207 lb 6.4 oz (94.1 kg)  01/20/19 212 lb (96.2 kg)  08/06/18 225 lb 6.4 oz (102.2 kg)   Temp  Readings from Last 3 Encounters:  01/20/20 98 F (36.7 C)  01/20/19 98.3 F (36.8 C) (Oral)  08/06/18 98.1 F (36.7 C) (Oral)   BP Readings from Last 3 Encounters:  01/20/20 (!) 148/100  01/20/19 140/80  08/06/18 138/70   Pulse Readings from Last 3 Encounters:  01/20/20 61  01/20/19 67  08/06/18 67    Physical Exam Vitals and nursing note reviewed.  Constitutional:      Appearance: Normal appearance. He is well-developed, well-groomed and overweight.  HENT:     Head: Normocephalic and atraumatic.  Eyes:     Conjunctiva/sclera: Conjunctivae normal.     Pupils: Pupils are equal, round, and reactive to light.  Cardiovascular:     Rate and Rhythm: Normal rate and regular rhythm.     Heart sounds: Normal heart sounds. No murmur heard.   Pulmonary:     Effort: Pulmonary effort is normal.     Breath sounds: Normal breath sounds.  Abdominal:     Tenderness: There is no abdominal tenderness.  Skin:    General: Skin is warm and dry.  Neurological:     General: No focal deficit present.     Mental Status: He is alert and oriented to person, place, and time. Mental status is at baseline.     Gait: Gait normal.  Psychiatric:        Attention and Perception: Attention and perception normal.        Mood and Affect: Mood and affect normal.        Speech: Speech normal.        Behavior: Behavior normal. Behavior is cooperative.        Thought Content: Thought content normal.        Cognition and Memory: Cognition and memory normal.        Judgment: Judgment normal.     Assessment  Plan  Hypertension associated with diabetes ([place]) - Plan: Comprehensive metabolic panel, Lipid panel, CBC with Differential/Platelet, TSH,  Urinalysis, Routine w reflex microscopic, Hemoglobin A1c, Microalbumin / creatinine urine ratio, losartan (COZAAR) 25 MG tablet  Type 2 diabetes mellitus without complication, with long-term current use of insulin ([facility]) - Plan: Continuous Blood Gluc Sensor  (FREESTYLE LIBRE 14 DAY SENSOR) MISC, empagliflozin (JARDIANCE) 25 MG TABS tablet, insulin glargine (LANTUS SOLOSTAR) 100 UNIT/ML Solostar Pen increased from 18 to 20 units  Eye exam utd Foot exam today rec healthy diet and exercise  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 Dorna Bloom [facility] md from [facility]  HM Flu shot ? If had in 2020 prevnar utd, pna 23 had 10/24/15 utd Tdap utd,  shingrix per pt had2/2 at [place] 2/2   HCV neg 10/24/15  Colonoscopy had 09/11/17 for multiple polyps [facility] tubular f/u in 3 years 08/2020  Dermatology yearly Dr. Nicole Kindred h/o [place] 12/13/19; due to see w/in 6 months next week appt  PSA nl 01/08/18 0.53 do DRE in future AE Dr. Dorna Bloom 08/10/19 no retinopathy eye surface disease glaucoma surgery 2019 to reduce pressure left eye   Provider: Dr. Olivia Mackie McLean-Scocuzza-Internal Medicine

## 2020-02-10 ENCOUNTER — Encounter: Payer: Self-pay | Admitting: Internal Medicine

## 2020-03-07 ENCOUNTER — Other Ambulatory Visit: Payer: PPO

## 2020-03-09 ENCOUNTER — Other Ambulatory Visit (INDEPENDENT_AMBULATORY_CARE_PROVIDER_SITE_OTHER): Payer: PPO

## 2020-03-09 ENCOUNTER — Other Ambulatory Visit: Payer: Self-pay

## 2020-03-09 DIAGNOSIS — Z1329 Encounter for screening for other suspected endocrine disorder: Secondary | ICD-10-CM

## 2020-03-09 DIAGNOSIS — Z125 Encounter for screening for malignant neoplasm of prostate: Secondary | ICD-10-CM | POA: Diagnosis not present

## 2020-03-09 DIAGNOSIS — E1159 Type 2 diabetes mellitus with other circulatory complications: Secondary | ICD-10-CM | POA: Diagnosis not present

## 2020-03-09 DIAGNOSIS — I152 Hypertension secondary to endocrine disorders: Secondary | ICD-10-CM

## 2020-03-09 DIAGNOSIS — I10 Essential (primary) hypertension: Secondary | ICD-10-CM | POA: Diagnosis not present

## 2020-03-09 LAB — COMPREHENSIVE METABOLIC PANEL
ALT: 10 U/L (ref 0–53)
AST: 15 U/L (ref 0–37)
Albumin: 4.6 g/dL (ref 3.5–5.2)
Alkaline Phosphatase: 52 U/L (ref 39–117)
BUN: 16 mg/dL (ref 6–23)
CO2: 29 mEq/L (ref 19–32)
Calcium: 9.2 mg/dL (ref 8.4–10.5)
Chloride: 102 mEq/L (ref 96–112)
Creatinine, Ser: 0.98 mg/dL (ref 0.40–1.50)
GFR: 75.09 mL/min (ref >60.00)
Glucose, Bld: 78 mg/dL (ref 70–99)
Potassium: 4.5 mEq/L (ref 3.5–5.1)
Sodium: 139 mEq/L (ref 135–145)
Total Bilirubin: 0.7 mg/dL (ref 0.2–1.2)
Total Protein: 7 g/dL (ref 6.0–8.3)

## 2020-03-09 LAB — MICROALBUMIN / CREATININE URINE RATIO
Creatinine,U: 99.3 mg/dL
Microalb Creat Ratio: 0.7 mg/g (ref 0.0–30.0)
Microalb, Ur: <0.7 mg/dL (ref 0.0–1.9)

## 2020-03-09 LAB — HEMOGLOBIN A1C: Hgb A1c MFr Bld: 6.7 % — ABNORMAL HIGH (ref 4.6–6.5)

## 2020-03-09 LAB — CBC WITH DIFFERENTIAL/PLATELET
Basophils Absolute: 0.1 10*3/uL (ref 0.0–0.1)
Basophils Relative: 0.8 % (ref 0.0–3.0)
Eosinophils Absolute: 0.1 10*3/uL (ref 0.0–0.7)
Eosinophils Relative: 1 % (ref 0.0–5.0)
HCT: 47.3 % (ref 39.0–52.0)
Hemoglobin: 15.9 g/dL (ref 13.0–17.0)
Lymphocytes Relative: 22.9 % (ref 12.0–46.0)
Lymphs Abs: 1.6 10*3/uL (ref 0.7–4.0)
MCHC: 33.7 g/dL (ref 30.0–36.0)
MCV: 98.8 fl (ref 78.0–100.0)
Monocytes Absolute: 0.8 10*3/uL (ref 0.1–1.0)
Monocytes Relative: 11 % (ref 3.0–12.0)
Neutro Abs: 4.4 10*3/uL (ref 1.4–7.7)
Neutrophils Relative %: 64.3 % (ref 43.0–77.0)
Platelets: 190 10*3/uL (ref 150.0–400.0)
RBC: 4.78 Mil/uL (ref 4.22–5.81)
RDW: 13.6 % (ref 11.5–15.5)
WBC: 6.8 10*3/uL (ref 4.0–10.5)

## 2020-03-09 LAB — LIPID PANEL
Cholesterol: 174 mg/dL (ref 0–200)
HDL: 37.9 mg/dL — ABNORMAL LOW (ref >39.00)
LDL Cholesterol: 119 mg/dL — ABNORMAL HIGH (ref 0–99)
NonHDL: 135.65
Total CHOL/HDL Ratio: 5
Triglycerides: 83 mg/dL (ref 0.0–149.0)
VLDL: 16.6 mg/dL (ref 0.0–40.0)

## 2020-03-09 LAB — TSH: TSH: 1.81 u[IU]/mL (ref 0.35–4.50)

## 2020-03-09 LAB — PSA: PSA: 0.45 ng/mL (ref 0.10–4.00)

## 2020-03-10 LAB — URINALYSIS, ROUTINE W REFLEX MICROSCOPIC
Bilirubin Urine: NEGATIVE
Hgb urine dipstick: NEGATIVE
Leukocytes,Ua: NEGATIVE
Nitrite: NEGATIVE
Protein, ur: NEGATIVE
Specific Gravity, Urine: 1.025 (ref 1.001–1.03)
pH: 6.5 (ref 5.0–8.0)

## 2020-03-23 DIAGNOSIS — H401133 Primary open-angle glaucoma, bilateral, severe stage: Secondary | ICD-10-CM | POA: Diagnosis not present

## 2020-03-28 DIAGNOSIS — J309 Allergic rhinitis, unspecified: Secondary | ICD-10-CM | POA: Diagnosis not present

## 2020-03-28 DIAGNOSIS — Z03818 Encounter for observation for suspected exposure to other biological agents ruled out: Secondary | ICD-10-CM | POA: Diagnosis not present

## 2020-03-28 DIAGNOSIS — Z20822 Contact with and (suspected) exposure to covid-19: Secondary | ICD-10-CM | POA: Diagnosis not present

## 2020-04-03 DIAGNOSIS — J322 Chronic ethmoidal sinusitis: Secondary | ICD-10-CM | POA: Diagnosis not present

## 2020-04-03 DIAGNOSIS — J324 Chronic pansinusitis: Secondary | ICD-10-CM | POA: Diagnosis not present

## 2020-04-03 DIAGNOSIS — J320 Chronic maxillary sinusitis: Secondary | ICD-10-CM | POA: Diagnosis not present

## 2020-04-09 ENCOUNTER — Telehealth: Payer: Self-pay | Admitting: Internal Medicine

## 2020-04-09 DIAGNOSIS — E119 Type 2 diabetes mellitus without complications: Secondary | ICD-10-CM

## 2020-04-09 MED ORDER — METFORMIN HCL 500 MG PO TABS: ORAL_TABLET | ORAL | 1 refills | Status: DC

## 2020-04-09 NOTE — Addendum Note (Signed)
Addended by: Elpidio Galea T on: 04/09/2020 02:35 PM   Modules accepted: Orders

## 2020-04-09 NOTE — Telephone Encounter (Signed)
Pt needs a refill on metFORMIN (GLUCOPHAGE) 500 MG tablet sent to [address]   [facility] won't transfer rx

## 2020-04-16 DIAGNOSIS — Z01812 Encounter for preprocedural laboratory examination: Secondary | ICD-10-CM | POA: Diagnosis not present

## 2020-04-16 DIAGNOSIS — Z20822 Contact with and (suspected) exposure to covid-19: Secondary | ICD-10-CM | POA: Diagnosis not present

## 2020-04-18 DIAGNOSIS — H401113 Primary open-angle glaucoma, right eye, severe stage: Secondary | ICD-10-CM | POA: Diagnosis not present

## 2020-04-18 DIAGNOSIS — Z885 Allergy status to narcotic agent status: Secondary | ICD-10-CM | POA: Diagnosis not present

## 2020-04-18 DIAGNOSIS — Z888 Allergy status to other drugs, medicaments and biological substances status: Secondary | ICD-10-CM | POA: Diagnosis not present

## 2020-04-18 DIAGNOSIS — E1139 Type 2 diabetes mellitus with other diabetic ophthalmic complication: Secondary | ICD-10-CM | POA: Diagnosis not present

## 2020-04-20 DIAGNOSIS — J324 Chronic pansinusitis: Secondary | ICD-10-CM | POA: Diagnosis not present

## 2020-04-25 ENCOUNTER — Other Ambulatory Visit: Payer: Self-pay

## 2020-04-25 ENCOUNTER — Telehealth (INDEPENDENT_AMBULATORY_CARE_PROVIDER_SITE_OTHER): Payer: PPO | Admitting: Internal Medicine

## 2020-04-25 ENCOUNTER — Encounter: Payer: Self-pay | Admitting: Internal Medicine

## 2020-04-25 VITALS — BP 127/75 | HR 62 | Ht 70.51 in | Wt 189.1 lb

## 2020-04-25 DIAGNOSIS — I152 Hypertension secondary to endocrine disorders: Secondary | ICD-10-CM | POA: Diagnosis not present

## 2020-04-25 DIAGNOSIS — T466X5A Adverse effect of antihyperlipidemic and antiarteriosclerotic drugs, initial encounter: Secondary | ICD-10-CM

## 2020-04-25 DIAGNOSIS — G720 Drug-induced myopathy: Secondary | ICD-10-CM

## 2020-04-25 DIAGNOSIS — E119 Type 2 diabetes mellitus without complications: Secondary | ICD-10-CM | POA: Diagnosis not present

## 2020-04-25 DIAGNOSIS — E1159 Type 2 diabetes mellitus with other circulatory complications: Secondary | ICD-10-CM

## 2020-04-25 DIAGNOSIS — Z794 Long term (current) use of insulin: Secondary | ICD-10-CM | POA: Diagnosis not present

## 2020-04-25 DIAGNOSIS — J329 Chronic sinusitis, unspecified: Secondary | ICD-10-CM

## 2020-04-25 MED ORDER — LANTUS SOLOSTAR 100 UNIT/ML ~~LOC~~ SOPN: 22.0000 [IU] | PEN_INJECTOR | Freq: Every day | SUBCUTANEOUS | Status: DC

## 2020-04-25 NOTE — Progress Notes (Signed)
Virtual Visit via Video Note  I connected with Alexis Sharp  on 04/25/20 at  8:15 AM EST by a video enabled telemedicine application and verified that I am speaking with the correct person using two identifiers.  Location patient: home, [place] Location provider:work or home office Persons participating in the virtual visit: patient, provider ,pts wife   I discussed the limitations of evaluation and management by telemedicine and the availability of in person appointments. The patient expressed understanding and agreed to proceed.   HPI: 1. Labs 03/09/20 LDL 119 declines statin to due side effects and declines zetia for now h/o DM 2 controlled A1C 6.7 03/09/20 on insulin 22 units, SSI, jardiance 25 mg qd, metformin 500 in am and 1000 qhs  HTN controlled on losartan 25 mg qd though cma review he is not taking Improved BP  With weight loss 10-15 lbs by increased water and reduced CHO 2. Chronic sinusitis changed ENT dr. Virgia Land s/p surgery to Dr. Redmond Baseman and no new plan but consider surgery again and pt does nasacort allergy blood testing negative but he does have allergies to dust and grass he has +PND and cough at times nasacort dries his mouth declines atrovent for now and will try otc allergy pill  ROS: See pertinent positives and negatives per HPI.  Past Medical History:  Diagnosis Date  . Allergy    dust, grass   . Diabetes mellitus   . Glaucoma    [place] eye q 6 months   . Glaucoma   . Hyperlipidemia   . Migraines    rare   . Reflux esophagitis   . 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 Excised: 02/25/2016  . Wears hearing aid in both ears     Past Surgical History:  Procedure Laterality Date  . CATARACT EXTRACTION, BILATERAL    . COLONOSCOPY WITH PROPOFOL N/A 09/11/2017   Procedure: COLONOSCOPY WITH PROPOFOL;  Surgeon: Manya Silvas, MD;  Location: [facility] ENDOSCOPY;  Service: Endoscopy;  Laterality: N/A;  .  ESOPHAGOGASTRODUODENOSCOPY (EGD) WITH PROPOFOL N/A 02/02/2018   Procedure: ESOPHAGOGASTRODUODENOSCOPY (EGD) WITH PROPOFOL;  Surgeon: [name], MD;  Location: [facility] ENDOSCOPY;  Service: Gastroenterology;  Laterality: N/A;  . EYE SURGERY     glaucoma left   . HERNIA REPAIR    . IMAGE GUIDED SINUS SURGERY Bilateral 02/23/2018   Procedure: IMAGE GUIDED SINUS SURGERY Right ethmoidectomy with frontal exploration bilteral maxillary antrostomies Right sphenoidectomy;  Surgeon: Clyde Canterbury, MD;  Location: [place];  Service: ENT;  Laterality: Bilateral;  NEED STRYKER DISK GAVE DISK TO [facility] 9-9  . UMBILICAL HERNIA REPAIR  2002     Current Outpatient Medications:  .  bimatoprost (LUMIGAN) 0.01 % SOLN, Place 1 drop into both eyes at bedtime. , Disp: , Rfl:  .  Continuous Blood Gluc Sensor (FREESTYLE LIBRE 14 DAY SENSOR) MISC, 2 each by Other route every 14 (fourteen) days., Disp: 6 each, Rfl: 11 .  empagliflozin (JARDIANCE) 25 MG TABS tablet, Take 1 tablet (25 mg total) by mouth daily., Disp: 90 tablet, Rfl: 3 .  glucose blood (ONE TOUCH ULTRA TEST) test strip, USE ONE STRIP TO CHECK GLUCOSE bid, Disp: 200 each, Rfl: 3 .  insulin glargine (LANTUS SOLOSTAR) 100 UNIT/ML Solostar Pen, Inject 22 Units into the skin daily. At night. If having fasting blood glucose readings less than 70 mg dl then reduce dose.APPT FURTHER REFILLS, Disp: , Rfl:  .  Insulin Pen Needle (PEN NEEDLES)  31G X 6 MM MISC, 1 Device by Does not apply route 3 (three) times daily as needed., Disp: 300 each, Rfl: 3 .  Insulin Pen Needle (PEN NEEDLES) 32G X 4 MM MISC, 1 Units by Does not apply route daily. Use 1 pen needle daily with Lantus, Disp: 100 each, Rfl: 3 .  metFORMIN (GLUCOPHAGE) 500 MG tablet, 500 mg in am with food and 1000 mg qhs with food.CALL AND SCHEDULE APPT FURTHER REFILLS NO APPT SINCE 07/2018, Disp: 270 tablet, Rfl: 1 .  Multiple Vitamin (MULTIVITAMIN) capsule, Take 1 capsule by mouth daily.  , Disp: , Rfl:  .   omeprazole (PRILOSEC) 20 MG capsule, Take 1 capsule (20 mg total) by mouth daily., Disp: 90 capsule, Rfl: 3 .  ONETOUCH DELICA LANCETS FINE MISC, Use to check blood sugar once a day. Dx Code E11.9, Disp: 100 each, Rfl: 3 .  prednisoLONE acetate (PRED FORTE) 1 % ophthalmic suspension, Administer 1 drop to the right eye four (4) times a day., Disp: , Rfl:  .  timolol (TIMOPTIC) 0.5 % ophthalmic solution, Place 1 drop into both eyes daily. In the am b/l eyes and into right eye at night, Disp: , Rfl:  .  insulin aspart (NOVOLOG FLEXPEN) 100 UNIT/ML FlexPen, <70 0 units, 70-130 0 units,  131-180 2 units, 181-240 4 units, 241-300 6 units, 301-350 8 units, 351-400 10 units, >400 12 units and call doctor (Patient not taking: Reported on 04/25/2020), Disp: 15 mL, Rfl: 11 .  losartan (COZAAR) 25 MG tablet, Take 1 tablet (25 mg total) by mouth daily. (Patient not taking: Reported on 04/25/2020), Disp: 90 tablet, Rfl: 3  EXAM:  VITALS per patient if applicable:  GENERAL: alert, oriented, appears well and in no acute distress  HEENT: atraumatic, conjunttiva clear, no obvious abnormalities on inspection of external nose and ears  NECK: normal movements of the head and neck  LUNGS: on inspection no signs of respiratory distress, breathing rate appears normal, no obvious gross SOB, gasping or wheezing  CV: no obvious cyanosis  MS: moves all visible extremities without noticeable abnormality  PSYCH/NEURO: pleasant and cooperative, no obvious depression or anxiety, speech and thought processing grossly intact  ASSESSMENT AND PLAN:  Discussed the following assessment and plan:  Hypertension associated with diabetes ([place]) - Plan: insulin glargine (LANTUS SOLOSTAR) 100 UNIT/ML Solostar Pen 22 units with other meds jardiance 25 and metfromin 500 am and 1000 mg qhs  Cant tolerate statin  Last visit Rx losartan 25 but pt not taking per review today  Consider zetia LDL goal <100 declines today   Chronic  sinusitis, unspecified location F/u ENT Dr. Redmond Baseman  rec try otc allergy pill qhs prn allergy to dust and grass   HM Flu shot wants to do 05/2020 prevnar utd, pna 23 had 10/24/15 utd Tdap utd,  shingrix per pt had2/2 at [place] 3/3  HCV neg 10/24/15  Colonoscopy had 09/11/17 for multiple polyps [facility] tubular f/u in 3 years 08/2020  Dermatology yearly Dr. Nicole Kindred h/o [place] 12/13/19; due to see w/in 6 monthsnext week appt PSA nl 0.45 03/09/20 do DRE in future AE Dr. Dorna Bloom 08/10/19 no retinopathy eye surface disease glaucoma surgery 2019 to reduce pressureleft eye     -we discussed possible serious and likely etiologies, options for evaluation and workup, limitations of telemedicine visit vs in person visit, treatment, treatment risks and precautions.     I discussed the assessment and treatment plan with the patient. The patient  was provided an opportunity to ask questions and all were answered. The patient agreed with the plan and demonstrated an understanding of the instructions.    Time spent 20 minutes  Delorise Jackson, MD

## 2020-05-01 DIAGNOSIS — J0191 Acute recurrent sinusitis, unspecified: Secondary | ICD-10-CM | POA: Diagnosis not present

## 2020-05-16 DIAGNOSIS — J019 Acute sinusitis, unspecified: Secondary | ICD-10-CM | POA: Diagnosis not present

## 2020-05-30 DIAGNOSIS — J329 Chronic sinusitis, unspecified: Secondary | ICD-10-CM | POA: Diagnosis not present

## 2020-05-30 DIAGNOSIS — K219 Gastro-esophageal reflux disease without esophagitis: Secondary | ICD-10-CM | POA: Diagnosis not present

## 2020-06-22 ENCOUNTER — Other Ambulatory Visit: Payer: Self-pay

## 2020-06-22 ENCOUNTER — Ambulatory Visit (INDEPENDENT_AMBULATORY_CARE_PROVIDER_SITE_OTHER): Payer: PPO | Admitting: *Deleted

## 2020-06-22 DIAGNOSIS — Z23 Encounter for immunization: Secondary | ICD-10-CM

## 2020-06-26 ENCOUNTER — Other Ambulatory Visit: Payer: Self-pay

## 2020-06-26 ENCOUNTER — Ambulatory Visit: Payer: PPO | Admitting: Dermatology

## 2020-06-26 DIAGNOSIS — L570 Actinic keratosis: Secondary | ICD-10-CM | POA: Diagnosis not present

## 2020-06-26 DIAGNOSIS — L820 Inflamed seborrheic keratosis: Secondary | ICD-10-CM | POA: Diagnosis not present

## 2020-06-26 DIAGNOSIS — L578 Other skin changes due to chronic exposure to nonionizing radiation: Secondary | ICD-10-CM

## 2020-06-26 DIAGNOSIS — L280 Lichen simplex chronicus: Secondary | ICD-10-CM | POA: Diagnosis not present

## 2020-06-26 DIAGNOSIS — D692 Other nonthrombocytopenic purpura: Secondary | ICD-10-CM

## 2020-06-26 DIAGNOSIS — L299 Pruritus, unspecified: Secondary | ICD-10-CM

## 2020-06-26 MED ORDER — CLOBETASOL PROPIONATE 0.05 % EX CREA: TOPICAL_CREAM | CUTANEOUS | 1 refills | Status: DC

## 2020-06-26 NOTE — Progress Notes (Signed)
Follow-Up Visit   Subjective  Alexis Sharp is a 75 y.o. male who presents for the following: Follow-up (Hx of AKs. He noticed a rough spot on his right temple scalp, itchy at times. He also has ISK vs Nummular Derm of the right lower leg. He uses TMC 0.1% Cream prn flares. Area never clears.).   The following portions of the chart were reviewed this encounter and updated as appropriate:       Review of Systems:  No other skin or systemic complaints except as noted in HPI or Assessment and Plan.  Objective  Well appearing patient in no apparent distress; mood and affect are within normal limits.  A focused examination was performed including face, scalp, right leg. Relevant physical exam findings are noted in the Assessment and Plan.  Objective  Right Lower Pretibia: Pink scaly patch  Objective  Right Temporal Hairline x 1, L chest x 1 (2): Waxy tan patch on right temporal hairline. Erythematous keratotic stuck-on papule on left chest, 1.5cm.  Objective  R temple x 4, frontal scalp x 1, L forehead x 1: Erythematous thin papules/macules with gritty scale.   Objective  Right Wrist: Clear today.   Assessment & Plan    Actinic Damage - chronic, secondary to cumulative UV radiation exposure/sun exposure over time - diffuse scaly erythematous macules with underlying dyspigmentation - Recommend daily broad spectrum sunscreen SPF 30+ to sun-exposed areas, reapply every 2 hours as needed.  - Call for new or changing lesions.  Purpura - Chronic; persistent and recurrent.  Treatable, but not curable. - Violaceous macules and patches - Benign - Related to trauma, age, sun damage and/or use of blood thinners, chronic use of topical and/or oral steroids - Observe - Can use OTC arnica containing moisturizer such as Dermend Bruise Formula if desired - Call for worsening or other concerns   Lichen simplex chronicus Right Lower Pretibia  Chronic dermatitis- Not clearing with  topical TMC cream Start clobetasol cream Apply to AA R lower leg BID x 2-4 weeks dsp 30g 1Rf. Avoid face, groin, axilla.  Recommend mild soap and moisturizing cream 1-2 times daily.  Sample of Eucerin Avanced Cream daily to lower legs.  Topical steroids (such as triamcinolone, fluocinolone, fluocinonide, mometasone, clobetasol, halobetasol, betamethasone, hydrocortisone) can cause thinning and lightening of the skin if they are used for too long in the same area. Your physician has selected the right strength medicine for your problem and area affected on the body. Please use your medication only as directed by your physician to prevent side effects.    clobetasol cream (TEMOVATE) 0.05 % - Right Lower Pretibia  Inflamed seborrheic keratosis (2) Right Temporal Hairline x 1, L chest x 1  Destruction of lesion - Right Temporal Hairline x 1, L chest x 1  Destruction method: cryotherapy   Informed consent: discussed and consent obtained   Lesion destroyed using liquid nitrogen: Yes   Region frozen until ice ball extended beyond lesion: Yes   Outcome: patient tolerated procedure well with no complications   Post-procedure details: wound care instructions given    AK (actinic keratosis) R temple x 4, frontal scalp x 1, L forehead x 1  Destruction of lesion - R temple x 4, frontal scalp x 1, L forehead x 1  Destruction method: cryotherapy   Informed consent: discussed and consent obtained   Lesion destroyed using liquid nitrogen: Yes   Region frozen until ice ball extended beyond lesion: Yes   Outcome: patient  tolerated procedure well with no complications   Post-procedure details: wound care instructions given    Pruritus Right Wrist  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  Return in about 1 month (around 07/27/2020) for [facility] .   IJamesetta Orleans, CMA, am acting as scribe for Brendolyn Patty, MD .  Documentation: I have  reviewed the above documentation for accuracy and completeness, and I agree with the above.  Brendolyn Patty MD

## 2020-06-26 NOTE — Patient Instructions (Addendum)
Topical steroids (such as triamcinolone, fluocinolone, fluocinonide, mometasone, clobetasol, halobetasol, betamethasone, hydrocortisone) can cause thinning and lightening of the skin if they are used for too long in the same area. Your physician has selected the right strength medicine for your problem and area affected on the body. Please use your medication only as directed by your physician to prevent side effects.   Recommend mild soap and moisturizing cream 1-2 times daily.    May try Eucerin 12 Hour Itch Relief with menthol or CeraVe Itch Relief to lower leg and right wrist as needed for itch.   Cryotherapy Aftercare  . Wash gently with soap and water everyday.   Marland Kitchen Apply Vaseline and Band-Aid daily until healed.

## 2020-06-27 ENCOUNTER — Ambulatory Visit (INDEPENDENT_AMBULATORY_CARE_PROVIDER_SITE_OTHER): Payer: PPO | Admitting: Surgery

## 2020-06-27 ENCOUNTER — Encounter: Payer: Self-pay | Admitting: Surgery

## 2020-06-27 VITALS — BP 143/80 | HR 73 | Temp 98.1°F | Ht 71.5 in | Wt 204.0 lb

## 2020-06-27 DIAGNOSIS — K449 Diaphragmatic hernia without obstruction or gangrene: Secondary | ICD-10-CM

## 2020-06-27 NOTE — Patient Instructions (Addendum)
A CT  has been scheduled. Appointment for CT is 07/06/2020 @ 9:30 am (arrive at 9 am), pick up contrast and nothing to eat or drink 4 hours prior. Alexis Sharp will go to the [facility] at [place] your appointment with Dr.Toledo to get EGD. Call us to schedule your next appointment.

## 2020-06-29 DIAGNOSIS — H401133 Primary open-angle glaucoma, bilateral, severe stage: Secondary | ICD-10-CM | POA: Diagnosis not present

## 2020-06-29 DIAGNOSIS — K449 Diaphragmatic hernia without obstruction or gangrene: Secondary | ICD-10-CM | POA: Diagnosis not present

## 2020-06-29 DIAGNOSIS — K2100 Gastro-esophageal reflux disease with esophagitis, without bleeding: Secondary | ICD-10-CM | POA: Diagnosis not present

## 2020-06-29 DIAGNOSIS — Z86010 Personal history of colonic polyps: Secondary | ICD-10-CM | POA: Diagnosis not present

## 2020-06-29 DIAGNOSIS — R1319 Other dysphagia: Secondary | ICD-10-CM | POA: Diagnosis not present

## 2020-07-03 NOTE — Progress Notes (Signed)
Patient ID: Alexis Sharp, male   DOB: 07/26/1945, 75 y.o.   MRN: [id number]  HPI Alexis Sharp is a 75 y.o. male seen in consultation at the request of Dr. Lanney Gins for a pneumatic paraesophageal hernia. He does report dyspnea on exertion as well as chronic cough.  He reports significant reflux with heartburn that is worsening when he lays down supine.  Does have a component of some dysphagia for certain meals.  He did have an upper endoscopy by Dr. Vicente Males 2 and half years ago showing evidence of a large hiatal hernia.  He also had a barium swallow that I have also personally reviewed.  There is evidence of moderate sized hiatal hernia with moderate reflux.  No evidence of stenosis.  Has had a history of recurrent sinus infections.  He does take some PPI. CBC and CMP were normal. HPI  Past Medical History:  Diagnosis Date  . Allergy    dust, grass   . Diabetes mellitus   . Glaucoma    [place] [facility] q 6 months   . Glaucoma   . Hyperlipidemia   . Migraines    rare   . Reflux esophagitis   . 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 Excised: 02/25/2016  . Wears hearing aid in both ears     Past Surgical History:  Procedure Laterality Date  . CATARACT EXTRACTION, BILATERAL    . COLONOSCOPY WITH PROPOFOL N/A 09/11/2017   Procedure: COLONOSCOPY WITH PROPOFOL;  Surgeon: Manya Silvas, MD;  Location: [facility] ENDOSCOPY;  Service: Endoscopy;  Laterality: N/A;  . ESOPHAGOGASTRODUODENOSCOPY (EGD) WITH PROPOFOL N/A 02/02/2018   Procedure: ESOPHAGOGASTRODUODENOSCOPY (EGD) WITH PROPOFOL;  Surgeon: [name], MD;  Location: [facility] ENDOSCOPY;  Service: Gastroenterology;  Laterality: N/A;  . EYE SURGERY     glaucoma left   . HERNIA REPAIR    . IMAGE GUIDED SINUS SURGERY Bilateral 02/23/2018   Procedure: IMAGE GUIDED SINUS SURGERY Right ethmoidectomy with frontal exploration bilteral maxillary antrostomies Right sphenoidectomy;   Surgeon: Clyde Canterbury, MD;  Location: [place];  Service: ENT;  Laterality: Bilateral;  NEED STRYKER DISK GAVE DISK TO CE[facility] 9-9  . UMBILICAL HERNIA REPAIR  2002    Family History  Problem Relation Age of Onset  . Diabetes Mother   . Diabetes Sister   . Heart disease Brother   . Diabetes Brother   . Heart disease Brother   . Diabetes Brother   . Diabetes Brother   . Diabetes Sister   . Diabetes Sister     Social History Social History   Tobacco Use  . Smoking status: Former Research scientist (life sciences)  . Smokeless tobacco: Never Used  . Tobacco comment: smoked some as teenager  Vaping Use  . Vaping Use: Never used  Substance Use Topics  . Alcohol use: No    Alcohol/week: 0.0 standard drinks  . Drug use: No    Allergies  Allergen Reactions  . Codeine   . Pioglitazone     REACTION: achy  . Sitagliptin Phosphate     REACTION: muscle aches  . Atorvastatin Other (See Comments)    myalgia  . Crestor [Rosuvastatin]     Muscle aching    Current Outpatient Medications  Medication Sig Dispense Refill  . bimatoprost (LUMIGAN) 0.01 % SOLN Place 1 drop into both eyes at bedtime.     . clobetasol cream (TEMOVATE) 0.05 % Apply to affected area on right lower leg 2 times a  day for 2-4 weeks. Avoid face, groin, underarms. 30 g 1  . Continuous Blood Gluc Sensor (FREESTYLE LIBRE 14 DAY SENSOR) MISC 2 each by Other route every 14 (fourteen) days. 6 each 11  . empagliflozin (JARDIANCE) 25 MG TABS tablet Take 1 tablet (25 mg total) by mouth daily. 90 tablet 3  . glucose blood (ONE TOUCH ULTRA TEST) test strip USE ONE STRIP TO CHECK GLUCOSE bid 200 each 3  . insulin aspart (NOVOLOG FLEXPEN) 100 UNIT/ML FlexPen <70 0 units, 70-130 0 units,  131-180 2 units, 181-240 4 units, 241-300 6 units, 301-350 8 units, 351-400 10 units, >400 12 units and call doctor (Patient taking differently: <70 0 units, 70-130 0 units,  131-180 2 units, 181-240 4 units, 241-300 6 units, 301-350 8 units, 351-400 10 units,  >400 12 units and call doctor) 15 mL 11  . insulin glargine (LANTUS SOLOSTAR) 100 UNIT/ML Solostar Pen Inject 22 Units into the skin daily. At night. If having fasting blood glucose readings less than 70 mg dl then reduce dose.APPT FURTHER REFILLS    . Insulin Pen Needle (PEN NEEDLES) 31G X 6 MM MISC 1 Device by Does not apply route 3 (three) times daily as needed. 300 each 3  . Insulin Pen Needle (PEN NEEDLES) 32G X 4 MM MISC 1 Units by Does not apply route daily. Use 1 pen needle daily with Lantus 100 each 3  . metFORMIN (GLUCOPHAGE) 500 MG tablet 500 mg in am with food and 1000 mg qhs with food.CALL AND SCHEDULE APPT FURTHER REFILLS NO APPT SINCE 07/2018 270 tablet 1  . Multiple Vitamin (MULTIVITAMIN) capsule Take 1 capsule by mouth daily.    Marland Kitchen omeprazole (PRILOSEC) 20 MG capsule Take 1 capsule (20 mg total) by mouth daily. 90 capsule 3  . ONETOUCH DELICA LANCETS FINE MISC Use to check blood sugar once a day. Dx Code E11.9 100 each 3  . timolol (TIMOPTIC) 0.5 % ophthalmic solution Place 1 drop into both eyes daily. In the am b/l eyes and into right eye at night     No current facility-administered medications for this visit.     Review of Systems Full ROS  was asked and was negative except for the information on the HPI  Physical Exam Blood pressure (!) 143/80, pulse 73, temperature 98.1 F (36.7 C), temperature source Oral, height 5' 11.5" (1.816 m), weight 204 lb (92.5 kg), SpO2 95 %. CONSTITUTIONAL: NAD. EYES: Pupils are equal, round,Sclera are non-icteric. EARS, NOSE, MOUTH AND THROAT: He is wearing a mask. Hearing is intact to voice. LYMPH NODES:  Lymph nodes in the neck are normal. RESPIRATORY:  Lungs are clear. There is normal respiratory effort, with equal breath sounds bilaterally, and without pathologic use of accessory muscles. CARDIOVASCULAR: Heart is regular without murmurs, gallops, or rubs. GI: The abdomen is  soft, nontender, and nondistended. There are no palpable masses.  There is no hepatosplenomegaly. There are normal bowel sounds in all quadrants. GU: Rectal deferred.   MUSCULOSKELETAL: Normal muscle strength and tone. No cyanosis or edema.   SKIN: Turgor is good and there are no pathologic skin lesions or ulcers. NEUROLOGIC: Motor and sensation is grossly normal. Cranial nerves are grossly intact. PSYCH:  Oriented to person, place and time. Affect is normal.  Data Reviewed  I have personally reviewed the patient's imaging, laboratory findings and medical records.    Assessment/Plan 75 year old male with a symptomatic paraesophageal hernia and significant reflux.  Discussed with the patient about his disease process.  We will start work-up with a CT scan of the chest abdomen and pelvis.  We will also refer him to GI for an upper endoscopy to evaluate his esophageal anatomy intraluminally. Also discussed with him in detail about his disease process.  He seems to require surgical intervention at some point in time but first order of business is to evaluate mediastinal and intra-abdominal anatomy.  We will see him back after he completes the studies and then we will talk some more about neck steps in therapy    Caroleen Hamman, MD [place] Surgeon 07/03/2020, 12:56 PM

## 2020-07-04 DIAGNOSIS — J310 Chronic rhinitis: Secondary | ICD-10-CM | POA: Diagnosis not present

## 2020-07-04 DIAGNOSIS — J329 Chronic sinusitis, unspecified: Secondary | ICD-10-CM | POA: Diagnosis not present

## 2020-07-06 ENCOUNTER — Other Ambulatory Visit: Payer: Self-pay

## 2020-07-06 ENCOUNTER — Other Ambulatory Visit: Payer: PPO

## 2020-07-06 ENCOUNTER — Ambulatory Visit
Admission: RE | Admit: 2020-07-06 | Discharge: 2020-07-06 | Disposition: A | Discharge status: Home / Self Care | Payer: PPO | Source: Ambulatory Visit | Attending: Surgery | Admitting: Surgery

## 2020-07-06 DIAGNOSIS — R1319 Other dysphagia: Secondary | ICD-10-CM | POA: Diagnosis not present

## 2020-07-06 DIAGNOSIS — K429 Umbilical hernia without obstruction or gangrene: Secondary | ICD-10-CM | POA: Diagnosis not present

## 2020-07-06 DIAGNOSIS — K449 Diaphragmatic hernia without obstruction or gangrene: Secondary | ICD-10-CM | POA: Diagnosis not present

## 2020-07-06 DIAGNOSIS — M1612 Unilateral primary osteoarthritis, left hip: Secondary | ICD-10-CM | POA: Diagnosis not present

## 2020-07-06 DIAGNOSIS — R0609 Other forms of dyspnea: Secondary | ICD-10-CM | POA: Diagnosis not present

## 2020-07-06 DIAGNOSIS — I2510 Atherosclerotic heart disease of native coronary artery without angina pectoris: Secondary | ICD-10-CM | POA: Diagnosis not present

## 2020-07-06 LAB — POCT I-STAT CREATININE: Creatinine, Ser: 1 mg/dL (ref 0.61–1.24)

## 2020-07-06 MED ORDER — IOHEXOL 300 MG/ML  SOLN
100.0000 mL | Freq: Once | INTRAMUSCULAR | Status: AC | PRN
  Administered 2020-07-06: 100 mL via INTRAVENOUS

## 2020-07-09 ENCOUNTER — Telehealth: Payer: Self-pay

## 2020-07-09 NOTE — Telephone Encounter (Signed)
Called pt about CT results and VM is not set up to LM.

## 2020-07-09 NOTE — Telephone Encounter (Signed)
Pt notified. Verbalizes understanding. Pt will have and endo/colonoscopy on 07/31/2020 and follow up with Dr. Dahlia Byes on 08/15/2020.

## 2020-07-10 ENCOUNTER — Ambulatory Visit: Payer: PPO

## 2020-07-27 DIAGNOSIS — Z20822 Contact with and (suspected) exposure to covid-19: Secondary | ICD-10-CM | POA: Diagnosis not present

## 2020-07-27 DIAGNOSIS — Z01812 Encounter for preprocedural laboratory examination: Secondary | ICD-10-CM | POA: Diagnosis not present

## 2020-07-31 ENCOUNTER — Other Ambulatory Visit: Payer: Self-pay | Admitting: Internal Medicine

## 2020-07-31 DIAGNOSIS — R1314 Dysphagia, pharyngoesophageal phase: Secondary | ICD-10-CM | POA: Diagnosis not present

## 2020-07-31 DIAGNOSIS — K640 First degree hemorrhoids: Secondary | ICD-10-CM | POA: Diagnosis not present

## 2020-07-31 DIAGNOSIS — K2970 Gastritis, unspecified, without bleeding: Secondary | ICD-10-CM | POA: Diagnosis not present

## 2020-07-31 DIAGNOSIS — K449 Diaphragmatic hernia without obstruction or gangrene: Secondary | ICD-10-CM | POA: Diagnosis not present

## 2020-07-31 DIAGNOSIS — K222 Esophageal obstruction: Secondary | ICD-10-CM | POA: Diagnosis not present

## 2020-07-31 DIAGNOSIS — K2100 Gastro-esophageal reflux disease with esophagitis, without bleeding: Secondary | ICD-10-CM | POA: Diagnosis not present

## 2020-07-31 DIAGNOSIS — Z86010 Personal history of colonic polyps: Secondary | ICD-10-CM | POA: Diagnosis not present

## 2020-08-01 ENCOUNTER — Ambulatory Visit: Payer: PPO | Admitting: Dermatology

## 2020-08-03 LAB — CYTOLOGY - NON PAP

## 2020-08-06 ENCOUNTER — Encounter: Payer: Self-pay | Admitting: Internal Medicine

## 2020-08-06 ENCOUNTER — Telehealth: Payer: Self-pay | Admitting: Internal Medicine

## 2020-08-06 ENCOUNTER — Other Ambulatory Visit: Payer: Self-pay | Admitting: Internal Medicine

## 2020-08-06 DIAGNOSIS — Z794 Long term (current) use of insulin: Secondary | ICD-10-CM

## 2020-08-06 DIAGNOSIS — E119 Type 2 diabetes mellitus without complications: Secondary | ICD-10-CM

## 2020-08-06 MED ORDER — EMPAGLIFLOZIN 25 MG PO TABS: 25.0000 mg | ORAL_TABLET | Freq: Every day | ORAL | 3 refills | Status: DC

## 2020-08-06 NOTE — Telephone Encounter (Signed)
Placed in sign folder on your desk

## 2020-08-06 NOTE — Telephone Encounter (Signed)
Please get jardiance Rx off printer and onto my desk to sign and pick up 08/07/20   Thank you

## 2020-08-14 ENCOUNTER — Ambulatory Visit: Payer: PPO | Admitting: Dermatology

## 2020-08-14 ENCOUNTER — Other Ambulatory Visit: Payer: Self-pay

## 2020-08-14 DIAGNOSIS — L280 Lichen simplex chronicus: Secondary | ICD-10-CM

## 2020-08-14 NOTE — Progress Notes (Signed)
   Follow-Up Visit   Subjective  Alexis Sharp is a 75 y.o. male who presents for the following: Follow-up [facility] of the right lower pretibia, improved with clobetasol cream. Area is not itchy now.).   The following portions of the chart were reviewed this encounter and updated as appropriate:       Review of Systems:  No other skin or systemic complaints except as noted in HPI or Assessment and Plan.  Objective  Well appearing patient in no apparent distress; mood and affect are within normal limits.  A focused examination was performed including right lower leg. Relevant physical exam findings are noted in the Assessment and Plan.  Objective  Right Lower Pretibia: Light pink scaly patch with associated xerosis.   Assessment & Plan  Lichen simplex chronicus Right Lower Pretibia  Much improved.  If recurs, restart clobetasol cream spot treating until improved. Avoid face, groin, axilla.  Caution atrophy with long-term use.   Recommend mild soap and moisturizing cream 1-2 times daily to legs to prevent flares.   Topical steroids (such as triamcinolone, fluocinolone, fluocinonide, mometasone, clobetasol, halobetasol, betamethasone, hydrocortisone) can cause thinning and lightening of the skin if they are used for too long in the same area. Your physician has selected the right strength medicine for your problem and area affected on the body. Please use your medication only as directed by your physician to prevent side effects.      Other Related Medications clobetasol cream (TEMOVATE) 0.05 %  Return in about 4 months (around 12/14/2020) for AKs.   Documentation: I have reviewed the above documentation for accuracy and completeness, and I agree with the above.  Brendolyn Patty MD

## 2020-08-14 NOTE — Patient Instructions (Signed)
Topical steroids (such as triamcinolone, fluocinolone, fluocinonide, mometasone, clobetasol, halobetasol, betamethasone, hydrocortisone) can cause thinning and lightening of the skin if they are used for too long in the same area. Your physician has selected the right strength medicine for your problem and area affected on the body. Please use your medication only as directed by your physician to prevent side effects.   Recommend mild soap and moisturizing cream 1-2 times daily.   

## 2020-08-15 ENCOUNTER — Ambulatory Visit (INDEPENDENT_AMBULATORY_CARE_PROVIDER_SITE_OTHER): Payer: PPO | Admitting: Surgery

## 2020-08-15 ENCOUNTER — Other Ambulatory Visit: Payer: Self-pay

## 2020-08-15 ENCOUNTER — Encounter: Payer: Self-pay | Admitting: Surgery

## 2020-08-15 VITALS — BP 137/84 | HR 60 | Temp 98.2°F | Ht 71.5 in | Wt 204.2 lb

## 2020-08-15 DIAGNOSIS — K449 Diaphragmatic hernia without obstruction or gangrene: Secondary | ICD-10-CM | POA: Diagnosis not present

## 2020-08-15 NOTE — Patient Instructions (Addendum)
Our surgery scheduler will call you within 24-48 hours to schedule your surgery. Please have the [facility] surgery sheet available when speaking with her.   Barium swallow study scheduled 08/20/20 @ [place] @ 9 am. Please check in at the radiology  registration desk.Nothing to eat/drink 4 hours.

## 2020-08-16 ENCOUNTER — Telehealth: Payer: Self-pay | Admitting: Surgery

## 2020-08-16 NOTE — Telephone Encounter (Signed)
Patient has been advised of Pre-Admission date/time, COVID Testing date and Surgery date.  Surgery Date: 08/30/20 Preadmission Testing Date: 08/23/20 (phone 8a-1p) Covid Testing Date: 08/28/20 - patient advised to go to the [facility] ([place]) between 8a-1p  Patient has been made aware to call [phone], between 1-3:00pm the day before surgery, to find out what time to arrive for surgery.

## 2020-08-17 DIAGNOSIS — H401133 Primary open-angle glaucoma, bilateral, severe stage: Secondary | ICD-10-CM | POA: Diagnosis not present

## 2020-08-20 ENCOUNTER — Ambulatory Visit
Admission: RE | Admit: 2020-08-20 | Discharge: 2020-08-20 | Disposition: A | Discharge status: Home / Self Care | Payer: PPO | Source: Ambulatory Visit | Attending: Surgery | Admitting: Surgery

## 2020-08-20 ENCOUNTER — Other Ambulatory Visit: Payer: Self-pay | Admitting: Surgery

## 2020-08-20 ENCOUNTER — Other Ambulatory Visit: Payer: Self-pay

## 2020-08-20 DIAGNOSIS — K449 Diaphragmatic hernia without obstruction or gangrene: Secondary | ICD-10-CM | POA: Diagnosis not present

## 2020-08-20 DIAGNOSIS — R1310 Dysphagia, unspecified: Secondary | ICD-10-CM | POA: Diagnosis not present

## 2020-08-21 ENCOUNTER — Telehealth: Payer: Self-pay

## 2020-08-21 NOTE — H&P (View-Only) (Signed)
Patient ID: Alexis Sharp, male   DOB: [birth year], 75 y.o.   MRN: [id number]  HPI Alexis Sharp is a 75 y.o. male well-known to me with a symptomatic paraesophageal hernia.  He does have reflux and chronic cough.  He did complete his work-up to include barium swallow, CT scan of the abdomen pelvis as well as an upper endoscopy.  Please note that I have personally reviewed all the images.  He does have a paraesophageal hernia type III with about a third of the stomach within the mediastinum.  He also underwent endoscopy showing evidence of a stricture that was dilated.   He is doing okay and denies any cardiac or pulmonary acute symptoms at this time.  No fevers no chills.  He is able to perform more than 4 METS of activity without any shortness of breath or chest pain.   HPI  Past Medical History:  Diagnosis Date  . Allergy    dust, grass   . Diabetes mellitus   . Glaucoma    [place] [facility] q 6 months   . Glaucoma   . Hyperlipidemia   . Migraines    rare   . Reflux esophagitis   . 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 Excised: 02/25/2016  . Wears hearing aid in both ears     Past Surgical History:  Procedure Laterality Date  . CATARACT EXTRACTION, BILATERAL    . COLONOSCOPY WITH PROPOFOL N/A 09/11/2017   Procedure: COLONOSCOPY WITH PROPOFOL;  Surgeon: Manya Silvas, MD;  Location: [facility] ENDOSCOPY;  Service: Endoscopy;  Laterality: N/A;  . ESOPHAGOGASTRODUODENOSCOPY (EGD) WITH PROPOFOL N/A 02/02/2018   Procedure: ESOPHAGOGASTRODUODENOSCOPY (EGD) WITH PROPOFOL;  Surgeon: [name], MD;  Location: [facility] ENDOSCOPY;  Service: Gastroenterology;  Laterality: N/A;  . EYE SURGERY     glaucoma left   . HERNIA REPAIR    . IMAGE GUIDED SINUS SURGERY Bilateral 02/23/2018   Procedure: IMAGE GUIDED SINUS SURGERY Right ethmoidectomy with frontal exploration bilteral maxillary antrostomies Right sphenoidectomy;  Surgeon:  Clyde Canterbury, MD;  Location: [place];  Service: ENT;  Laterality: Bilateral;  NEED STRYKER DISK GAVE DISK TO CE[facility] 9-9  . UMBILICAL HERNIA REPAIR  2002    Family History  Problem Relation Age of Onset  . Diabetes Mother   . Diabetes Sister   . Heart disease Brother   . Diabetes Brother   . Heart disease Brother   . Diabetes Brother   . Diabetes Brother   . Diabetes Sister   . Diabetes Sister     Social History Social History   Tobacco Use  . Smoking status: Former Research scientist (life sciences)  . Smokeless tobacco: Never Used  . Tobacco comment: smoked some as teenager  Vaping Use  . Vaping Use: Never used  Substance Use Topics  . Alcohol use: No    Alcohol/week: 0.0 standard drinks  . Drug use: No    Allergies  Allergen Reactions  . Codeine   . Pioglitazone     REACTION: achy  . Sitagliptin Phosphate     REACTION: muscle aches  . Atorvastatin Other (See Comments)    myalgia  . Crestor [Rosuvastatin]     Muscle aching    Current Outpatient Medications  Medication Sig Dispense Refill  . bimatoprost (LUMIGAN) 0.01 % SOLN Place 1 drop into both eyes at bedtime.     . clobetasol cream (TEMOVATE) 0.05 % Apply to affected area on right lower leg 2  times a day for 2-4 weeks. Avoid face, groin, underarms. (Patient not taking: Reported on 08/16/2020) 30 g 1  . Continuous Blood Gluc Sensor (FREESTYLE LIBRE 14 DAY SENSOR) MISC 2 each by Other route every 14 (fourteen) days. 6 each 11  . empagliflozin (JARDIANCE) 25 MG TABS tablet Take 1 tablet (25 mg total) by mouth daily. 90 tablet 3  . glucose blood (ONE TOUCH ULTRA TEST) test strip USE ONE STRIP TO CHECK GLUCOSE bid 200 each 3  . insulin glargine (LANTUS SOLOSTAR) 100 UNIT/ML Solostar Pen Inject 22 Units into the skin daily. At night. If having fasting blood glucose readings less than 70 mg dl then reduce dose.APPT FURTHER REFILLS (Patient taking differently: Inject 22-24 Units into the skin at bedtime.)    . Insulin Pen Needle (PEN  NEEDLES) 31G X 6 MM MISC 1 Device by Does not apply route 3 (three) times daily as needed. 300 each 3  . Insulin Pen Needle (PEN NEEDLES) 32G X 4 MM MISC 1 Units by Does not apply route daily. Use 1 pen needle daily with Lantus 100 each 3  . metFORMIN (GLUCOPHAGE) 500 MG tablet 500 mg in am with food and 1000 mg qhs with food.CALL AND SCHEDULE APPT FURTHER REFILLS NO APPT SINCE 07/2018 (Patient taking differently: Take 500-1,000 mg by mouth See admin instructions. Take 500 mg by mouth in am with food and 1000 mg in the evening with food) 270 tablet 1  . Multiple Vitamin (MULTIVITAMIN) capsule Take 1 capsule by mouth daily.    Marland Kitchen omeprazole (PRILOSEC) 20 MG capsule Take 1 capsule (20 mg total) by mouth daily. 90 capsule 3  . ONETOUCH DELICA LANCETS FINE MISC Use to check blood sugar once a day. Dx Code E11.9 100 each 3  . timolol (TIMOPTIC) 0.5 % ophthalmic solution Place 1 drop into both eyes daily.     No current facility-administered medications for this visit.     Review of Systems Full ROS  was asked and was negative except for the information on the HPI  Physical Exam Blood pressure 137/84, pulse 60, temperature 98.2 F (36.8 C), temperature source Oral, height 5' 11.5" (1.816 m), weight 204 lb 3.2 oz (92.6 kg), SpO2 95 %. CONSTITUTIONAL: NAD EYES: Pupils are equal, round, and reactive to light, Sclera are non-icteric. EARS, NOSE, MOUTH AND THROAT: The oropharynx is clear. The oral mucosa is pink and moist. Hearing is intact to voice. LYMPH NODES:  Lymph nodes in the neck are normal. RESPIRATORY:  Lungs are clear. There is normal respiratory effort, with equal breath sounds bilaterally, and without pathologic use of accessory muscles. CARDIOVASCULAR: Heart is regular without murmurs, gallops, or rubs. GI: The abdomen is soft, nontender, and nondistended. There are no palpable masses. There is no hepatosplenomegaly. There are normal bowel sounds in all quadrants. GU: Rectal deferred.    MUSCULOSKELETAL: Normal muscle strength and tone. No cyanosis or edema.   SKIN: Turgor is good and there are no pathologic skin lesions or ulcers. NEUROLOGIC: Motor and sensation is grossly normal. Cranial nerves are grossly intact. PSYCH:  Oriented to person, place and time. Affect is normal.  Data Reviewed  I have personally reviewed the patient's imaging, laboratory findings and medical records.    Assessment/Plan 75 year old male with symptomatic paraesophageal hernia as well as reflux with a prior stricture due to persistent reflux disease.  Discussed with patient detail about his disease process.  I do think that repair paraesophageal hernia is certainly indicated.  Discussed with the  patient in detail about the procedure.  Risks, benefits and possible applications including but not limited to: Bleeding, infection, dysphagia, esophageal injuries, bowel injuries, pneumothorax.  He understands and wishes to proceed.  Extensive counseling provided.  Please note that I spent more than 45 minutes in this encounter with greater than 50% spent in coordination counseling of his care  Caroleen Hamman, MD FACS General Surgeon 08/21/2020, 7:22 AM

## 2020-08-21 NOTE — Progress Notes (Signed)
Patient ID: Alexis Sharp, male   DOB: October 12, 1945, 75 y.o.   MRN: [id number]  HPI Alexis Sharp is a 75 y.o. male well-known to me with a symptomatic paraesophageal hernia.  He does have reflux and chronic cough.  He did complete his work-up to include barium swallow, CT scan of the abdomen pelvis as well as an upper endoscopy.  Please note that I have personally reviewed all the images.  He does have a paraesophageal hernia type III with about a third of the stomach within the mediastinum.  He also underwent endoscopy showing evidence of a stricture that was dilated.   He is doing okay and denies any cardiac or pulmonary acute symptoms at this time.  No fevers no chills.  He is able to perform more than 4 METS of activity without any shortness of breath or chest pain.   HPI  Past Medical History:  Diagnosis Date  . Allergy    dust, grass   . Diabetes mellitus   . Glaucoma    [place] eye q 6 months   . Glaucoma   . Hyperlipidemia   . Migraines    rare   . Reflux esophagitis   . 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 Excised: 02/25/2016  . Wears hearing aid in both ears     Past Surgical History:  Procedure Laterality Date  . CATARACT EXTRACTION, BILATERAL    . COLONOSCOPY WITH PROPOFOL N/A 09/11/2017   Procedure: COLONOSCOPY WITH PROPOFOL;  Surgeon: Manya Silvas, MD;  Location: [facility] ENDOSCOPY;  Service: Endoscopy;  Laterality: N/A;  . ESOPHAGOGASTRODUODENOSCOPY (EGD) WITH PROPOFOL N/A 02/02/2018   Procedure: ESOPHAGOGASTRODUODENOSCOPY (EGD) WITH PROPOFOL;  Surgeon: [name], MD;  Location: [facility] ENDOSCOPY;  Service: Gastroenterology;  Laterality: N/A;  . EYE SURGERY     glaucoma left   . HERNIA REPAIR    . IMAGE GUIDED SINUS SURGERY Bilateral 02/23/2018   Procedure: IMAGE GUIDED SINUS SURGERY Right ethmoidectomy with frontal exploration bilteral maxillary antrostomies Right sphenoidectomy;  Surgeon:  Clyde Canterbury, MD;  Location: [place];  Service: ENT;  Laterality: Bilateral;  NEED STRYKER DISK GAVE DISK TO CECE 9-9  . UMBILICAL HERNIA REPAIR  2002    Family History  Problem Relation Age of Onset  . Diabetes Mother   . Diabetes Sister   . Heart disease Brother   . Diabetes Brother   . Heart disease Brother   . Diabetes Brother   . Diabetes Brother   . Diabetes Sister   . Diabetes Sister     Social History Social History   Tobacco Use  . Smoking status: Former Research scientist (life sciences)  . Smokeless tobacco: Never Used  . Tobacco comment: smoked some as teenager  Vaping Use  . Vaping Use: Never used  Substance Use Topics  . Alcohol use: No    Alcohol/week: 0.0 standard drinks  . Drug use: No    Allergies  Allergen Reactions  . Codeine   . Pioglitazone     REACTION: achy  . Sitagliptin Phosphate     REACTION: muscle aches  . Atorvastatin Other (See Comments)    myalgia  . Crestor [Rosuvastatin]     Muscle aching    Current Outpatient Medications  Medication Sig Dispense Refill  . bimatoprost (LUMIGAN) 0.01 % SOLN Place 1 drop into both eyes at bedtime.     . clobetasol cream (TEMOVATE) 0.05 % Apply to affected area on right lower leg 2  times a day for 2-4 weeks. Avoid face, groin, underarms. (Patient not taking: Reported on 08/16/2020) 30 g 1  . Continuous Blood Gluc Sensor (FREESTYLE LIBRE 14 DAY SENSOR) MISC 2 each by Other route every 14 (fourteen) days. 6 each 11  . empagliflozin (JARDIANCE) 25 MG TABS tablet Take 1 tablet (25 mg total) by mouth daily. 90 tablet 3  . glucose blood (ONE TOUCH ULTRA TEST) test strip USE ONE STRIP TO CHECK GLUCOSE bid 200 each 3  . insulin glargine (LANTUS SOLOSTAR) 100 UNIT/ML Solostar Pen Inject 22 Units into the skin daily. At night. If having fasting blood glucose readings less than 70 mg dl then reduce dose.APPT FURTHER REFILLS (Patient taking differently: Inject 22-24 Units into the skin at bedtime.)    . Insulin Pen Needle (PEN  NEEDLES) 31G X 6 MM MISC 1 Device by Does not apply route 3 (three) times daily as needed. 300 each 3  . Insulin Pen Needle (PEN NEEDLES) 32G X 4 MM MISC 1 Units by Does not apply route daily. Use 1 pen needle daily with Lantus 100 each 3  . metFORMIN (GLUCOPHAGE) 500 MG tablet 500 mg in am with food and 1000 mg qhs with food.CALL AND SCHEDULE APPT FURTHER REFILLS NO APPT SINCE 07/2018 (Patient taking differently: Take 500-1,000 mg by mouth See admin instructions. Take 500 mg by mouth in am with food and 1000 mg in the evening with food) 270 tablet 1  . Multiple Vitamin (MULTIVITAMIN) capsule Take 1 capsule by mouth daily.    Marland Kitchen omeprazole (PRILOSEC) 20 MG capsule Take 1 capsule (20 mg total) by mouth daily. 90 capsule 3  . ONETOUCH DELICA LANCETS FINE MISC Use to check blood sugar once a day. Dx Code E11.9 100 each 3  . timolol (TIMOPTIC) 0.5 % ophthalmic solution Place 1 drop into both eyes daily.     No current facility-administered medications for this visit.     Review of Systems Full ROS  was asked and was negative except for the information on the HPI  Physical Exam Blood pressure 137/84, pulse 60, temperature 98.2 F (36.8 C), temperature source Oral, height 5' 11.5" (1.816 m), weight 204 lb 3.2 oz (92.6 kg), SpO2 95 %. CONSTITUTIONAL: NAD EYES: Pupils are equal, round, and reactive to light, Sclera are non-icteric. EARS, NOSE, MOUTH AND THROAT: The oropharynx is clear. The oral mucosa is pink and moist. Hearing is intact to voice. LYMPH NODES:  Lymph nodes in the neck are normal. RESPIRATORY:  Lungs are clear. There is normal respiratory effort, with equal breath sounds bilaterally, and without pathologic use of accessory muscles. CARDIOVASCULAR: Heart is regular without murmurs, gallops, or rubs. GI: The abdomen is soft, nontender, and nondistended. There are no palpable masses. There is no hepatosplenomegaly. There are normal bowel sounds in all quadrants. GU: Rectal deferred.    MUSCULOSKELETAL: Normal muscle strength and tone. No cyanosis or edema.   SKIN: Turgor is good and there are no pathologic skin lesions or ulcers. NEUROLOGIC: Motor and sensation is grossly normal. Cranial nerves are grossly intact. PSYCH:  Oriented to person, place and time. Affect is normal.  Data Reviewed  I have personally reviewed the patient's imaging, laboratory findings and medical records.    Assessment/Plan 75 year old male with symptomatic paraesophageal hernia as well as reflux with a prior stricture due to persistent reflux disease.  Discussed with patient detail about his disease process.  I do think that repair paraesophageal hernia is certainly indicated.  Discussed with the  patient in detail about the procedure.  Risks, benefits and possible applications including but not limited to: Bleeding, infection, dysphagia, esophageal injuries, bowel injuries, pneumothorax.  He understands and wishes to proceed.  Extensive counseling provided.  Please note that I spent more than 45 minutes in this encounter with greater than 50% spent in coordination counseling of his care  Caroleen Hamman, MD FACS General Surgeon 08/21/2020, 7:22 AM

## 2020-08-21 NOTE — Telephone Encounter (Signed)
Notified patient per Dr Dahlia Byes his swallow study did show a hiatal hernia but no other surprises , so surgery is as scheduled.

## 2020-08-23 ENCOUNTER — Encounter
Admission: RE | Admit: 2020-08-23 | Discharge: 2020-08-23 | Disposition: A | Discharge status: Home / Self Care | Payer: PPO | Source: Ambulatory Visit | Attending: Surgery | Admitting: Surgery

## 2020-08-23 ENCOUNTER — Other Ambulatory Visit: Payer: Self-pay

## 2020-08-23 DIAGNOSIS — Z01818 Encounter for other preprocedural examination: Secondary | ICD-10-CM | POA: Insufficient documentation

## 2020-08-23 NOTE — Patient Instructions (Signed)
Your procedure is scheduled on: Thursday 08/30/20.  Report to THE FIRST FLOOR REGISTRATION DESK IN THE MEDICAL MALL ON THE MORNING OF SURGERY FIRST, THEN YOU WILL CHECK IN AT THE SURGERY INFORMATION DESK LOCATED OUTSIDE THE SAME DAY SURGERY DEPARTMENT LOCATED ON 2ND FLOOR MEDICAL MALL ENTRANCE.  To find out your arrival time please call [phone] between 1PM - 3PM on Wednesday    Remember: Instructions that are not followed completely may result in serious medical risk, up to and including death, or upon the discretion of your surgeon and anesthesiologist your surgery may need to be rescheduled.     __X__ 1. Do not eat food after midnight the night before your procedure.                 No gum chewing or hard candies. You may drink clear liquids up to 2 hours                 before you are scheduled to arrive for your surgery- DO NOT drink clear                 liquids within 2 hours of the start of your surgery.                 Clear Liquids include:  water, apple juice without pulp, clear carbohydrate                 drink such as Clearfast or Gatorade, Black Coffee or Tea (Do not add                 milk or creamer to coffee or tea).  __X__2.  On the morning of surgery brush your teeth with toothpaste and water, you may rinse your mouth with mouthwash if you wish.  Do not swallow any toothpaste or mouthwash.    __X__ 3.  No Alcohol for 24 hours before or after surgery.  __X__ 4.  Do Not Smoke or use e-cigarettes For 24 Hours Prior to Your Surgery.                 Do not use any chewable tobacco products for at least 6 hours prior to                 surgery.  __X__5.  Notify your doctor if there is any change in your medical condition      (cold, fever, infections).      Do NOT wear jewelry, make-up, hairpins, clips or nail polish. Do NOT wear lotions, powders, or perfumes.  Do NOT shave 48 hours prior to surgery. Men may shave face and neck. Do NOT bring valuables to the  hospital.     [facility] is not responsible for any belongings or valuables.   Contacts, dentures/partials or body piercings may not be worn into surgery. Bring a case for your contacts, glasses or hearing aids, a denture cup will be supplied.   Leave your suitcase in the car. After surgery it may be brought to your room.   For patients admitted to the hospital, discharge time is determined by your treatment team.    Patients discharged the day of surgery will not be allowed to drive home.     __X__ Take these medicines the morning of surgery with A SIP OF WATER:     1. omeprazole (PRILOSEC)      __X__ Use CHG Soap as directed.  __X__ Stop Metformin 2 days prior to  surgery. Your last dose will be on Monday 08/27/20.   __X__ Take 1/2 of usual insulin dose the night before surgery. No insulin the morning of surgery.   __X__ Stop Anti-inflammatories 7 days before surgery such as Advil, Ibuprofen, Motrin, BC or Goodies Powder, Naprosyn, Naproxen, Aleve, Aspirin, Meloxicam. May take Tylenol if needed for pain or discomfort.   __X__Do not start taking any new herbal supplements or vitamins prior to your procedure.  __X__ Stop the following herbal supplements or vitamins:  zinc gluconate   vitamin C   Wear comfortable clothing (specific to your surgery type) to the hospital.  Plan for stool softeners for home use; pain medications have a tendency to cause constipation. You can also help prevent constipation by eating foods high in fiber such as fruits and vegetables and drinking plenty of fluids as your diet allows.  After surgery, you can prevent lung complications by doing breathing exercises.Take deep breaths and cough every 1-2 hours. Your doctor may order a device called an Incentive Spirometer to help you take deep breaths.  Please call the [place] Department at [phone] if you have any questions about these instructions.

## 2020-08-28 ENCOUNTER — Other Ambulatory Visit: Payer: Self-pay

## 2020-08-28 ENCOUNTER — Other Ambulatory Visit
Admission: RE | Admit: 2020-08-28 | Discharge: 2020-08-28 | Disposition: A | Discharge status: Home / Self Care | Payer: PPO | Source: Ambulatory Visit | Attending: Surgery | Admitting: Surgery

## 2020-08-28 DIAGNOSIS — Z20822 Contact with and (suspected) exposure to covid-19: Secondary | ICD-10-CM | POA: Diagnosis present

## 2020-08-28 DIAGNOSIS — Z87891 Personal history of nicotine dependence: Secondary | ICD-10-CM | POA: Diagnosis not present

## 2020-08-28 DIAGNOSIS — R053 Chronic cough: Secondary | ICD-10-CM | POA: Diagnosis present

## 2020-08-28 DIAGNOSIS — I10 Essential (primary) hypertension: Secondary | ICD-10-CM | POA: Diagnosis present

## 2020-08-28 DIAGNOSIS — K449 Diaphragmatic hernia without obstruction or gangrene: Secondary | ICD-10-CM | POA: Diagnosis present

## 2020-08-28 DIAGNOSIS — Z8249 Family history of ischemic heart disease and other diseases of the circulatory system: Secondary | ICD-10-CM | POA: Diagnosis not present

## 2020-08-28 DIAGNOSIS — Z136 Encounter for screening for cardiovascular disorders: Secondary | ICD-10-CM | POA: Diagnosis not present

## 2020-08-28 DIAGNOSIS — K219 Gastro-esophageal reflux disease without esophagitis: Secondary | ICD-10-CM | POA: Diagnosis present

## 2020-08-28 DIAGNOSIS — E119 Type 2 diabetes mellitus without complications: Secondary | ICD-10-CM | POA: Diagnosis present

## 2020-08-28 DIAGNOSIS — K432 Incisional hernia without obstruction or gangrene: Secondary | ICD-10-CM | POA: Diagnosis present

## 2020-08-28 DIAGNOSIS — Z794 Long term (current) use of insulin: Secondary | ICD-10-CM | POA: Diagnosis not present

## 2020-08-28 DIAGNOSIS — Z888 Allergy status to other drugs, medicaments and biological substances status: Secondary | ICD-10-CM | POA: Diagnosis not present

## 2020-08-28 DIAGNOSIS — Z833 Family history of diabetes mellitus: Secondary | ICD-10-CM | POA: Diagnosis not present

## 2020-08-28 DIAGNOSIS — Z79899 Other long term (current) drug therapy: Secondary | ICD-10-CM | POA: Diagnosis not present

## 2020-08-28 DIAGNOSIS — G43909 Migraine, unspecified, not intractable, without status migrainosus: Secondary | ICD-10-CM | POA: Diagnosis present

## 2020-08-28 DIAGNOSIS — Z885 Allergy status to narcotic agent status: Secondary | ICD-10-CM | POA: Diagnosis not present

## 2020-08-28 DIAGNOSIS — Z01818 Encounter for other preprocedural examination: Secondary | ICD-10-CM | POA: Insufficient documentation

## 2020-08-28 LAB — CBC
HCT: 44.4 % (ref 39.0–52.0)
Hemoglobin: 15.1 g/dL (ref 13.0–17.0)
MCH: 32.7 pg (ref 26.0–34.0)
MCHC: 34 g/dL (ref 30.0–36.0)
MCV: 96.1 fL (ref 80.0–100.0)
Platelets: 209 10*3/uL (ref 150–400)
RBC: 4.62 MIL/uL (ref 4.22–5.81)
RDW: 12.5 % (ref 11.5–15.5)
WBC: 5.4 10*3/uL (ref 4.0–10.5)
nRBC: 0 % (ref 0.0–0.2)

## 2020-08-28 LAB — SARS CORONAVIRUS 2 (TAT 6-24 HRS): SARS Coronavirus 2: NEGATIVE

## 2020-08-28 LAB — BASIC METABOLIC PANEL
Anion gap: 10 (ref 5–15)
BUN: 17 mg/dL (ref 8–23)
CO2: 25 mmol/L (ref 22–32)
Calcium: 9 mg/dL (ref 8.9–10.3)
Chloride: 103 mmol/L (ref 98–111)
Creatinine, Ser: 0.93 mg/dL (ref 0.61–1.24)
GFR, Estimated: >60 mL/min (ref >60)
Glucose, Bld: 137 mg/dL — ABNORMAL HIGH (ref 70–99)
Potassium: 4 mmol/L (ref 3.5–5.1)
Sodium: 138 mmol/L (ref 135–145)

## 2020-08-29 MED ORDER — GABAPENTIN 300 MG PO CAPS: 300.0000 mg | ORAL_CAPSULE | ORAL | Status: AC

## 2020-08-29 MED ORDER — CELECOXIB 200 MG PO CAPS: 200.0000 mg | ORAL_CAPSULE | ORAL | Status: AC

## 2020-08-29 MED ORDER — CEFAZOLIN SODIUM-DEXTROSE 2-4 GM/100ML-% IV SOLN
2.0000 g | INTRAVENOUS | Status: AC
  Administered 2020-08-30: 2 g via INTRAVENOUS

## 2020-08-29 MED ORDER — CHLORHEXIDINE GLUCONATE CLOTH 2 % EX PADS: 6.0000 | MEDICATED_PAD | Freq: Once | CUTANEOUS | Status: DC

## 2020-08-29 MED ORDER — CHLORHEXIDINE GLUCONATE 0.12 % MT SOLN
15.0000 mL | Freq: Once | OROMUCOSAL | Status: AC
  Administered 2020-08-30: 15 mL via OROMUCOSAL

## 2020-08-29 MED ORDER — ACETAMINOPHEN 500 MG PO TABS: 1000.0000 mg | ORAL_TABLET | ORAL | Status: AC

## 2020-08-29 MED ORDER — SODIUM CHLORIDE 0.9 % IV SOLN: INTRAVENOUS | Status: DC

## 2020-08-29 MED ORDER — ORAL CARE MOUTH RINSE: 15.0000 mL | Freq: Once | OROMUCOSAL | Status: AC

## 2020-08-30 ENCOUNTER — Inpatient Hospital Stay: Payer: PPO | Admitting: Anesthesiology

## 2020-08-30 ENCOUNTER — Encounter
Admission: RE | Disposition: A | Discharge status: Home / Self Care | Payer: Self-pay | Source: Home / Self Care | Attending: Surgery

## 2020-08-30 ENCOUNTER — Other Ambulatory Visit: Payer: Self-pay | Admitting: Internal Medicine

## 2020-08-30 ENCOUNTER — Encounter: Payer: Self-pay | Admitting: Surgery

## 2020-08-30 ENCOUNTER — Inpatient Hospital Stay
Admission: RE | Admit: 2020-08-30 | Discharge: 2020-08-31 | DRG: 328 | Disposition: A | Discharge status: Home / Self Care | Payer: PPO | Attending: Surgery | Admitting: Surgery

## 2020-08-30 ENCOUNTER — Other Ambulatory Visit: Payer: Self-pay

## 2020-08-30 DIAGNOSIS — Z794 Long term (current) use of insulin: Secondary | ICD-10-CM

## 2020-08-30 DIAGNOSIS — K219 Gastro-esophageal reflux disease without esophagitis: Secondary | ICD-10-CM | POA: Diagnosis present

## 2020-08-30 DIAGNOSIS — K432 Incisional hernia without obstruction or gangrene: Secondary | ICD-10-CM

## 2020-08-30 DIAGNOSIS — I152 Hypertension secondary to endocrine disorders: Secondary | ICD-10-CM

## 2020-08-30 DIAGNOSIS — Z8249 Family history of ischemic heart disease and other diseases of the circulatory system: Secondary | ICD-10-CM

## 2020-08-30 DIAGNOSIS — Z888 Allergy status to other drugs, medicaments and biological substances status: Secondary | ICD-10-CM

## 2020-08-30 DIAGNOSIS — E1159 Type 2 diabetes mellitus with other circulatory complications: Secondary | ICD-10-CM

## 2020-08-30 DIAGNOSIS — K449 Diaphragmatic hernia without obstruction or gangrene: Secondary | ICD-10-CM | POA: Diagnosis not present

## 2020-08-30 DIAGNOSIS — Z87891 Personal history of nicotine dependence: Secondary | ICD-10-CM

## 2020-08-30 DIAGNOSIS — Z79899 Other long term (current) drug therapy: Secondary | ICD-10-CM

## 2020-08-30 DIAGNOSIS — Z833 Family history of diabetes mellitus: Secondary | ICD-10-CM

## 2020-08-30 DIAGNOSIS — Z8719 Personal history of other diseases of the digestive system: Secondary | ICD-10-CM

## 2020-08-30 DIAGNOSIS — E119 Type 2 diabetes mellitus without complications: Secondary | ICD-10-CM | POA: Diagnosis present

## 2020-08-30 DIAGNOSIS — Z20822 Contact with and (suspected) exposure to covid-19: Secondary | ICD-10-CM | POA: Diagnosis present

## 2020-08-30 DIAGNOSIS — I10 Essential (primary) hypertension: Secondary | ICD-10-CM | POA: Diagnosis present

## 2020-08-30 DIAGNOSIS — R053 Chronic cough: Secondary | ICD-10-CM | POA: Diagnosis present

## 2020-08-30 DIAGNOSIS — G43909 Migraine, unspecified, not intractable, without status migrainosus: Secondary | ICD-10-CM | POA: Diagnosis present

## 2020-08-30 DIAGNOSIS — Z885 Allergy status to narcotic agent status: Secondary | ICD-10-CM

## 2020-08-30 HISTORY — PX: XI ROBOTIC ASSISTED PARAESOPHAGEAL HERNIA REPAIR: SHX6871

## 2020-08-30 LAB — GLUCOSE, CAPILLARY
Glucose-Capillary: 170 mg/dL — ABNORMAL HIGH (ref 70–99)
Glucose-Capillary: 181 mg/dL — ABNORMAL HIGH (ref 70–99)
Glucose-Capillary: 189 mg/dL — ABNORMAL HIGH (ref 70–99)
Glucose-Capillary: 250 mg/dL — ABNORMAL HIGH (ref 70–99)

## 2020-08-30 LAB — CBC
HCT: 46.7 % (ref 39.0–52.0)
Hemoglobin: 15.7 g/dL (ref 13.0–17.0)
MCH: 33.1 pg (ref 26.0–34.0)
MCHC: 33.6 g/dL (ref 30.0–36.0)
MCV: 98.5 fL (ref 80.0–100.0)
Platelets: 168 10*3/uL (ref 150–400)
RBC: 4.74 MIL/uL (ref 4.22–5.81)
RDW: 12.6 % (ref 11.5–15.5)
WBC: 11.3 10*3/uL — ABNORMAL HIGH (ref 4.0–10.5)
nRBC: 0 % (ref 0.0–0.2)

## 2020-08-30 LAB — CREATININE, SERUM
Creatinine, Ser: 0.96 mg/dL (ref 0.61–1.24)
GFR, Estimated: >60 mL/min (ref >60)

## 2020-08-30 LAB — HEMOGLOBIN A1C
Hgb A1c MFr Bld: 6.8 % — ABNORMAL HIGH (ref 4.8–5.6)
Mean Plasma Glucose: 148.46 mg/dL

## 2020-08-30 SURGERY — REPAIR, HERNIA, PARAESOPHAGEAL, ROBOT-ASSISTED
Anesthesia: General

## 2020-08-30 MED ORDER — CHLORHEXIDINE GLUCONATE 0.12 % MT SOLN
OROMUCOSAL | Status: AC
  Filled 2020-08-30: qty 15

## 2020-08-30 MED ORDER — INSULIN ASPART 100 UNIT/ML ~~LOC~~ SOLN
0.0000 [IU] | Freq: Every day | SUBCUTANEOUS | Status: DC
  Administered 2020-08-30: 2 [IU] via SUBCUTANEOUS
  Filled 2020-08-30: qty 1

## 2020-08-30 MED ORDER — PANTOPRAZOLE SODIUM 40 MG IV SOLR
40.0000 mg | Freq: Every day | INTRAVENOUS | Status: DC
  Administered 2020-08-30: 40 mg via INTRAVENOUS
  Filled 2020-08-30: qty 40

## 2020-08-30 MED ORDER — DEXAMETHASONE SODIUM PHOSPHATE 10 MG/ML IJ SOLN
INTRAMUSCULAR | Status: DC | PRN
  Administered 2020-08-30: 5 mg via INTRAVENOUS

## 2020-08-30 MED ORDER — DEXAMETHASONE SODIUM PHOSPHATE 10 MG/ML IJ SOLN
INTRAMUSCULAR | Status: AC
  Filled 2020-08-30: qty 1

## 2020-08-30 MED ORDER — INSULIN ASPART 100 UNIT/ML ~~LOC~~ SOLN
0.0000 [IU] | Freq: Three times a day (TID) | SUBCUTANEOUS | Status: DC
  Administered 2020-08-31: 3 [IU] via SUBCUTANEOUS
  Filled 2020-08-30: qty 1

## 2020-08-30 MED ORDER — SUGAMMADEX SODIUM 200 MG/2ML IV SOLN
INTRAVENOUS | Status: DC | PRN
  Administered 2020-08-30: 200 mg via INTRAVENOUS

## 2020-08-30 MED ORDER — CEFAZOLIN SODIUM-DEXTROSE 2-4 GM/100ML-% IV SOLN
2.0000 g | Freq: Three times a day (TID) | INTRAVENOUS | Status: DC
  Administered 2020-08-30 – 2020-08-31 (×2): 2 g via INTRAVENOUS
  Filled 2020-08-30 (×3): qty 100

## 2020-08-30 MED ORDER — PHENYLEPHRINE HCL (PRESSORS) 10 MG/ML IV SOLN
INTRAVENOUS | Status: DC | PRN
  Administered 2020-08-30: 100 ug via INTRAVENOUS
  Administered 2020-08-30: 120 ug via INTRAVENOUS
  Administered 2020-08-30: 80 ug via INTRAVENOUS

## 2020-08-30 MED ORDER — KETOROLAC TROMETHAMINE 30 MG/ML IJ SOLN
INTRAMUSCULAR | Status: DC | PRN
  Administered 2020-08-30: 30 mg via INTRAVENOUS

## 2020-08-30 MED ORDER — FENTANYL CITRATE (PF) 100 MCG/2ML IJ SOLN
25.0000 ug | INTRAMUSCULAR | Status: DC | PRN
  Administered 2020-08-30 (×2): 25 ug via INTRAVENOUS

## 2020-08-30 MED ORDER — DEXMEDETOMIDINE HCL 200 MCG/2ML IV SOLN
INTRAVENOUS | Status: DC | PRN
  Administered 2020-08-30: 8 ug via INTRAVENOUS

## 2020-08-30 MED ORDER — KETOROLAC TROMETHAMINE 15 MG/ML IJ SOLN
15.0000 mg | Freq: Four times a day (QID) | INTRAMUSCULAR | Status: DC
  Administered 2020-08-30 – 2020-08-31 (×3): 15 mg via INTRAVENOUS
  Filled 2020-08-30 (×3): qty 1

## 2020-08-30 MED ORDER — PROCHLORPERAZINE EDISYLATE 10 MG/2ML IJ SOLN: 5.0000 mg | Freq: Four times a day (QID) | INTRAMUSCULAR | Status: DC | PRN

## 2020-08-30 MED ORDER — INSULIN ASPART 100 UNIT/ML ~~LOC~~ SOLN
4.0000 [IU] | Freq: Three times a day (TID) | SUBCUTANEOUS | Status: DC
  Administered 2020-08-31: 4 [IU] via SUBCUTANEOUS
  Filled 2020-08-30: qty 1

## 2020-08-30 MED ORDER — CEFAZOLIN SODIUM-DEXTROSE 2-4 GM/100ML-% IV SOLN
INTRAVENOUS | Status: AC
  Filled 2020-08-30: qty 100

## 2020-08-30 MED ORDER — DIPHENHYDRAMINE HCL 50 MG/ML IJ SOLN
12.5000 mg | Freq: Four times a day (QID) | INTRAMUSCULAR | Status: DC | PRN
Start: 2020-08-30 — End: 2020-08-31

## 2020-08-30 MED ORDER — ACETAMINOPHEN 325 MG PO TABS
325.0000 mg | ORAL_TABLET | ORAL | Status: DC | PRN
Start: 2020-08-30 — End: 2020-08-30

## 2020-08-30 MED ORDER — LIDOCAINE HCL (CARDIAC) PF 100 MG/5ML IV SOSY
PREFILLED_SYRINGE | INTRAVENOUS | Status: DC | PRN
  Administered 2020-08-30: 80 mg via INTRAVENOUS

## 2020-08-30 MED ORDER — BUPIVACAINE LIPOSOME 1.3 % IJ SUSP
INTRAMUSCULAR | Status: AC
  Filled 2020-08-30: qty 20

## 2020-08-30 MED ORDER — KETAMINE HCL 10 MG/ML IJ SOLN
INTRAMUSCULAR | Status: DC | PRN
  Administered 2020-08-30: 10 mg via INTRAVENOUS
  Administered 2020-08-30: 20 mg via INTRAVENOUS

## 2020-08-30 MED ORDER — PROCHLORPERAZINE MALEATE 10 MG PO TABS
10.0000 mg | ORAL_TABLET | Freq: Four times a day (QID) | ORAL | Status: DC | PRN
  Filled 2020-08-30: qty 1

## 2020-08-30 MED ORDER — PROMETHAZINE HCL 25 MG/ML IJ SOLN: 6.2500 mg | INTRAMUSCULAR | Status: DC | PRN

## 2020-08-30 MED ORDER — MORPHINE SULFATE (PF) 2 MG/ML IV SOLN: 2.0000 mg | INTRAVENOUS | Status: DC | PRN

## 2020-08-30 MED ORDER — METHOCARBAMOL 500 MG PO TABS: 500.0000 mg | ORAL_TABLET | Freq: Four times a day (QID) | ORAL | Status: DC | PRN

## 2020-08-30 MED ORDER — GABAPENTIN 300 MG PO CAPS
ORAL_CAPSULE | ORAL | Status: AC
  Administered 2020-08-30: 300 mg via ORAL
  Filled 2020-08-30: qty 1

## 2020-08-30 MED ORDER — GLYCOPYRROLATE 0.2 MG/ML IJ SOLN
INTRAMUSCULAR | Status: DC | PRN
  Administered 2020-08-30: .2 mg via INTRAVENOUS

## 2020-08-30 MED ORDER — ONDANSETRON 4 MG PO TBDP: 4.0000 mg | ORAL_TABLET | Freq: Four times a day (QID) | ORAL | Status: DC | PRN

## 2020-08-30 MED ORDER — KETAMINE HCL 50 MG/5ML IJ SOSY
PREFILLED_SYRINGE | INTRAMUSCULAR | Status: AC
  Filled 2020-08-30: qty 5

## 2020-08-30 MED ORDER — ROCURONIUM BROMIDE 10 MG/ML (PF) SYRINGE
PREFILLED_SYRINGE | INTRAVENOUS | Status: AC
  Filled 2020-08-30: qty 10

## 2020-08-30 MED ORDER — ACETAMINOPHEN 500 MG PO TABS
1000.0000 mg | ORAL_TABLET | Freq: Four times a day (QID) | ORAL | Status: DC
  Administered 2020-08-30 – 2020-08-31 (×2): 1000 mg via ORAL
  Filled 2020-08-30 (×2): qty 2

## 2020-08-30 MED ORDER — ENOXAPARIN SODIUM 40 MG/0.4ML ~~LOC~~ SOLN
40.0000 mg | SUBCUTANEOUS | Status: DC
  Administered 2020-08-31: 40 mg via SUBCUTANEOUS
  Filled 2020-08-30: qty 0.4

## 2020-08-30 MED ORDER — FENTANYL CITRATE (PF) 100 MCG/2ML IJ SOLN
INTRAMUSCULAR | Status: AC
  Filled 2020-08-30: qty 2

## 2020-08-30 MED ORDER — SORBITOL 70 % SOLN
30.0000 mL | Freq: Every day | Status: DC | PRN
  Filled 2020-08-30: qty 30

## 2020-08-30 MED ORDER — ROCURONIUM BROMIDE 100 MG/10ML IV SOLN
INTRAVENOUS | Status: DC | PRN
  Administered 2020-08-30 (×3): 20 mg via INTRAVENOUS
  Administered 2020-08-30: 60 mg via INTRAVENOUS
  Administered 2020-08-30: 20 mg via INTRAVENOUS
  Administered 2020-08-30: 40 mg via INTRAVENOUS

## 2020-08-30 MED ORDER — BUPIVACAINE-EPINEPHRINE (PF) 0.25% -1:200000 IJ SOLN
INTRAMUSCULAR | Status: AC
  Filled 2020-08-30: qty 30

## 2020-08-30 MED ORDER — DEXMEDETOMIDINE (PRECEDEX) IN NS 20 MCG/5ML (4 MCG/ML) IV SYRINGE
PREFILLED_SYRINGE | INTRAVENOUS | Status: AC
  Filled 2020-08-30: qty 5

## 2020-08-30 MED ORDER — ACETAMINOPHEN 160 MG/5ML PO SOLN
325.0000 mg | ORAL | Status: DC | PRN
Start: 2020-08-30 — End: 2020-08-30
  Filled 2020-08-30: qty 20.3

## 2020-08-30 MED ORDER — SODIUM CHLORIDE 0.9 % IV SOLN: INTRAVENOUS | Status: DC

## 2020-08-30 MED ORDER — BUPIVACAINE-EPINEPHRINE 0.25% -1:200000 IJ SOLN
INTRAMUSCULAR | Status: DC | PRN
  Administered 2020-08-30: 30 mL

## 2020-08-30 MED ORDER — FREESTYLE LIBRE 2 READER DEVI: 1.0000 | 11 refills | Status: DC

## 2020-08-30 MED ORDER — BUPIVACAINE LIPOSOME 1.3 % IJ SUSP
INTRAMUSCULAR | Status: DC | PRN
  Administered 2020-08-30: 20 mL

## 2020-08-30 MED ORDER — DIPHENHYDRAMINE HCL 12.5 MG/5ML PO ELIX
12.5000 mg | ORAL_SOLUTION | Freq: Four times a day (QID) | ORAL | Status: DC | PRN
  Filled 2020-08-30: qty 5

## 2020-08-30 MED ORDER — CELECOXIB 200 MG PO CAPS
ORAL_CAPSULE | ORAL | Status: AC
  Administered 2020-08-30: 200 mg via ORAL
  Filled 2020-08-30: qty 1

## 2020-08-30 MED ORDER — PROPOFOL 10 MG/ML IV BOLUS
INTRAVENOUS | Status: DC | PRN
  Administered 2020-08-30: 130 mg via INTRAVENOUS

## 2020-08-30 MED ORDER — FENTANYL CITRATE (PF) 100 MCG/2ML IJ SOLN
INTRAMUSCULAR | Status: DC | PRN
  Administered 2020-08-30 (×4): 50 ug via INTRAVENOUS

## 2020-08-30 MED ORDER — VISTASEAL 10 ML SINGLE DOSE KIT
PACK | CUTANEOUS | Status: AC
  Filled 2020-08-30: qty 10

## 2020-08-30 MED ORDER — PROPOFOL 10 MG/ML IV BOLUS
INTRAVENOUS | Status: AC
  Filled 2020-08-30: qty 20

## 2020-08-30 MED ORDER — ACETAMINOPHEN 500 MG PO TABS
ORAL_TABLET | ORAL | Status: AC
  Administered 2020-08-30: 1000 mg via ORAL
  Filled 2020-08-30: qty 2

## 2020-08-30 MED ORDER — SODIUM CHLORIDE 0.9 % IV SOLN
INTRAVENOUS | Status: DC | PRN
  Administered 2020-08-30: 30 ug/min via INTRAVENOUS

## 2020-08-30 MED ORDER — ONDANSETRON HCL 4 MG/2ML IJ SOLN: 4.0000 mg | Freq: Four times a day (QID) | INTRAMUSCULAR | Status: DC | PRN

## 2020-08-30 MED ORDER — EMPAGLIFLOZIN 25 MG PO TABS
25.0000 mg | ORAL_TABLET | Freq: Every day | ORAL | Status: DC
Start: 2020-08-30 — End: 2020-08-31
  Administered 2020-08-31: 25 mg via ORAL
  Filled 2020-08-30 (×2): qty 1

## 2020-08-30 MED ORDER — CHLORHEXIDINE GLUCONATE CLOTH 2 % EX PADS
6.0000 | MEDICATED_PAD | Freq: Every day | CUTANEOUS | Status: DC
  Administered 2020-08-31: 6 via TOPICAL

## 2020-08-30 MED ORDER — POLYETHYLENE GLYCOL 3350 17 G PO PACK: 17.0000 g | PACK | Freq: Every day | ORAL | Status: DC | PRN

## 2020-08-30 MED ORDER — FREESTYLE LIBRE 2 SENSOR MISC: 1.0000 | 11 refills | Status: DC

## 2020-08-30 MED ORDER — KETOROLAC TROMETHAMINE 30 MG/ML IJ SOLN
INTRAMUSCULAR | Status: AC
  Filled 2020-08-30: qty 1

## 2020-08-30 MED ORDER — OXYCODONE HCL 5 MG PO TABS: 5.0000 mg | ORAL_TABLET | ORAL | Status: DC | PRN

## 2020-08-30 MED ORDER — EPHEDRINE SULFATE 50 MG/ML IJ SOLN
INTRAMUSCULAR | Status: DC | PRN
  Administered 2020-08-30: 10 mg via INTRAVENOUS

## 2020-08-30 MED ORDER — INSULIN GLARGINE 100 UNIT/ML ~~LOC~~ SOLN
22.0000 [IU] | Freq: Every day | SUBCUTANEOUS | Status: DC
  Administered 2020-08-30: 22 [IU] via SUBCUTANEOUS
  Filled 2020-08-30 (×2): qty 0.22

## 2020-08-30 SURGICAL SUPPLY — 62 items
ADH SKN CLS APL DERMABOND .7 (GAUZE/BANDAGES/DRESSINGS) ×2
APL LAPSCP 35 DL APL RGD (MISCELLANEOUS) ×1
APL PRP STRL LF DISP 70% ISPRP (MISCELLANEOUS) ×1
APPLICATOR VISTASEAL 35 (MISCELLANEOUS) ×2 IMPLANT
CANISTER SUCT 1200ML W/VALVE (MISCELLANEOUS) IMPLANT
CANNULA REDUC XI 12-8 STAPL (CANNULA) ×1
CANNULA REDUCER 12-8 DVNC XI (CANNULA) ×1 IMPLANT
CHLORAPREP W/TINT 26 (MISCELLANEOUS) ×2 IMPLANT
COVER WAND RF STERILE (DRAPES) ×2 IMPLANT
DECANTER SPIKE VIAL GLASS SM (MISCELLANEOUS) ×2 IMPLANT
DEFOGGER SCOPE WARMER CLEARIFY (MISCELLANEOUS) ×2 IMPLANT
DERMABOND ADVANCED (GAUZE/BANDAGES/DRESSINGS) ×2
DERMABOND ADVANCED .7 DNX12 (GAUZE/BANDAGES/DRESSINGS) ×2 IMPLANT
DRAPE 3/4 80X56 (DRAPES) IMPLANT
DRAPE ARM DVNC X/XI (DISPOSABLE) ×4 IMPLANT
DRAPE COLUMN DVNC XI (DISPOSABLE) ×1 IMPLANT
DRAPE DA VINCI XI ARM (DISPOSABLE) ×4
DRAPE DA VINCI XI COLUMN (DISPOSABLE) ×1
ELECT CAUTERY BLADE 6.4 (BLADE) ×2 IMPLANT
ELECT REM PT RETURN 9FT ADLT (ELECTROSURGICAL) ×2
ELECTRODE REM PT RTRN 9FT ADLT (ELECTROSURGICAL) ×1 IMPLANT
GLOVE SURG ENC MOIS LTX SZ7 (GLOVE) ×12 IMPLANT
GOWN STRL REUS W/ TWL LRG LVL3 (GOWN DISPOSABLE) ×5 IMPLANT
GOWN STRL REUS W/TWL LRG LVL3 (GOWN DISPOSABLE) ×10
GRASPER LAPSCPC 5X45 DSP (INSTRUMENTS) ×4 IMPLANT
HANDLE YANKAUER SUCT BULB TIP (MISCELLANEOUS) ×2 IMPLANT
IRRIGATION STRYKERFLOW (MISCELLANEOUS) IMPLANT
IRRIGATOR STRYKERFLOW (MISCELLANEOUS)
IV NS 1000ML (IV SOLUTION)
IV NS 1000ML BAXH (IV SOLUTION) IMPLANT
KIT PINK PAD W/HEAD ARE REST (MISCELLANEOUS) ×2
KIT PINK PAD W/HEAD ARM REST (MISCELLANEOUS) ×1 IMPLANT
KIT TURNOVER CYSTO (KITS) ×2 IMPLANT
LABEL OR SOLS (LABEL) ×2 IMPLANT
MANIFOLD NEPTUNE II (INSTRUMENTS) ×2 IMPLANT
MESH BIO-A 7X10 SYN MAT (Mesh General) ×2 IMPLANT
NEEDLE HYPO 22GX1.5 SAFETY (NEEDLE) ×2 IMPLANT
OBTURATOR OPTICAL STANDARD 8MM (TROCAR) ×1
OBTURATOR OPTICAL STND 8 DVNC (TROCAR) ×1
OBTURATOR OPTICALSTD 8 DVNC (TROCAR) ×1 IMPLANT
PACK LAP CHOLECYSTECTOMY (MISCELLANEOUS) ×2 IMPLANT
PENCIL ELECTRO HAND CTR (MISCELLANEOUS) ×2 IMPLANT
SEAL CANN UNIV 5-8 DVNC XI (MISCELLANEOUS) ×3 IMPLANT
SEAL XI 5MM-8MM UNIVERSAL (MISCELLANEOUS) ×3
SEALER VESSEL DA VINCI XI (MISCELLANEOUS) ×1
SEALER VESSEL EXT DVNC XI (MISCELLANEOUS) ×1 IMPLANT
SOLUTION ELECTROLUBE (MISCELLANEOUS) ×2 IMPLANT
SPONGE LAP 18X18 RF (DISPOSABLE) ×2 IMPLANT
STAPLER CANNULA SEAL DVNC XI (STAPLE) ×1 IMPLANT
STAPLER CANNULA SEAL XI (STAPLE) ×1
SUT ETHIBOND 0 MO6 C/R (SUTURE) ×4 IMPLANT
SUT MNCRL 4-0 (SUTURE) ×4
SUT MNCRL 4-0 27XMFL (SUTURE) ×2
SUT SILK 2 0 SH (SUTURE) ×8 IMPLANT
SUT VICRYL 0 AB UR-6 (SUTURE) ×4 IMPLANT
SUT VLOC 90 S/L VL9 GS22 (SUTURE) ×2 IMPLANT
SUTURE MNCRL 4-0 27XMF (SUTURE) ×2 IMPLANT
TAPE TRANSPORE STRL 2 31045 (GAUZE/BANDAGES/DRESSINGS) ×2 IMPLANT
TRAY FOLEY SLVR 16FR LF STAT (SET/KITS/TRAYS/PACK) ×2 IMPLANT
TROCAR BALLN GELPORT 12X130M (ENDOMECHANICALS) ×2 IMPLANT
TROCAR XCEL NON-BLD 5MMX100MML (ENDOMECHANICALS) ×2 IMPLANT
TUBING EVAC SMOKE HEATED PNEUM (TUBING) ×2 IMPLANT

## 2020-08-30 NOTE — Anesthesia Procedure Notes (Signed)
Procedure Name: Intubation Date/Time: 08/30/2020 12:41 PM Performed by: Allean Found, CRNA Pre-anesthesia Checklist: Patient identified, Patient being monitored, Timeout performed, Emergency Drugs available and Suction available Patient Re-evaluated:Patient Re-evaluated prior to induction Oxygen Delivery Method: Circle system utilized Preoxygenation: Pre-oxygenation with 100% oxygen Induction Type: IV induction Ventilation: Mask ventilation without difficulty Laryngoscope Size: McGraph and 4 Grade View: Grade II Tube type: Oral Tube size: 7.5 mm Number of attempts: 1 Airway Equipment and Method: Stylet Placement Confirmation: ETT inserted through vocal cords under direct vision,  positive ETCO2 and breath sounds checked- equal and bilateral Secured at: 21 cm Tube secured with: Tape Dental Injury: Teeth and Oropharynx as per pre-operative assessment

## 2020-08-30 NOTE — Transfer of Care (Signed)
Immediate Anesthesia Transfer of Care Note  Patient: Alexis Sharp  Procedure(s) Performed: XI ROBOTIC ASSISTED PARAESOPHAGEAL HERNIA REPAIR with Adrianne Allred, RNFA to assist (N/A )  Patient Location: PACU  Anesthesia Type:General  Level of Consciousness: drowsy  Airway & Oxygen Therapy: Patient Spontanous Breathing and Patient connected to face mask oxygen  Post-op Assessment: Report given to RN and Post -op Vital signs reviewed and stable  Post vital signs: Reviewed and stable  Last Vitals:  Vitals Value Taken Time  BP 119/79 08/30/20 1600  Temp    Pulse 82 08/30/20 1602  Resp 20 08/30/20 1602  SpO2 98 % 08/30/20 1602  Vitals shown include unvalidated device data.  Last Pain:  Vitals:   08/30/20 1019  TempSrc: Tympanic  PainSc: 0-No pain      Patients Stated Pain Goal: 0 (89/84/21 0312)  Complications: No complications documented.

## 2020-08-30 NOTE — Anesthesia Postprocedure Evaluation (Signed)
Anesthesia Post Note  Patient: Alexis Sharp  Procedure(s) Performed: XI ROBOTIC ASSISTED PARAESOPHAGEAL HERNIA REPAIR with Adrianne Allred, RNFA to assist (N/A )  Patient location during evaluation: PACU Anesthesia Type: General Level of consciousness: awake and alert Pain management: pain level controlled Vital Signs Assessment: post-procedure vital signs reviewed and stable Respiratory status: spontaneous breathing and respiratory function stable Cardiovascular status: stable Anesthetic complications: no   No complications documented.   Last Vitals:  Vitals:   08/30/20 1615 08/30/20 1630  BP: 134/81 131/78  Pulse: 78 77  Resp: 17 15  Temp:    SpO2: 93% 92%    Last Pain:  Vitals:   08/30/20 1630  TempSrc:   PainSc: 0-No pain                 [name]

## 2020-08-30 NOTE — Interval H&P Note (Signed)
History and Physical Interval Note:  08/30/2020 11:38 AM  Alexis Sharp  has presented today for surgery, with the diagnosis of paraesophageal hernia.  The various methods of treatment have been discussed with the patient and family. After consideration of risks, benefits and other options for treatment, the patient has consented to  Procedure(s): XI ROBOTIC ASSISTED PARAESOPHAGEAL HERNIA REPAIR with Adrianne Allred, RNFA to assist (N/A) as a surgical intervention.  The patient's history has been reviewed, patient examined, no change in status, stable for surgery.  I have reviewed the patient's chart and labs.  Questions were answered to the patient's satisfaction.     [place]

## 2020-08-30 NOTE — Anesthesia Preprocedure Evaluation (Addendum)
Anesthesia Evaluation  Patient identified by MRN, date of birth, ID band Patient awake    Reviewed: Allergy & Precautions, H&P , NPO status , reviewed documented beta blocker date and time   Airway Mallampati: II  TM Distance: >3 FB Neck ROM: limited    Dental  (+) Teeth Intact, Chipped, Missing   Pulmonary former smoker,    Pulmonary exam normal        Cardiovascular hypertension, (-) Orthopnea Normal cardiovascular exam     Neuro/Psych  Headaches,  Neuromuscular disease    GI/Hepatic GERD  Medicated and Controlled,  Endo/Other  diabetes  Renal/GU      Musculoskeletal   Abdominal   Peds  Hematology   Anesthesia Other Findings Past Medical History: No date: Allergy     Comment:  dust, grass  No date: Diabetes mellitus No date: Glaucoma     Comment:  [place] eye q 6 months  No date: Glaucoma No date: Hyperlipidemia No date: Migraines     Comment:  rare  No date: Reflux esophagitis 01/04/2016: Syringoma     Comment:  Mid sternum. Adnexal neoplasm consistent with syringoma               with atypia, margin involved. Excised: 02/25/2016 No date: Wears hearing aid in both ears Past Surgical History: No date: CATARACT EXTRACTION, BILATERAL 09/11/2017: COLONOSCOPY WITH PROPOFOL; N/A     Comment:  Procedure: COLONOSCOPY WITH PROPOFOL;  Surgeon: Manya Silvas, MD;  Location: [facility] ENDOSCOPY;  Service:               Endoscopy;  Laterality: N/A; 02/02/2018: ESOPHAGOGASTRODUODENOSCOPY (EGD) WITH PROPOFOL; N/A     Comment:  Procedure: ESOPHAGOGASTRODUODENOSCOPY (EGD) WITH               PROPOFOL;  Surgeon: [name], MD;  Location: [facility]               ENDOSCOPY;  Service: Gastroenterology;  Laterality: N/A; No date: EYE SURGERY     Comment:  glaucoma left  No date: HERNIA REPAIR 02/23/2018: IMAGE GUIDED SINUS SURGERY; Bilateral     Comment:  Procedure: IMAGE GUIDED SINUS SURGERY Right                ethmoidectomy with frontal exploration bilteral maxillary              antrostomies Right sphenoidectomy;  Surgeon: Clyde Canterbury, MD;  Location: [place];  Service: ENT;               Laterality: Bilateral;  NEED STRYKER DISK GAVE DISK TO               CECE 9-9 0539: UMBILICAL HERNIA REPAIR   Reproductive/Obstetrics                            Anesthesia Physical Anesthesia Plan  ASA: II  Anesthesia Plan: General   Post-op Pain Management:    Induction: Intravenous  PONV Risk Score and Plan: 3 and Ondansetron, Midazolam, Treatment may vary due to age or medical condition and Dexamethasone  Airway Management Planned: Oral ETT  Additional Equipment:   Intra-op Plan:   Post-operative Plan: Extubation in OR  Informed Consent: I have reviewed the patients History and Physical, chart, labs and discussed the procedure  including the risks, benefits and alternatives for the proposed anesthesia with the patient or authorized representative who has indicated his/her understanding and acceptance.     Dental Advisory Given  Plan Discussed with: CRNA  Anesthesia Plan Comments:         Anesthesia Quick Evaluation

## 2020-08-30 NOTE — Progress Notes (Signed)
This 75 yo white male pt was admitted to 212 from the PACU s/p paresophageal hernia type III repair, mesh placed. VSS on arrival and pt without complaints of pain.  Foley catheter intact and SCD's on.  Initial assessment completed. Pt's wife present at the time of arrival.

## 2020-08-30 NOTE — Op Note (Signed)
Robotic assisted laparoscopic repair of paraesophageal hernia with Mesh ( Bio-A [id number]) Nissen fundoplication   Pre-operative Diagnosis: GERD, paraesophageal hernia  Post-operative Diagnosis: same  Procedure:  Robotic assisted laparoscopic Paraesophageal hernia repair w mesh and  Nissen fundoplication Incisional hernia primary repair     Surgeon: Caroleen Hamman, MD FACS  Assistant: Mr. Freda Jackson. Required due to the complexity of the case the need for exposure and lack of first assist.  Anesthesia: Gen. with endotracheal tube  Findings: Type III paraesophageal hernia , large  Restoration of the Ge junction to its Intra-abdominal position Loose wrap 360 degree over 50 FR Bougie Periumbilical 2 cm incisional hernia w prior round mesh, recurrence on inferior edge  Estimated Blood Loss: 82UM       Complications: none   Procedure Details  The patient was seen again in the Holding Room. The benefits, complications, treatment options, and expected outcomes were discussed with the patient. The risks of bleeding, infection, recurrence of symptoms, failure to resolve symptoms,  esophageal damage, Dysphagia, bowel injury, any of which could require further surgery were reviewed with the patient. The likelihood of improving the patient's symptoms with return to their baseline status is good.  The patient and/or family concurred with the proposed plan, giving informed consent.  The patient was taken to Operating Room, identified  and the procedure verified.  A Time Out was held and the above information confirmed.  Prior to the induction of general anesthesia, antibiotic prophylaxis was administered. VTE prophylaxis was in place. General endotracheal anesthesia was then administered and tolerated well. After the induction, the abdomen was prepped with Chloraprep and draped in the sterile fashion. The patient was positioned in the supine position.  Cut down technique was used to enter the  abdominal cavity and a Hasson trochar was placed after two vicryl stitches were anchored to the fascia. Pneumoperitoneum was then created with CO2 and tolerated well without any adverse changes in the patient's vital signs.  Three 8-mm ports were placed under direct vision. All skin incisions  were infiltrated with a local anesthetic agent before making the incision and placing the trocars. An additional 5 mm regular laparoscopic port was placed to assist with retraction and exposure.   The patient was positioned  in reverse Trendelenburg, robot was brought to the surgical field and docked in the standard fashion.  We made sure all the instrumentation was kept indirect view at all times and that there were no collision between the arms. I scrubbed out and went to the console.  I used Robotic arm to retract the liver, the vessel sealer on my right hand and a forced bipolar grasper on my left hand.  There is along the extra 5 mm port allow me ample exposure and the ability to perform meticulous dissection  We Started dividing the lesser omentum via the pars flaccida.  We Were able to dissect the lesser curvature of the stomach and  dissected the fundus free from the right and left crus.  We circumferentially dissected the GE junction.  The hernia sac was also completely reduced and we were able to bring the stomach into the intra-abdominal position.  Attention then was turned to the greater curvature where the short gastrics were divided with sealer device.  We were able to identify the left crus and again were able to make sure there was a good circumferential dissection and that the hernia sac was completely excised.  We did perform a nice dissection within the mediastinum to  allow a complete reduction of the sac and a to completely allow an intra-abdominal Nissen fundoplication. We preserved the anterior and posterior vagus nerves. No evidence of esophageal or vascular injuries observed  2-0V lock suture  was inserted and the crus  was closed with a running V lock suture using strips of Bio-A as a pledged. I inserted the BioA Mesh and secured it to the posterior portion of the diaphragm, attached to the crus w v lock and using vistaseal.   We Asked anesthesia to place a 50 French bougie and this went easily.  We also observe trajectory of the bougie. 360 degree Nissen fundoplication was created with multiple 2-0 silk sutures and we placed 3 stitches taking some of the esophagus within that bite.  The fundoplication measured approximately 3-1/2 cm and he was floppy. I was very happy with the way the fundoplication laid and the repair of the hernia.  Inspection of the  upper quadrant was performed. No bleeding, bile  Or esophageal injuries leaks, or bowel injuries were noted. Robotic instruments and robotic arms were undocked in the standard fashion. All the needles were removed under direct visualization.   I scrubbed back in. We visualized An incisional hernia and hernia sac was excised. Using multiple interrupted 0 ethibond sutures we closed the hernia defect , there was a prior mesh and we were able to secure the healthy fascia to the mesh.   Pneumoperitoneum was released.  The periumbilical port site was closed with interrumpted 0 Vicryl sutures. 4-0 subcuticular Monocryl was used to close the skin. Liposomal marcaine was injected to all the incisions sites.  Dermabond was  applied.  The patient was then extubated and brought to the recovery room in stable condition. Sponge, lap, and needle counts were correct at closure and at the conclusion of the case.               Caroleen Hamman, MD, FACS

## 2020-08-31 ENCOUNTER — Encounter: Payer: Self-pay | Admitting: Surgery

## 2020-08-31 DIAGNOSIS — Z20822 Contact with and (suspected) exposure to covid-19: Secondary | ICD-10-CM | POA: Diagnosis present

## 2020-08-31 DIAGNOSIS — K219 Gastro-esophageal reflux disease without esophagitis: Secondary | ICD-10-CM | POA: Diagnosis present

## 2020-08-31 DIAGNOSIS — G43909 Migraine, unspecified, not intractable, without status migrainosus: Secondary | ICD-10-CM | POA: Diagnosis present

## 2020-08-31 DIAGNOSIS — Z79899 Other long term (current) drug therapy: Secondary | ICD-10-CM | POA: Diagnosis not present

## 2020-08-31 DIAGNOSIS — Z794 Long term (current) use of insulin: Secondary | ICD-10-CM | POA: Diagnosis not present

## 2020-08-31 DIAGNOSIS — R053 Chronic cough: Secondary | ICD-10-CM | POA: Diagnosis present

## 2020-08-31 DIAGNOSIS — Z8249 Family history of ischemic heart disease and other diseases of the circulatory system: Secondary | ICD-10-CM | POA: Diagnosis not present

## 2020-08-31 DIAGNOSIS — Z888 Allergy status to other drugs, medicaments and biological substances status: Secondary | ICD-10-CM | POA: Diagnosis not present

## 2020-08-31 DIAGNOSIS — K449 Diaphragmatic hernia without obstruction or gangrene: Secondary | ICD-10-CM | POA: Diagnosis present

## 2020-08-31 DIAGNOSIS — Z833 Family history of diabetes mellitus: Secondary | ICD-10-CM | POA: Diagnosis not present

## 2020-08-31 DIAGNOSIS — E119 Type 2 diabetes mellitus without complications: Secondary | ICD-10-CM | POA: Diagnosis present

## 2020-08-31 DIAGNOSIS — I10 Essential (primary) hypertension: Secondary | ICD-10-CM | POA: Diagnosis present

## 2020-08-31 DIAGNOSIS — Z87891 Personal history of nicotine dependence: Secondary | ICD-10-CM | POA: Diagnosis not present

## 2020-08-31 DIAGNOSIS — K432 Incisional hernia without obstruction or gangrene: Secondary | ICD-10-CM | POA: Diagnosis present

## 2020-08-31 DIAGNOSIS — Z885 Allergy status to narcotic agent status: Secondary | ICD-10-CM | POA: Diagnosis not present

## 2020-08-31 LAB — BASIC METABOLIC PANEL
Anion gap: 8 (ref 5–15)
BUN: 16 mg/dL (ref 8–23)
CO2: 23 mmol/L (ref 22–32)
Calcium: 8.4 mg/dL — ABNORMAL LOW (ref 8.9–10.3)
Chloride: 105 mmol/L (ref 98–111)
Creatinine, Ser: 0.91 mg/dL (ref 0.61–1.24)
GFR, Estimated: >60 mL/min (ref >60)
Glucose, Bld: 191 mg/dL — ABNORMAL HIGH (ref 70–99)
Potassium: 4.1 mmol/L (ref 3.5–5.1)
Sodium: 136 mmol/L (ref 135–145)

## 2020-08-31 LAB — GLUCOSE, CAPILLARY: Glucose-Capillary: 190 mg/dL — ABNORMAL HIGH (ref 70–99)

## 2020-08-31 MED ORDER — METHOCARBAMOL 500 MG PO TABS: 500.0000 mg | ORAL_TABLET | Freq: Four times a day (QID) | ORAL | 0 refills | Status: DC | PRN

## 2020-08-31 MED ORDER — OXYCODONE HCL 5 MG PO TABS: 5.0000 mg | ORAL_TABLET | ORAL | 0 refills | Status: DC | PRN

## 2020-08-31 NOTE — Discharge Summary (Signed)
[facility] SURGICAL DISCHARGE SUMMARY  Patient ID: Alexis Sharp MRN: [id number] DOB/AGE: 73-Jul-1947 73 y.o.  Admit date: 08/30/2020 Discharge date: 08/31/2020  Discharge Diagnoses Patient Active Problem List   Diagnosis Date Noted  . S/P repair of paraesophageal hernia 08/30/2020   Consultants None  Procedures 08/30/2020: 1) Robotic assisted laparoscopic Paraesophageal hernia repair w mesh and  Nissen fundoplication 2) Incisional hernia primary repair    HPI: Alexis Sharp is a 75 y.o. male with a history of symptomatic paraesophageal hernia who presents to [facility] on 03/17 for planned procedure  Hospital Course: Informed consent was obtained and documented, and patient underwent uneventful robotic assisted laparoscopic paraesophageal hernia repair and incisional hernia repair (Dr Dahlia Byes, 08/30/2020).  Post-operatively, patient did well and advancement of patient's diet and ambulation were well-tolerated. The remainder of patient's hospital course was essentially unremarkable, and discharge planning was initiated accordingly with patient safely able to be discharged home with appropriate discharge instructions, pain control, and outpatient follow-up after all of his questions were answered to his expressed satisfaction.   Discharge Condition: Good   Physical Examination:  Constitutional: Well appearing male, NAD Pulmonary: Normal effort, no respiratory distress Gastrointestinal: Soft, mild (if any) incisional tenderness, non-distended, no rebound/guarding Skin: Laparoscopic incisions are CDI with dermabond, no erythema or drainage    Allergies as of 08/31/2020      Reactions   Codeine    Pioglitazone    REACTION: achy   Sitagliptin Phosphate    REACTION: muscle aches   Atorvastatin Other (See Comments)   myalgia   Crestor [rosuvastatin]    Muscle aching      Medication List    STOP taking these medications   clobetasol cream 0.05 % Commonly known  as: TEMOVATE   zinc gluconate 50 MG tablet     TAKE these medications   bimatoprost 0.01 % Soln Commonly known as: LUMIGAN Place 1 drop into both eyes at bedtime.   empagliflozin 25 MG Tabs tablet Commonly known as: JARDIANCE Take 1 tablet (25 mg total) by mouth daily.   FreeStyle Libre 2 Reader Devi 1 Device by Does not apply route every 14 (fourteen) days.   FreeStyle Libre 2 Sensor Misc 1 Device by Does not apply route every 14 (fourteen) days.   glucose blood test strip Commonly known as: ONE TOUCH ULTRA TEST USE ONE STRIP TO CHECK GLUCOSE bid   Lantus SoloStar 100 UNIT/ML Solostar Pen Generic drug: insulin glargine Inject 22 Units into the skin daily. At night. If having fasting blood glucose readings less than 70 mg dl then reduce dose.APPT FURTHER REFILLS What changed:   how much to take  when to take this  additional instructions   metFORMIN 500 MG tablet Commonly known as: GLUCOPHAGE 500 mg in am with food and 1000 mg qhs with food.CALL AND SCHEDULE APPT FURTHER REFILLS NO APPT SINCE 07/2018 What changed:   how much to take  how to take this  when to take this  additional instructions   methocarbamol 500 MG tablet Commonly known as: ROBAXIN Take 1 tablet (500 mg total) by mouth every 6 (six) hours as needed for muscle spasms.   multivitamin capsule Take 1 capsule by mouth daily.   omeprazole 20 MG capsule Commonly known as: PRILOSEC Take 1 capsule (20 mg total) by mouth daily.   OneTouch Delica Lancets Fine Misc Use to check blood sugar once a day. Dx Code E11.9   oxyCODONE 5 MG immediate release tablet Commonly known as: Oxy  IR/ROXICODONE Take 1-2 tablets (5-10 mg total) by mouth every 4 (four) hours as needed for moderate pain.   Pen Needles 32G X 4 MM Misc 1 Units by Does not apply route daily. Use 1 pen needle daily with Lantus   Pen Needles 31G X 6 MM Misc 1 Device by Does not apply route 3 (three) times daily as needed.   timolol  0.5 % ophthalmic solution Commonly known as: TIMOPTIC Place 1 drop into both eyes daily.   vitamin C 500 MG tablet Commonly known as: ASCORBIC ACID Take 500 mg by mouth daily.         Follow-up Information    P[place]on, [place] F, MD. Schedule an appointment as soon as possible for a visit in 2 week(s).   Specialty: General Surgery Why: s/p paraesophageal hernia repair  Contact information: [address] [place] [address] [phone]                Time spent on discharge management including discussion of hospital course, clinical condition, outpatient instructions, prescriptions, and follow up with the patient and members of the medical team: >30 minutes  -- Edison Simon , PA-C [facility]  08/31/2020, 9:46 AM [phone] M-F: 7am - 4pm

## 2020-08-31 NOTE — Discharge Instructions (Signed)
In addition to included general post-operative instructions,  Diet: Recommend following Nissen diet recommendations for at least 4 weeks. I have provided you a hand out for this.    Activity: No heavy lifting >20 pounds (children, pets, laundry, garbage) 4 weeks, but light activity and walking are encouraged. Do not drive or drink alcohol if taking narcotic pain medications or having pain that might distract from driving.  Wound care: 2 days after surgery (03/19), you may shower/get incision wet with soapy water and pat dry (do not rub incisions), but no baths or submerging incision underwater until follow-up.   Medications: Resume all home medications. For mild to moderate pain: acetaminophen (Tylenol) or ibuprofen/naproxen (if no kidney disease). Combining Tylenol with alcohol can substantially increase your risk of causing liver disease. Narcotic pain medications, if prescribed, can be used for severe pain, though may cause nausea, constipation, and drowsiness. Do not combine Tylenol and Percocet (or similar) within a 6 hour period as Percocet (and similar) contain(s) Tylenol. If you do not need the narcotic pain medication, you do not need to fill the prescription.  Call office [phone] / [phone]) at any time if any questions, worsening pain, fevers/chills, bleeding, drainage from incision site, or other concerns.

## 2020-08-31 NOTE — Plan of Care (Signed)
  Problem: Education: Goal: Knowledge of General Education information will improve Description: Including pain rating scale, medication(s)/side effects and non-pharmacologic comfort measures 08/31/2020 1217 by Vivien Rota, RN Outcome: Adequate for Discharge 08/31/2020 1051 by [place], Winifred Olive, RN Outcome: Progressing   Problem: Health Behavior/Discharge Planning: Goal: Ability to manage health-related needs will improve 08/31/2020 1217 by Vivien Rota, RN Outcome: Adequate for Discharge 08/31/2020 1051 by Vivien Rota, RN Outcome: Progressing   Problem: Clinical Measurements: Goal: Ability to maintain clinical measurements within normal limits will improve 08/31/2020 1217 by Vivien Rota, RN Outcome: Adequate for Discharge 08/31/2020 1051 by Vivien Rota, RN Outcome: Progressing Goal: Will remain free from infection 08/31/2020 1217 by Vivien Rota, RN Outcome: Adequate for Discharge 08/31/2020 1051 by Vivien Rota, RN Outcome: Progressing Goal: Diagnostic test results will improve 08/31/2020 1217 by Vivien Rota, RN Outcome: Adequate for Discharge 08/31/2020 1051 by Vivien Rota, RN Outcome: Progressing Goal: Respiratory complications will improve 08/31/2020 1217 by Vivien Rota, RN Outcome: Adequate for Discharge 08/31/2020 1051 by Vivien Rota, RN Outcome: Progressing Goal: Cardiovascular complication will be avoided 08/31/2020 1217 by Vivien Rota, RN Outcome: Adequate for Discharge 08/31/2020 1051 by Vivien Rota, RN Outcome: Progressing   Problem: Activity: Goal: Risk for activity intolerance will decrease 08/31/2020 1217 by Vivien Rota, RN Outcome: Adequate for Discharge 08/31/2020 1051 by Vivien Rota, RN Outcome: Progressing   Problem: Nutrition: Goal: Adequate nutrition will be maintained 08/31/2020 1217 by Vivien Rota, RN Outcome:  Adequate for Discharge 08/31/2020 1051 by Vivien Rota, RN Outcome: Progressing   Problem: Coping: Goal: Level of anxiety will decrease 08/31/2020 1217 by Vivien Rota, RN Outcome: Adequate for Discharge 08/31/2020 1051 by Vivien Rota, RN Outcome: Progressing   Problem: Elimination: Goal: Will not experience complications related to bowel motility 08/31/2020 1217 by Vivien Rota, RN Outcome: Adequate for Discharge 08/31/2020 1051 by Vivien Rota, RN Outcome: Progressing Goal: Will not experience complications related to urinary retention 08/31/2020 1217 by Vivien Rota, RN Outcome: Adequate for Discharge 08/31/2020 1051 by Vivien Rota, RN Outcome: Progressing   Problem: Pain Managment: Goal: General experience of comfort will improve 08/31/2020 1217 by Vivien Rota, RN Outcome: Adequate for Discharge 08/31/2020 1051 by Vivien Rota, RN Outcome: Progressing   Problem: Safety: Goal: Ability to remain free from injury will improve 08/31/2020 1217 by Vivien Rota, RN Outcome: Adequate for Discharge 08/31/2020 1051 by Vivien Rota, RN Outcome: Progressing   Problem: Skin Integrity: Goal: Risk for impaired skin integrity will decrease 08/31/2020 1217 by Vivien Rota, RN Outcome: Adequate for Discharge 08/31/2020 1051 by Vivien Rota, RN Outcome: Progressing

## 2020-08-31 NOTE — Progress Notes (Signed)
Patient discharged via wheelchair.  Reviewed discharge instructions.  IV sites benign.  Foley removed and has not voided at this time - aware to notify if has not voided in 6- 8 hours

## 2020-08-31 NOTE — Plan of Care (Signed)

## 2020-09-03 ENCOUNTER — Encounter: Payer: Self-pay | Admitting: Physician Assistant

## 2020-09-03 ENCOUNTER — Ambulatory Visit (INDEPENDENT_AMBULATORY_CARE_PROVIDER_SITE_OTHER): Payer: PPO | Admitting: Physician Assistant

## 2020-09-03 ENCOUNTER — Other Ambulatory Visit: Payer: Self-pay

## 2020-09-03 VITALS — BP 140/89 | HR 76 | Temp 98.2°F | Ht 71.5 in | Wt 197.0 lb

## 2020-09-03 DIAGNOSIS — Z09 Encounter for follow-up examination after completed treatment for conditions other than malignant neoplasm: Secondary | ICD-10-CM

## 2020-09-03 DIAGNOSIS — K449 Diaphragmatic hernia without obstruction or gangrene: Secondary | ICD-10-CM

## 2020-09-03 MED ORDER — AMOXICILLIN-POT CLAVULANATE 875-125 MG PO TABS: 1.0000 | ORAL_TABLET | Freq: Two times a day (BID) | ORAL | 0 refills | Status: AC

## 2020-09-03 NOTE — Progress Notes (Signed)
[facility] POST-OP OFFICE VISIT  09/03/2020  HPI: Alexis Sharp is a 75 y.o. male 4 days s/p robotic assisted laparoscopicParaesophageal hernia repair with mesh andNissen fundoplication with Dr Dahlia Byes.   He reports that over the weekend, he began to notice increasing erythema around his umbilical incision. He has monitored his temperature at home with a T max of 99.77F. No drainage from the incisions. His pain continues to improve, and he has stopped narcotic pain medications. He denied any nausea, emesis, trouble swallowing, or reflux symptoms. Interestingly, he does have a macular rash in the perfect outline of the surgical drapes and EKG stickers. This is itchy. Otherwise doing well.   Vital signs: BP 140/89   Pulse 76   Temp 98.2 F (36.8 C) (Oral)   Ht 5' 11.5" (1.816 m)   Wt 197 lb (89.4 kg)   SpO2 93%   BMI 27.09 kg/m    Physical Exam: Constitutional: Well appearing male, NAD Abdomen: Soft, non-tender, non-distended, no rebound/guarding Skin: His umbilical incision does have blanching erythema around it and extending inferiorly, there is no palpable fluctuance, no drainage. His remaining incisions are CDI with dermabond, no erythema. He does interestingly has a macular rash on his abdomen which is in the same pattern as the surgical draping and a perfect outline of his cardiac monitoring stickers.  Assessment/Plan: This is a 75 y.o. male 4 days s/p robotic assisted laparoscopicParaesophageal hernia repair with mesh andNissen fundoplication   - I do think he has a cellulitis of his umbilical incision. I will start him on 7 days of Augmentin BID. I reviewed signs/symptoms of worsening infection with him and his wife, and they seemed understanding of this. No evidence abscess at this time.   - He does appear to have an allergy to the adhesive from the surgical drape/EKG stickers. Encouraged benadryl cream or PO pills as needed for this.   - Pain control prn  with Tylenol / Motrin  - Stop PPI  - Reviewed wound care  - Reviewed lifting restrictions   - He will follow up as scheduled on 03/31; will be happy to see him sooner if needed  -- Edison Simon, PA-C [facility] 09/03/2020, 11:16 AM [phone] M-F: 7am - 4pm

## 2020-09-03 NOTE — Patient Instructions (Addendum)
Please pick up your prescription at the pharmacy and begin taking today. Please keep your scheduled appointment 09/13/20.  You can try Benadryl for the itching.

## 2020-09-07 ENCOUNTER — Other Ambulatory Visit: Payer: PPO

## 2020-09-13 ENCOUNTER — Encounter: Payer: Self-pay | Admitting: Physician Assistant

## 2020-09-13 ENCOUNTER — Ambulatory Visit (INDEPENDENT_AMBULATORY_CARE_PROVIDER_SITE_OTHER): Payer: PPO | Admitting: Physician Assistant

## 2020-09-13 ENCOUNTER — Other Ambulatory Visit: Payer: Self-pay

## 2020-09-13 VITALS — BP 131/75 | HR 60 | Temp 98.3°F | Ht 71.5 in | Wt 196.6 lb

## 2020-09-13 DIAGNOSIS — K449 Diaphragmatic hernia without obstruction or gangrene: Secondary | ICD-10-CM

## 2020-09-13 DIAGNOSIS — Z09 Encounter for follow-up examination after completed treatment for conditions other than malignant neoplasm: Secondary | ICD-10-CM

## 2020-09-13 NOTE — Patient Instructions (Addendum)
You can start a normal diet after one month of surgery. You may shower but stay away from bathing. You can alternate tylenol and motrin for pain. If you have any concerns or questions, please feel free to call our office.     GENERAL POST-OPERATIVE PATIENT INSTRUCTIONS   WOUND CARE INSTRUCTIONS:  Keep a dry clean dressing on the wound if there is drainage. The initial bandage may be removed after 24 hours.  Once the wound has quit draining you may leave it open to air.  If clothing rubs against the wound or causes irritation and the wound is not draining you may cover it with a dry dressing during the daytime.  Try to keep the wound dry and avoid ointments on the wound unless directed to do so.  If the wound becomes bright red and painful or starts to drain infected material that is not clear, please contact your physician immediately.  If the wound is mildly pink and has a thick firm ridge underneath it, this is normal, and is referred to as a healing ridge.  This will resolve over the next 4-6 weeks.  BATHING: You may shower if you have been informed of this by your surgeon. However, Please do not submerge in a tub, hot tub, or pool until incisions are completely sealed or have been told by your surgeon that you may do so.  DIET:  You may eat any foods that you can tolerate.  It is a good idea to eat a high fiber diet and take in plenty of fluids to prevent constipation.  If you do become constipated you may want to take a mild laxative or take ducolax tablets on a daily basis until your bowel habits are regular.  Constipation can be very uncomfortable, along with straining, after recent surgery.  ACTIVITY:  You are encouraged to cough and deep breath or use your incentive spirometer if you were given one, every 15-30 minutes when awake.  This will help prevent respiratory complications and low grade fevers post-operatively if you had a general anesthetic.  You may want to hug a pillow when coughing  and sneezing to add additional support to the surgical area, if you had abdominal or chest surgery, which will decrease pain during these times.  You are encouraged to walk and engage in light activity for the next two weeks.  You should not lift more than 10-20 pounds, until 09/27/2020 as it could put you at increased risk for complications.  Twenty pounds is roughly equivalent to a plastic bag of groceries. At that time- Listen to your body when lifting, if you have pain when lifting, stop and then try again in a few days. Soreness after doing exercises or activities of daily living is normal as you get back in to your normal routine.  MEDICATIONS:  Try to take narcotic medications and anti-inflammatory medications, such as tylenol, ibuprofen, naprosyn, etc., with food.  This will minimize stomach upset from the medication.  Should you develop nausea and vomiting from the pain medication, or develop a rash, please discontinue the medication and contact your physician.  You should not drive, make important decisions, or operate machinery when taking narcotic pain medication.  SUNBLOCK Use sun block to incision area over the next year if this area will be exposed to sun. This helps decrease scarring and will allow you avoid a permanent darkened area over your incision.

## 2020-09-13 NOTE — Progress Notes (Signed)
[facility] POST-OP OFFICE VISIT  09/13/2020  HPI: Alexis Sharp is a 75 y.o. male 13 days s/p robotic assisted laparoscopicParaesophageal hernia repair with mesh andNissen fundoplication with Dr Dahlia Byes.   He is doing well No abdominal pain, nausea, emesis, fever, chills No reflux symptoms He has completed his course of ABx and reports his erythema has improved He is adhering to Nissen diet recommendations No other complaints.   Vital signs: BP 131/75   Pulse 60   Temp 98.3 F (36.8 C) (Oral)   Ht 5' 11.5" (1.816 m)   Wt 196 lb 9.6 oz (89.2 kg)   SpO2 97%   BMI 27.04 kg/m    Physical Exam: Constitutional: Well appearing male, NAD Abdomen: Soft, non-tender, non-distended, no rebound/guarding Skin: Previous erythema and rash have resolved, laparoscopic incisions are healing well, some ecchymosis, no drainage.   Assessment/Plan: This is a 75 y.o. male 13 days s/p robotic assisted laparoscopicParaesophageal hernia repair with mesh andNissen fundoplication   - Pain control prn with OTC modalities  - Reviewed lifting restrictions; 6 weeks total  - Reviewed wound care; no need for further ABx  - Follow Nissen diet recommendations for 4 weeks total  - He will rtc on as needed basis at this point   -- Edison Simon, PA-C [facility] 09/13/2020, 11:23 AM [phone] M-F: 7am - 4pm

## 2020-09-17 ENCOUNTER — Ambulatory Visit: Payer: Self-pay | Admitting: Surgery

## 2020-09-21 DIAGNOSIS — H401133 Primary open-angle glaucoma, bilateral, severe stage: Secondary | ICD-10-CM | POA: Diagnosis not present

## 2020-10-02 DIAGNOSIS — H401133 Primary open-angle glaucoma, bilateral, severe stage: Secondary | ICD-10-CM | POA: Diagnosis not present

## 2020-10-07 ENCOUNTER — Encounter: Payer: Self-pay | Admitting: Internal Medicine

## 2020-10-08 NOTE — Telephone Encounter (Signed)
Patient scheduled for 10/16/20 at 10:30 am. Patient aware and agreeable.

## 2020-10-16 ENCOUNTER — Other Ambulatory Visit: Payer: PPO

## 2020-10-18 ENCOUNTER — Other Ambulatory Visit: Payer: Self-pay | Admitting: Internal Medicine

## 2020-10-18 DIAGNOSIS — E119 Type 2 diabetes mellitus without complications: Secondary | ICD-10-CM

## 2020-10-18 MED ORDER — METFORMIN HCL 500 MG PO TABS: 500.0000 mg | ORAL_TABLET | ORAL | 3 refills | Status: DC

## 2020-10-22 ENCOUNTER — Other Ambulatory Visit (INDEPENDENT_AMBULATORY_CARE_PROVIDER_SITE_OTHER): Payer: PPO

## 2020-10-22 ENCOUNTER — Other Ambulatory Visit: Payer: Self-pay

## 2020-10-22 DIAGNOSIS — Z794 Long term (current) use of insulin: Secondary | ICD-10-CM

## 2020-10-22 DIAGNOSIS — I152 Hypertension secondary to endocrine disorders: Secondary | ICD-10-CM

## 2020-10-22 DIAGNOSIS — E119 Type 2 diabetes mellitus without complications: Secondary | ICD-10-CM

## 2020-10-22 DIAGNOSIS — E1159 Type 2 diabetes mellitus with other circulatory complications: Secondary | ICD-10-CM

## 2020-10-22 LAB — COMPREHENSIVE METABOLIC PANEL
ALT: 12 U/L (ref 0–53)
AST: 13 U/L (ref 0–37)
Albumin: 4.3 g/dL (ref 3.5–5.2)
Alkaline Phosphatase: 51 U/L (ref 39–117)
BUN: 14 mg/dL (ref 6–23)
CO2: 26 mEq/L (ref 19–32)
Calcium: 9.3 mg/dL (ref 8.4–10.5)
Chloride: 106 mEq/L (ref 96–112)
Creatinine, Ser: 0.92 mg/dL (ref 0.40–1.50)
GFR: 82.79 mL/min (ref >60.00)
Glucose, Bld: 139 mg/dL — ABNORMAL HIGH (ref 70–99)
Potassium: 4.4 mEq/L (ref 3.5–5.1)
Sodium: 141 mEq/L (ref 135–145)
Total Bilirubin: 0.6 mg/dL (ref 0.2–1.2)
Total Protein: 6.6 g/dL (ref 6.0–8.3)

## 2020-10-22 LAB — HEMOGLOBIN A1C: Hgb A1c MFr Bld: 6.6 % — ABNORMAL HIGH (ref 4.6–6.5)

## 2020-10-22 LAB — CBC WITH DIFFERENTIAL/PLATELET
Basophils Absolute: 0 10*3/uL (ref 0.0–0.1)
Basophils Relative: 0.8 % (ref 0.0–3.0)
Eosinophils Absolute: 0.1 10*3/uL (ref 0.0–0.7)
Eosinophils Relative: 1.5 % (ref 0.0–5.0)
HCT: 45.5 % (ref 39.0–52.0)
Hemoglobin: 15.4 g/dL (ref 13.0–17.0)
Lymphocytes Relative: 27.6 % (ref 12.0–46.0)
Lymphs Abs: 1.3 10*3/uL (ref 0.7–4.0)
MCHC: 33.8 g/dL (ref 30.0–36.0)
MCV: 98.5 fl (ref 78.0–100.0)
Monocytes Absolute: 0.6 10*3/uL (ref 0.1–1.0)
Monocytes Relative: 12.5 % — ABNORMAL HIGH (ref 3.0–12.0)
Neutro Abs: 2.6 10*3/uL (ref 1.4–7.7)
Neutrophils Relative %: 57.6 % (ref 43.0–77.0)
Platelets: 212 10*3/uL (ref 150.0–400.0)
RBC: 4.62 Mil/uL (ref 4.22–5.81)
RDW: 13.8 % (ref 11.5–15.5)
WBC: 4.6 10*3/uL (ref 4.0–10.5)

## 2020-10-22 LAB — LIPID PANEL
Cholesterol: 175 mg/dL (ref 0–200)
HDL: 40.5 mg/dL (ref >39.00)
LDL Cholesterol: 119 mg/dL — ABNORMAL HIGH (ref 0–99)
NonHDL: 134.3
Total CHOL/HDL Ratio: 4
Triglycerides: 79 mg/dL (ref 0.0–149.0)
VLDL: 15.8 mg/dL (ref 0.0–40.0)

## 2020-10-23 ENCOUNTER — Other Ambulatory Visit: Payer: PPO

## 2020-10-24 ENCOUNTER — Other Ambulatory Visit: Payer: Self-pay

## 2020-10-24 ENCOUNTER — Encounter: Payer: Self-pay | Admitting: Internal Medicine

## 2020-10-24 ENCOUNTER — Ambulatory Visit (INDEPENDENT_AMBULATORY_CARE_PROVIDER_SITE_OTHER): Payer: PPO | Admitting: Internal Medicine

## 2020-10-24 VITALS — BP 108/74 | HR 58 | Temp 97.8°F | Ht 71.5 in | Wt 196.0 lb

## 2020-10-24 DIAGNOSIS — Z794 Long term (current) use of insulin: Secondary | ICD-10-CM | POA: Diagnosis not present

## 2020-10-24 DIAGNOSIS — Z125 Encounter for screening for malignant neoplasm of prostate: Secondary | ICD-10-CM

## 2020-10-24 DIAGNOSIS — E119 Type 2 diabetes mellitus without complications: Secondary | ICD-10-CM

## 2020-10-24 DIAGNOSIS — T148XXA Other injury of unspecified body region, initial encounter: Secondary | ICD-10-CM | POA: Diagnosis not present

## 2020-10-24 DIAGNOSIS — Z1329 Encounter for screening for other suspected endocrine disorder: Secondary | ICD-10-CM | POA: Diagnosis not present

## 2020-10-24 DIAGNOSIS — R0982 Postnasal drip: Secondary | ICD-10-CM | POA: Diagnosis not present

## 2020-10-24 DIAGNOSIS — J309 Allergic rhinitis, unspecified: Secondary | ICD-10-CM

## 2020-10-24 DIAGNOSIS — E785 Hyperlipidemia, unspecified: Secondary | ICD-10-CM | POA: Diagnosis not present

## 2020-10-24 DIAGNOSIS — Z0000 Encounter for general adult medical examination without abnormal findings: Secondary | ICD-10-CM | POA: Diagnosis not present

## 2020-10-24 MED ORDER — MUPIROCIN 2 % EX OINT: 1.0000 "application " | TOPICAL_OINTMENT | Freq: Two times a day (BID) | CUTANEOUS | 0 refills | Status: DC

## 2020-10-24 MED ORDER — LANTUS SOLOSTAR 100 UNIT/ML ~~LOC~~ SOPN: 22.0000 [IU] | PEN_INJECTOR | Freq: Every day | SUBCUTANEOUS | Status: DC

## 2020-10-24 MED ORDER — PRAVASTATIN SODIUM 10 MG PO TABS: 10.0000 mg | ORAL_TABLET | Freq: Every day | ORAL | 3 refills | Status: DC

## 2020-10-24 MED ORDER — AZELASTINE HCL 0.1 % NA SOLN: 2.0000 | Freq: Two times a day (BID) | NASAL | 12 refills | Status: DC | PRN

## 2020-10-24 NOTE — Progress Notes (Signed)
Chief Complaint  Patient presents with  . Follow-up   Annual  1. Dm 2 controlled 10/2020 A1C 6.6 improved w/o lows he reports inconsistent readings with libre 258 and fingerstick 130 on jardiance 25 mg qd and lanus 22-30 units avg 28 units pending what he eats agreeable to try statin 3x per week, on metformin 500 mg in am and 1000 mg qhs  2. Chronic sinus issues wants astelin nose spray which helps s/p sinus surgery and f/u ent Dr. Redmond Baseman and Dr. Richardson Landry  3. S/p paraesophageal hiatal hernia repair doing well but incisions to abdomen still painful   Review of Systems  Constitutional: Negative for weight loss.  HENT: Negative for hearing loss.        +PND  Eyes: Negative for blurred vision.  Respiratory: Negative for shortness of breath.   Cardiovascular: Negative for chest pain.  Gastrointestinal: Negative for abdominal pain and heartburn.  Musculoskeletal: Negative for falls and joint pain.  Skin: Negative for rash.  Neurological: Negative for dizziness.  Psychiatric/Behavioral: Negative for depression.   Past Medical History:  Diagnosis Date  . Allergy    dust, grass   . Diabetes mellitus   . Glaucoma    [facility] q 6 months   . Glaucoma   . Hyperlipidemia   . Migraines    rare   . Reflux esophagitis   . 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 Excised: 02/25/2016  . Wears hearing aid in both ears    Past Surgical History:  Procedure Laterality Date  . CATARACT EXTRACTION, BILATERAL    . COLONOSCOPY WITH PROPOFOL N/A 09/11/2017   Procedure: COLONOSCOPY WITH PROPOFOL;  Surgeon: Manya Silvas, MD;  Location: [facility] ENDOSCOPY;  Service: Endoscopy;  Laterality: N/A;  . ESOPHAGOGASTRODUODENOSCOPY (EGD) WITH PROPOFOL N/A 02/02/2018   Procedure: ESOPHAGOGASTRODUODENOSCOPY (EGD) WITH PROPOFOL;  Surgeon: [name], MD;  Location: [facility] ENDOSCOPY;  Service: Gastroenterology;  Laterality: N/A;  . EYE SURGERY      glaucoma left , glaucoma right x 2 Dr. Dorna Bloom   . HERNIA REPAIR    . IMAGE GUIDED SINUS SURGERY Bilateral 02/23/2018   Procedure: IMAGE GUIDED SINUS SURGERY Right ethmoidectomy with frontal exploration bilteral maxillary antrostomies Right sphenoidectomy;  Surgeon: Clyde Canterbury, MD;  Location: [place];  Service: ENT;  Laterality: Bilateral;  NEED STRYKER DISK GAVE DISK TO [facility] 9-9  . UMBILICAL HERNIA REPAIR  2002  . XI ROBOTIC ASSISTED PARAESOPHAGEAL HERNIA REPAIR N/A 08/30/2020   Procedure: XI ROBOTIC ASSISTED PARAESOPHAGEAL HERNIA REPAIR with Adrianne Allred, RNFA to assist;  Surgeon: Jules Husbands, MD;  Location: [facility] ORS;  Service: General;  Laterality: N/A;   Family History  Problem Relation Age of Onset  . Diabetes Mother   . Diabetes Sister   . Heart disease Brother   . Diabetes Brother   . Heart disease Brother   . Diabetes Brother   . Diabetes Brother   . Diabetes Sister   . Diabetes Sister    Social History   Socioeconomic History  . Marital status: Married    Spouse name: Not on file  . Number of children: 2  . Years of education: Not on file  . Highest education level: Not on file  Occupational History  . Occupation: semi retired Museum/gallery curator asst,  does Engineer, manufacturing: SELF EMPLOYED  Tobacco Use  . Smoking status: Former Research scientist (life sciences)  . Smokeless tobacco: Never Used  . Tobacco comment:  smoked some as teenager  Vaping Use  . Vaping Use: Never used  Substance and Sexual Activity  . Alcohol use: No    Alcohol/week: 0.0 standard drinks  . Drug use: No  . Sexual activity: Yes  Other Topics Concern  . Not on file  Social History Narrative   No living will   No health care POA---okay with wife making decisions. Daughter Joelene Millin would be alternate   Would accept resuscitation   Not sure about tube feeds   Married    Social Determinants of Health   Financial Resource Strain: Not on file  Food Insecurity: Not on file  Transportation  Needs: Not on file  Physical Activity: Not on file  Stress: Not on file  Social Connections: Not on file  Intimate Partner Violence: Not on file   Current Meds  Medication Sig  . azelastine (ASTELIN) 0.1 % nasal spray Place 2 sprays into both nostrils 2 (two) times daily as needed for rhinitis. Use in each nostril as directed  . bimatoprost (LUMIGAN) 0.01 % SOLN Place 1 drop into both eyes at bedtime.   . Continuous Blood Gluc Receiver (FREESTYLE LIBRE 2 READER) DEVI 1 Device by Does not apply route every 14 (fourteen) days.  . Continuous Blood Gluc Sensor (FREESTYLE LIBRE 2 SENSOR) MISC 1 Device by Does not apply route every 14 (fourteen) days.  . empagliflozin (JARDIANCE) 25 MG TABS tablet Take 1 tablet (25 mg total) by mouth daily.  Marland Kitchen glucose blood (ONE TOUCH ULTRA TEST) test strip USE ONE STRIP TO CHECK GLUCOSE bid  . Insulin Pen Needle (PEN NEEDLES) 31G X 6 MM MISC 1 Device by Does not apply route 3 (three) times daily as needed.  . Insulin Pen Needle (PEN NEEDLES) 32G X 4 MM MISC 1 Units by Does not apply route daily. Use 1 pen needle daily with Lantus  . metFORMIN (GLUCOPHAGE) 500 MG tablet Take 1-2 tablets (500-1,000 mg total) by mouth See admin instructions. Take 500 mg by mouth in am with food and 1000 mg in the evening with food  . Multiple Vitamin (MULTIVITAMIN) capsule Take 1 capsule by mouth daily.  . mupirocin ointment (BACTROBAN) 2 % Apply 1 application topically 2 (two) times daily.  Glory Rosebush DELICA LANCETS FINE MISC Use to check blood sugar once a day. Dx Code E11.9  . pravastatin (PRAVACHOL) 10 MG tablet Take 1 tablet (10 mg total) by mouth daily. Pt will start taking 3x per week  . timolol (TIMOPTIC) 0.5 % ophthalmic solution Place 1 drop into both eyes daily.  . [DISCONTINUED] insulin glargine (LANTUS SOLOSTAR) 100 UNIT/ML Solostar Pen Inject 22 Units into the skin daily. At night. If having fasting blood glucose readings less than 70 mg dl then reduce dose.APPT FURTHER  REFILLS (Patient taking differently: Inject 22-24 Units into the skin at bedtime.)   Allergies  Allergen Reactions  . Codeine   . Pioglitazone     REACTION: achy  . Sitagliptin Phosphate     REACTION: muscle aches  . Atorvastatin Other (See Comments)    myalgia  . Crestor [Rosuvastatin]     Muscle aching  . Tape Rash   Recent Results (from the past 2160 hour(s))  Cytology - Non PAP;     Status: None   Collection Time: 07/31/20 12:00 AM  Result Value Ref Range   CYTOLOGY - NON GYN      Cytology - Non PAP CASE: [id number] PATIENT: Shakeel Bertha Non-Gynecological Cytology Report  Specimen Submitted: A. Esophagus, distal; brushing  Clinical History: Dysphagia, hiatal hernia, GERD      DIAGNOSIS: A. ESOPHAGUS, DISTAL; BRUSHING: - NEGATIVE; NO EVIDENCE OF MALIGNANCY. - BENIGN SQUAMOUS CELLS. - NO EVIDENCE OF FUNGAL ELEMENTS.  Comment: GMS stain is negative for fungal elements.  Controls stain appropriately.   GROSS DESCRIPTION: A. Labeled: Distal esophagus Received: In a minute amount of unidentifiable liquid Collection time: Not provided Placed into formalin time: Not provided Volume: Less than 5 mL Description of fluid and container in which it is received: Light tan translucent fluid with a single metallic collection brush, received in a clear plastic container with a blue screw top lip Cytospin slide(s) received: No  Specimen material submitted for: Cell block and ThinPrep  The cell block material is fixed in formalin for  6 hours prior to processing.  Final Diagnosis performed by Betsy Pries, MD.   Electronically signed 08/03/2020 2:46:23PM The electronic signature indicates that the named Attending Pathologist has evaluated the specimen Technical component performed at [facility], [address], [place], [place] [address] Lab: [id number] Dir: Rush Farmer, MD, MMM  Professional component performed at [facility], [facility], [place], [place], [place] [address] Lab: [phone] Dir: Dellia Nims. Rubinas, MD   CBC     Status: None   Collection Time: 08/28/20  8:05 AM  Result Value Ref Range   WBC 5.4 4.0 - 10.5 K/uL   RBC 4.62 4.22 - 5.81 MIL/uL   Hemoglobin 15.1 13.0 - 17.0 g/dL   HCT 44.4 39.0 - 52.0 %   MCV 96.1 80.0 - 100.0 fL   MCH 32.7 26.0 - 34.0 pg   MCHC 34.0 30.0 - 36.0 g/dL   RDW 12.5 11.5 - 15.5 %   Platelets 209 150 - 400 K/uL   nRBC 0.0 0.0 - 0.2 %    Comment: Performed at [facility], [address] [place], [place] [address]  Basic metabolic panel     Status: Abnormal   Collection Time: 08/28/20  8:05 AM  Result Value Ref Range   Sodium 138 135 - 145 mmol/L   Potassium 4.0 3.5 - 5.1 mmol/L   Chloride 103 98 - 111 mmol/L   CO2 25 22 - 32 mmol/L   Glucose, Bld 137 (H) 70 - 99 mg/dL    Comment: Glucose reference range applies only to samples taken after fasting for at least 8 hours.   BUN 17 8 - 23 mg/dL   Creatinine, Ser 0.93 0.61 - 1.24 mg/dL   Calcium 9.0 8.9 - 10.3 mg/dL   GFR, Estimated >60 >60 mL/min    Comment: (NOTE) Calculated using the CKD-EPI Creatinine Equation (2021)    Anion gap 10 5 - 15    Comment: Performed at [facility], [place], [place] [address]  SARS CORONAVIRUS 2 (TAT 6-24 HRS) Nasopharyngeal Nasopharyngeal Swab     Status: None   Collection Time: 08/28/20  8:23 AM   Specimen: Nasopharyngeal Swab  Result Value Ref Range   SARS Coronavirus 2 NEGATIVE NEGATIVE    Comment: (NOTE) SARS-CoV-2 target nucleic acids are NOT DETECTED.  The SARS-CoV-2 RNA is generally detectable in upper and lower respiratory specimens during the acute phase of infection. Negative results do not preclude SARS-CoV-2 infection, do not rule out co-infections with other pathogens, and should not be used as the sole basis for treatment or other patient management decisions. Negative results must be combined with clinical  observations, patient history, and  epidemiological information. The expected result is Negative.  Fact Sheet for Patients: SugarRoll.be  Fact Sheet for Healthcare Providers: https://www.woods-mathews.com/  This test is not yet approved or cleared by the [place] FDA and  has been authorized for detection and/or diagnosis of SARS-CoV-2 by FDA under an Emergency Use Authorization (EUA). This EUA will remain  in effect (meaning this test can be used) for the duration of the COVID-19 declaration under Se ction 564(b)(1) of the Act, 21 U.S.C. section 360bbb-3(b)(1), unless the authorization is terminated or revoked sooner.  Performed at [facility], [place] [address] [place], [place] [address]   Glucose, capillary     Status: Abnormal   Collection Time: 08/30/20 10:05 AM  Result Value Ref Range   Glucose-Capillary 170 (H) 70 - 99 mg/dL    Comment: Glucose reference range applies only to samples taken after fasting for at least 8 hours.  Glucose, capillary     Status: Abnormal   Collection Time: 08/30/20  3:28 PM  Result Value Ref Range   Glucose-Capillary 181 (H) 70 - 99 mg/dL    Comment: Glucose reference range applies only to samples taken after fasting for at least 8 hours.  Glucose, capillary     Status: Abnormal   Collection Time: 08/30/20  4:06 PM  Result Value Ref Range   Glucose-Capillary 189 (H) 70 - 99 mg/dL    Comment: Glucose reference range applies only to samples taken after fasting for at least 8 hours.  Hemoglobin A1c     Status: Abnormal   Collection Time: 08/30/20  5:50 PM  Result Value Ref Range   Hgb A1c MFr Bld 6.8 (H) 4.8 - 5.6 %    Comment: (NOTE) Pre diabetes:          5.7%-6.4%  Diabetes:              >6.4%  Glycemic control for   <7.0% adults with diabetes    Mean Plasma Glucose 148.46 mg/dL    Comment: Performed at [facility] [address], [place], [place] [address]  CBC     Status:  Abnormal   Collection Time: 08/30/20  5:50 PM  Result Value Ref Range   WBC 11.3 (H) 4.0 - 10.5 K/uL   RBC 4.74 4.22 - 5.81 MIL/uL   Hemoglobin 15.7 13.0 - 17.0 g/dL   HCT 46.7 39.0 - 52.0 %   MCV 98.5 80.0 - 100.0 fL   MCH 33.1 26.0 - 34.0 pg   MCHC 33.6 30.0 - 36.0 g/dL   RDW 12.6 11.5 - 15.5 %   Platelets 168 150 - 400 K/uL   nRBC 0.0 0.0 - 0.2 %    Comment: Performed at [facility], [address] [place], [place] [address]  Creatinine, serum     Status: None   Collection Time: 08/30/20  5:50 PM  Result Value Ref Range   Creatinine, Ser 0.96 0.61 - 1.24 mg/dL   GFR, Estimated >60 >60 mL/min    Comment: (NOTE) Calculated using the CKD-EPI Creatinine Equation (2021) Performed at [facility], [address] [place], [place] [address]   Glucose, capillary     Status: Abnormal   Collection Time: 08/30/20  8:55 PM  Result Value Ref Range   Glucose-Capillary 250 (H) 70 - 99 mg/dL    Comment: Glucose reference range applies only to samples taken after fasting for at least 8 hours.  Basic metabolic panel     Status: Abnormal  Collection Time: 08/31/20  4:32 AM  Result Value Ref Range   Sodium 136 135 - 145 mmol/L   Potassium 4.1 3.5 - 5.1 mmol/L   Chloride 105 98 - 111 mmol/L   CO2 23 22 - 32 mmol/L   Glucose, Bld 191 (H) 70 - 99 mg/dL    Comment: Glucose reference range applies only to samples taken after fasting for at least 8 hours.   BUN 16 8 - 23 mg/dL   Creatinine, Ser 0.91 0.61 - 1.24 mg/dL   Calcium 8.4 (L) 8.9 - 10.3 mg/dL   GFR, Estimated >60 >60 mL/min    Comment: (NOTE) Calculated using the CKD-EPI Creatinine Equation (2021)    Anion gap 8 5 - 15    Comment: Performed at [facility], [address] [place],  [address]  Glucose, capillary     Status: Abnormal   Collection Time: 08/31/20  7:47 AM  Result Value Ref Range   Glucose-Capillary 190 (H) 70 - 99 mg/dL    Comment: Glucose reference range applies only to  samples taken after fasting for at least 8 hours.  Hemoglobin A1c     Status: Abnormal   Collection Time: 10/22/20  7:33 AM  Result Value Ref Range   Hgb A1c MFr Bld 6.6 (H) 4.6 - 6.5 %    Comment: Glycemic Control Guidelines for People with Diabetes:Non Diabetic:  <6%Goal of Therapy: <7%Additional Action Suggested:  >8%   CBC with Differential/Platelet     Status: Abnormal   Collection Time: 10/22/20  7:33 AM  Result Value Ref Range   WBC 4.6 4.0 - 10.5 K/uL   RBC 4.62 4.22 - 5.81 Mil/uL   Hemoglobin 15.4 13.0 - 17.0 g/dL   HCT 45.5 39.0 - 52.0 %   MCV 98.5 78.0 - 100.0 fl   MCHC 33.8 30.0 - 36.0 g/dL   RDW 13.8 11.5 - 15.5 %   Platelets 212.0 150.0 - 400.0 K/uL   Neutrophils Relative % 57.6 43.0 - 77.0 %   Lymphocytes Relative 27.6 12.0 - 46.0 %   Monocytes Relative 12.5 (H) 3.0 - 12.0 %   Eosinophils Relative 1.5 0.0 - 5.0 %   Basophils Relative 0.8 0.0 - 3.0 %   Neutro Abs 2.6 1.4 - 7.7 K/uL   Lymphs Abs 1.3 0.7 - 4.0 K/uL   Monocytes Absolute 0.6 0.1 - 1.0 K/uL   Eosinophils Absolute 0.1 0.0 - 0.7 K/uL   Basophils Absolute 0.0 0.0 - 0.1 K/uL  Lipid panel     Status: Abnormal   Collection Time: 10/22/20  7:33 AM  Result Value Ref Range   Cholesterol 175 0 - 200 mg/dL    Comment: ATP III Classification       Desirable:  < 200 mg/dL               Borderline High:  200 - 239 mg/dL          High:  > = 240 mg/dL   Triglycerides 79.0 0.0 - 149.0 mg/dL    Comment: Normal:  <150 mg/dLBorderline High:  150 - 199 mg/dL   HDL 40.50 >39.00 mg/dL   VLDL 15.8 0.0 - 40.0 mg/dL   LDL Cholesterol 119 (H) 0 - 99 mg/dL   Total CHOL/HDL Ratio 4     Comment:                Men          Women1/2 Average Risk  3.4          3.3Average Risk          5.0          4.42X Average Risk          9.6          7.13X Average Risk          15.0          11.0                       NonHDL 134.30     Comment: NOTE:  Non-HDL goal should be 30 mg/dL higher than patient's LDL goal (i.e. LDL goal of < 70 mg/dL,  would have non-HDL goal of < 100 mg/dL)  Comprehensive metabolic panel     Status: Abnormal   Collection Time: 10/22/20  7:33 AM  Result Value Ref Range   Sodium 141 135 - 145 mEq/L   Potassium 4.4 3.5 - 5.1 mEq/L   Chloride 106 96 - 112 mEq/L   CO2 26 19 - 32 mEq/L   Glucose, Bld 139 (H) 70 - 99 mg/dL   BUN 14 6 - 23 mg/dL   Creatinine, Ser 0.92 0.40 - 1.50 mg/dL   Total Bilirubin 0.6 0.2 - 1.2 mg/dL   Alkaline Phosphatase 51 39 - 117 U/L   AST 13 0 - 37 U/L   ALT 12 0 - 53 U/L   Total Protein 6.6 6.0 - 8.3 g/dL   Albumin 4.3 3.5 - 5.2 g/dL   GFR 82.79 >60.00 mL/min    Comment: Calculated using the CKD-EPI Creatinine Equation (2021)   Calcium 9.3 8.4 - 10.5 mg/dL   Objective  Body mass index is 26.96 kg/m. Wt Readings from Last 3 Encounters:  10/24/20 196 lb (88.9 kg)  09/13/20 196 lb 9.6 oz (89.2 kg)  09/03/20 197 lb (89.4 kg)   Temp Readings from Last 3 Encounters:  10/24/20 97.8 F (36.6 C) (Oral)  09/13/20 98.3 F (36.8 C) (Oral)  09/03/20 98.2 F (36.8 C) (Oral)   BP Readings from Last 3 Encounters:  10/24/20 108/74  09/13/20 131/75  09/03/20 140/89   Pulse Readings from Last 3 Encounters:  10/24/20 (!) 58  09/13/20 60  09/03/20 76    Physical Exam Vitals and nursing note reviewed.  Constitutional:      Appearance: Normal appearance. He is well-developed, well-groomed and overweight.  HENT:     Head: Normocephalic and atraumatic.  Eyes:     Conjunctiva/sclera: Conjunctivae normal.     Pupils: Pupils are equal, round, and reactive to light.  Cardiovascular:     Rate and Rhythm: Normal rate and regular rhythm.     Heart sounds: Normal heart sounds. No murmur heard.   Pulmonary:     Effort: Pulmonary effort is normal.     Breath sounds: Normal breath sounds.  Abdominal:    Skin:    General: Skin is warm and dry.  Neurological:     General: No focal deficit present.     Mental Status: He is alert and oriented to person, place, and time. Mental  status is at baseline.     Gait: Gait normal.  Psychiatric:        Attention and Perception: Attention and perception normal.        Mood and Affect: Mood and affect normal.        Speech: Speech normal.        Behavior:  Behavior normal. Behavior is cooperative.        Thought Content: Thought content normal.        Cognition and Memory: Cognition and memory normal.        Judgment: Judgment normal.     Assessment  Plan  Annual physical exam Flu shotutd prevnarutd, pna 23 had 5/10/17utd Tdap utd,  shingrix per pt had2/2 at [place] 4/4   HCV neg 10/24/15  Colonoscopy had 09/11/17 for multiple polyps [facility] tubular f/u in 3 yearsEGD/Colonoscopy 07/31/20   Dermatology yearly Dr. Nicole Kindred h/o Aks6/29/21;due to see w/in 6 monthsnext week appt appt 02/12/21   PSA nl 0.45 03/09/20 do DRE in future  rec healthy diet and exercise   AEDr. Klifto2/24/21no retinopathy eye surface disease glaucoma surgery 2019 to reduce pressureleft eyeand pressure 14 as of 10/24/20 and had surgery right eye x 2 but pressure still 22 and appt 10/26/20 to f/u    Hyperlipidemia, unspecified hyperlipidemia type - Plan: pravastatin (PRAVACHOL) 10 MG tablet See below 3x per week may try daily Nasal saline  astelin nose spray  xyzal allergy (new), allegra, claritin, zyrtec try to take at night  Allergic rhinitis, PND - Plan: azelastine (ASTELIN) 0.1 % nasal spray F/u ent Dr. Marda Stalker   Open wound to abdomen wounds s/p hernia repair- Plan: mupirocin ointment (BACTROBAN) 2 %  Hypertension none today with diabetes ([place]) controlled w/o BP issues today no meds diabetes A1C 6.6 - Plan: insulin glargine (LANTUS SOLOSTAR) 100 UNIT/ML Solostar Pen 22-30 units with other meds jardiance 25 and metfromin 500 am and 1000 mg qhs  Cant tolerate statin but agreeable to try pravachol 10 mg 3x per week then possibly qd   Provider: Dr. Olivia Mackie McLean-Scocuzza-Internal Medicine

## 2020-10-24 NOTE — Patient Instructions (Addendum)
DEXCOM ask endocrine about this if you want to change machines  Nasal saline  astelin nose spray  xyzal allergy (new), allegra, claritin, zyrtec try to take at night Dove antibacterial soap bodywash to skin    Postnasal Drip Postnasal drip is the feeling of mucus going down the back of your throat. Mucus is a slimy substance that moistens and cleans your nose and throat, as well as the air pockets in face bones near your forehead and cheeks (sinuses). Small amounts of mucus pass from your nose and sinuses down the back of your throat all the time. This is normal. When you produce too much mucus or the mucus gets too thick, you can feel it. Some common causes of postnasal drip include:  Having more mucus because of: ? A cold or the flu. ? Allergies. ? Cold air. ? Certain medicines.  Having more mucus that is thicker because of: ? A sinus or nasal infection. ? Dry air. ? A food allergy. Follow these instructions at home: Relieving discomfort  Gargle with a salt-water mixture 3-4 times a day or as needed. To make a salt-water mixture, completely dissolve -1 tsp of salt in 1 cup of warm water.  If the air in your home is dry, use a humidifier to add moisture to the air.  Use a saline spray or container (neti pot) to flush out the nose (nasal irrigation). These methods can help clear away mucus and keep the nasal passages moist.   General instructions  Take over-the-counter and prescription medicines only as told by your health care provider.  Follow instructions from your health care provider about eating or drinking restrictions. You may need to avoid caffeine.  Avoid things that you know you are allergic to (allergens), like dust, mold, pollen, pets, or certain foods.  Drink enough fluid to keep your urine pale yellow.  Keep all follow-up visits as told by your health care provider. This is important. Contact a health care provider if:  You have a fever.  You have a sore  throat.  You have difficulty swallowing.  You have headache.  You have sinus pain.  You have a cough that does not go away.  The mucus from your nose becomes thick and is green or yellow in color.  You have cold or flu symptoms that last more than 10 days. Summary  Postnasal drip is the feeling of mucus going down the back of your throat.  If your health care provider approves, use nasal irrigation or a nasal spray 2?4 times a day.  Avoid things that you know you are allergic to (allergens), like dust, mold, pollen, pets, or certain foods. This information is not intended to replace advice given to you by your health care provider. Make sure you discuss any questions you have with your health care provider. Document Revised: 03/13/2020 Document Reviewed: 03/13/2020 Elsevier Patient Education  2021 [place].  Allergic Rhinitis, Adult  Allergic rhinitis is an allergic reaction that affects the mucous membrane inside the nose. The mucous membrane is the tissue that produces mucus. There are two types of allergic rhinitis:  Seasonal. This type is also called hay fever and happens only during certain seasons.  Perennial. This type can happen at any time of the year. Allergic rhinitis cannot be spread from person to person. This condition can be mild, moderate, or severe. It can develop at any age and may be outgrown. What are the causes? This condition is caused by allergens. These  are things that can cause an allergic reaction. Allergens may differ for seasonal allergic rhinitis and perennial allergic rhinitis.  Seasonal allergic rhinitis is triggered by pollen. Pollen can come from grasses, trees, and weeds.  Perennial allergic rhinitis may be triggered by: ? Dust mites. ? Proteins in a pet's urine, saliva, or dander. Dander is dead skin cells from a pet. ? Smoke, mold, or car fumes. What increases the risk? You are more likely to develop this condition if you have a  family history of allergies or other conditions related to allergies, including:  Allergic conjunctivitis. This is inflammation of parts of the eyes and eyelids.  Asthma. This condition affects the lungs and makes it hard to breathe.  Atopic dermatitis or eczema. This is long term (chronic) inflammation of the skin.  Food allergies. What are the signs or symptoms? Symptoms of this condition include:  Sneezing or coughing.  A stuffy nose (nasal congestion), itchy nose, or nasal discharge.  Itchy eyes and tearing of the eyes.  A feeling of mucus dripping down the back of your throat (postnasal drip).  Trouble sleeping.  Tiredness or fatigue.  Headache.  Sore throat. How is this diagnosed? This condition may be diagnosed with your symptoms, medical history, and physical exam. Your health care provider may check for related conditions, such as:  Asthma.  Pink eye. This is eye inflammation caused by infection (conjunctivitis).  Ear infection.  Upper respiratory infection. This is an infection in the nose, throat, or upper airways. You may also have tests to find out which allergens trigger your symptoms. These may include skin tests or blood tests. How is this treated? There is no cure for this condition, but treatment can help control symptoms. Treatment may include:  Taking medicines that block allergy symptoms, such as corticosteroids and antihistamines. Medicine may be given as a shot, nasal spray, or pill.  Avoiding any allergens.  Being exposed again and again to tiny amounts of allergens to help you build a defense against allergens (immunotherapy). This is done if other treatments have not helped. It may include: ? Allergy shots. These are injected medicines that have small amounts of allergen in them. ? Sublingual immunotherapy. This involves taking small doses of a medicine with allergen in it under your tongue. If these treatments do not work, your health care  provider may prescribe newer, stronger medicines. Follow these instructions at home: Avoiding allergens Find out what you are allergic to and avoid those allergens. These are some things you can do to help avoid allergens:  If you have perennial allergies: ? Replace carpet with wood, tile, or vinyl flooring. Carpet can trap dander and dust. ? Do not smoke. Do not allow smoking in your home. ? Change your heating and air conditioning filters at least once a month.  If you have seasonal allergies, take these steps during allergy season: ? Keep windows closed as much as possible. ? Plan outdoor activities when pollen counts are lowest. Check pollen counts before you plan outdoor activities. ? When coming indoors, change clothing and shower before sitting on furniture or bedding.  If you have a pet in the house that produces allergens: ? Keep the pet out of the bedroom. ? Vacuum, sweep, and dust regularly. General instructions  Take over-the-counter and prescription medicines only as told by your health care provider.  Drink enough fluid to keep your urine pale yellow.  Keep all follow-up visits as told by your health care provider. This is important.  Where to find more information  American Academy of Allergy, Asthma & Immunology: www.aaaai.org Contact a health care provider if:  You have a fever.  You develop a cough that does not go away.  You make whistling sounds when you breathe (wheeze).  Your symptoms slow you down or stop you from doing your normal activities each day. Get help right away if:  You have shortness of breath. This symptom may represent a serious problem that is an emergency. Do not wait to see if the symptom will go away. Get medical help right away. Call your local emergency services (911 in the [place] Do not drive yourself to the hospital. Summary  Allergic rhinitis may be managed by taking medicines as directed and avoiding allergens.  If you have  seasonal allergies, keep windows closed as much as possible during allergy season.  Contact your health care provider if you develop a fever or a cough that does not go away. This information is not intended to replace advice given to you by your health care provider. Make sure you discuss any questions you have with your health care provider. Document Revised: 07/22/2019 Document Reviewed: 05/31/2019 Elsevier Patient Education  2021 [employer].

## 2020-10-26 DIAGNOSIS — H401133 Primary open-angle glaucoma, bilateral, severe stage: Secondary | ICD-10-CM | POA: Diagnosis not present

## 2020-10-30 ENCOUNTER — Other Ambulatory Visit: Payer: Self-pay | Admitting: Internal Medicine

## 2020-10-30 DIAGNOSIS — E785 Hyperlipidemia, unspecified: Secondary | ICD-10-CM

## 2020-10-30 MED ORDER — PRAVASTATIN SODIUM 10 MG PO TABS
10.0000 mg | ORAL_TABLET | ORAL | 3 refills | Status: DC
Start: 2020-10-31 — End: 2020-11-05

## 2020-11-02 ENCOUNTER — Telehealth: Payer: Self-pay | Admitting: Internal Medicine

## 2020-11-02 DIAGNOSIS — E119 Type 2 diabetes mellitus without complications: Secondary | ICD-10-CM | POA: Diagnosis not present

## 2020-11-02 NOTE — Telephone Encounter (Signed)
I spoke to patient.  Patient had his eyes dilated and couldn't schedule appointment. I'll call patient back and schedule Medicare Annual Wellness Visit (AWV) in office or VV next week.  Last AWV:08/06/2018

## 2020-11-05 ENCOUNTER — Other Ambulatory Visit: Payer: Self-pay | Admitting: Internal Medicine

## 2020-11-05 DIAGNOSIS — E785 Hyperlipidemia, unspecified: Secondary | ICD-10-CM

## 2020-11-05 LAB — HM DIABETES EYE EXAM

## 2020-11-05 MED ORDER — PRAVASTATIN SODIUM 10 MG PO TABS: 10.0000 mg | ORAL_TABLET | ORAL | 3 refills | Status: DC

## 2020-11-06 ENCOUNTER — Encounter: Payer: Self-pay | Admitting: Internal Medicine

## 2020-12-12 ENCOUNTER — Telehealth: Payer: Self-pay

## 2020-12-12 ENCOUNTER — Ambulatory Visit (INDEPENDENT_AMBULATORY_CARE_PROVIDER_SITE_OTHER): Payer: PPO

## 2020-12-12 VITALS — Ht 71.5 in | Wt 190.0 lb

## 2020-12-12 DIAGNOSIS — Z794 Long term (current) use of insulin: Secondary | ICD-10-CM

## 2020-12-12 DIAGNOSIS — Z0000 Encounter for general adult medical examination without abnormal findings: Secondary | ICD-10-CM

## 2020-12-12 DIAGNOSIS — E119 Type 2 diabetes mellitus without complications: Secondary | ICD-10-CM | POA: Diagnosis not present

## 2020-12-12 NOTE — Chronic Care Management (AMB) (Signed)
  Chronic Care Management   Note  12/12/2020 Name: Alexis Sharp MRN: [id number] DOB: Dec 12, 1945  Alexis Sharp is a 75 y.o. year old male who is a primary care patient of McLean-Scocuzza, Nino Glow, MD. I reached out to Alexis Sharp by phone today in response to a referral sent by Alexis Sharp's patient's AWV (annual wellness visit) nurse, O'brien-Blaney, Denisa L, LPN.      Alexis Sharp was given information about Chronic Care Management services today including:  CCM service includes personalized support from designated clinical staff supervised by his physician, including individualized plan of care and coordination with other care providers 24/7 contact phone numbers for assistance for urgent and routine care needs. Service will only be billed when office clinical staff spend 20 minutes or more in a month to coordinate care. Only one practitioner may furnish and bill the service in a calendar month. The patient may stop CCM services at any time (effective at the end of the month) by phone call to the office staff. The patient will be responsible for cost sharing (co-pay) of up to 20% of the service fee (after annual deductible is met).  Patient agreed to services and verbal consent obtained.   Follow up plan: Telephone appointment with care management team member scheduled for:01/15/2021  Noreene Larsson, [place], [place], [place] [address] Direct Dial: [phone] [email or web address]

## 2020-12-12 NOTE — Patient Instructions (Addendum)
Alexis Sharp , Thank you for taking time to come for your Medicare Wellness Visit. I appreciate your ongoing commitment to your health goals. Please review the following plan we discussed and let me know if I can assist you in the future.   These are the goals we discussed:  Goals      Maintain healthy lifestyle     Stay active Healthy diet         This is a list of the screening recommended for you and due dates:  Health Maintenance  Topic Date Due   Flu Shot  01/14/2021   Complete foot exam   01/19/2021   COVID-19 Vaccine (5 - Booster for Pfizer series) 01/28/2021   Urine Protein Check  03/09/2021   Hemoglobin A1C  04/24/2021   Eye exam for diabetics  11/05/2021   Tetanus Vaccine  06/30/2023   Colon Cancer Screening  07/31/2030   Hepatitis C Screening: USPSTF Recommendation to screen - Ages 18-79 yo.  Completed   Pneumonia vaccines  Completed   Zoster (Shingles) Vaccine  Completed   HPV Vaccine  Aged Out    Advanced directives: not yet completed  Conditions/risks identified: none new  Follow up in one year for your annual wellness visit.   Preventive Care 38 Years and Older, Male Preventive care refers to lifestyle choices and visits with your health care provider that can promote health and wellness. What does preventive care include? A yearly physical exam. This is also called an annual well check. Dental exams once or twice a year. Routine eye exams. Ask your health care provider how often you should have your eyes checked. Personal lifestyle choices, including: Daily care of your teeth and gums. Regular physical activity. Eating a healthy diet. Avoiding tobacco and drug use. Limiting alcohol use. Practicing safe sex. Taking low doses of aspirin every day. Taking vitamin and mineral supplements as recommended by your health care provider. What happens during an annual well check? The services and screenings done by your health care provider during your annual  well check will depend on your age, overall health, lifestyle risk factors, and family history of disease. Counseling  Your health care provider may ask you questions about your: Alcohol use. Tobacco use. Drug use. Emotional well-being. Home and relationship well-being. Sexual activity. Eating habits. History of falls. Memory and ability to understand (cognition). Work and work Statistician. Screening  You may have the following tests or measurements: Height, weight, and BMI. Blood pressure. Lipid and cholesterol levels. These may be checked every 5 years, or more frequently if you are over 61 years old. Skin check. Lung cancer screening. You may have this screening every year starting at age 38 if you have a 30-pack-year history of smoking and currently smoke or have quit within the past 15 years. Fecal occult blood test (FOBT) of the stool. You may have this test every year starting at age 49. Flexible sigmoidoscopy or colonoscopy. You may have a sigmoidoscopy every 5 years or a colonoscopy every 10 years starting at age 44. Prostate cancer screening. Recommendations will vary depending on your family history and other risks. Hepatitis C blood test. Hepatitis B blood test. Sexually transmitted disease (STD) testing. Diabetes screening. This is done by checking your blood sugar (glucose) after you have not eaten for a while (fasting). You may have this done every 1-3 years. Abdominal aortic aneurysm (AAA) screening. You may need this if you are a current or former smoker. Osteoporosis. You may be screened  starting at age 78 if you are at high risk. Talk with your health care provider about your test results, treatment options, and if necessary, the need for more tests. Vaccines  Your health care provider may recommend certain vaccines, such as: Influenza vaccine. This is recommended every year. Tetanus, diphtheria, and acellular pertussis (Tdap, Td) vaccine. You may need a Td booster  every 10 years. Zoster vaccine. You may need this after age 69. Pneumococcal 13-valent conjugate (PCV13) vaccine. One dose is recommended after age 59. Pneumococcal polysaccharide (PPSV23) vaccine. One dose is recommended after age 56. Talk to your health care provider about which screenings and vaccines you need and how often you need them. This information is not intended to replace advice given to you by your health care provider. Make sure you discuss any questions you have with your health care provider. Document Released: 06/29/2015 Document Revised: 02/20/2016 Document Reviewed: 04/03/2015 Elsevier Interactive Patient Education  2017 [place] Prevention in the Home Falls can cause injuries. They can happen to people of all ages. There are many things you can do to make your home safe and to help prevent falls. What can I do on the outside of my home? Regularly fix the edges of walkways and driveways and fix any cracks. Remove anything that might make you trip as you walk through a door, such as a raised step or threshold. Trim any bushes or trees on the path to your home. Use bright outdoor lighting. Clear any walking paths of anything that might make someone trip, such as rocks or tools. Regularly check to see if handrails are loose or broken. Make sure that both sides of any steps have handrails. Any raised decks and porches should have guardrails on the edges. Have any leaves, snow, or ice cleared regularly. Use sand or salt on walking paths during winter. Clean up any spills in your garage right away. This includes oil or grease spills. What can I do in the bathroom? Use night lights. Install grab bars by the toilet and in the tub and shower. Do not use towel bars as grab bars. Use non-skid mats or decals in the tub or shower. If you need to sit down in the shower, use a plastic, non-slip stool. Keep the floor dry. Clean up any water that spills on the floor as soon  as it happens. Remove soap buildup in the tub or shower regularly. Attach bath mats securely with double-sided non-slip rug tape. Do not have throw rugs and other things on the floor that can make you trip. What can I do in the bedroom? Use night lights. Make sure that you have a light by your bed that is easy to reach. Do not use any sheets or blankets that are too big for your bed. They should not hang down onto the floor. Have a firm chair that has side arms. You can use this for support while you get dressed. Do not have throw rugs and other things on the floor that can make you trip. What can I do in the kitchen? Clean up any spills right away. Avoid walking on wet floors. Keep items that you use a lot in easy-to-reach places. If you need to reach something above you, use a strong step stool that has a grab bar. Keep electrical cords out of the way. Do not use floor polish or wax that makes floors slippery. If you must use wax, use non-skid floor wax. Do not have throw  rugs and other things on the floor that can make you trip. What can I do with my stairs? Do not leave any items on the stairs. Make sure that there are handrails on both sides of the stairs and use them. Fix handrails that are broken or loose. Make sure that handrails are as long as the stairways. Check any carpeting to make sure that it is firmly attached to the stairs. Fix any carpet that is loose or worn. Avoid having throw rugs at the top or bottom of the stairs. If you do have throw rugs, attach them to the floor with carpet tape. Make sure that you have a light switch at the top of the stairs and the bottom of the stairs. If you do not have them, ask someone to add them for you. What else can I do to help prevent falls? Wear shoes that: Do not have high heels. Have rubber bottoms. Are comfortable and fit you well. Are closed at the toe. Do not wear sandals. If you use a stepladder: Make sure that it is fully  opened. Do not climb a closed stepladder. Make sure that both sides of the stepladder are locked into place. Ask someone to hold it for you, if possible. Clearly mark and make sure that you can see: Any grab bars or handrails. First and last steps. Where the edge of each step is. Use tools that help you move around (mobility aids) if they are needed. These include: Canes. Walkers. Scooters. Crutches. Turn on the lights when you go into a dark area. Replace any light bulbs as soon as they burn out. Set up your furniture so you have a clear path. Avoid moving your furniture around. If any of your floors are uneven, fix them. If there are any pets around you, be aware of where they are. Review your medicines with your doctor. Some medicines can make you feel dizzy. This can increase your chance of falling. Ask your doctor what other things that you can do to help prevent falls. This information is not intended to replace advice given to you by your health care provider. Make sure you discuss any questions you have with your health care provider. Document Released: 03/29/2009 Document Revised: 11/08/2015 Document Reviewed: 07/07/2014 Elsevier Interactive Patient Education  2017 [employer].

## 2020-12-12 NOTE — Progress Notes (Signed)
Subjective:   Alexis Sharp is a 75 y.o. male who presents for Medicare Annual/Subsequent preventive examination.  Review of Systems    No ROS.  Medicare Wellness Virtual Visit.  Visual/audio telehealth visit, UTA vital signs.   See social history for additional risk factors.   Cardiac Risk Factors include: advanced age (>29men, >25 women);diabetes mellitus;male gender     Objective:    Today's Vitals   12/12/20 1034  Weight: 190 lb (86.2 kg)  Height: 5' 11.5" (1.816 m)   Body mass index is 26.13 kg/m.  Advanced Directives 12/12/2020 08/30/2020 08/23/2020 08/06/2018 02/23/2018 02/02/2018 01/21/2018  Does Patient Have a Medical Advance Directive? No No No No No No No  Would patient like information on creating a medical advance directive? No - Patient declined No - Patient declined No - Patient declined No - Patient declined No - Patient declined No - Patient declined No - Patient declined    Current Medications (verified) Outpatient Encounter Medications as of 12/12/2020  Medication Sig   azelastine (ASTELIN) 0.1 % nasal spray Place 2 sprays into both nostrils 2 (two) times daily as needed for rhinitis. Use in each nostril as directed   bimatoprost (LUMIGAN) 0.01 % SOLN Place 1 drop into both eyes at bedtime.    Continuous Blood Gluc Receiver (FREESTYLE LIBRE 2 READER) DEVI 1 Device by Does not apply route every 14 (fourteen) days.   Continuous Blood Gluc Sensor (FREESTYLE LIBRE 2 SENSOR) MISC 1 Device by Does not apply route every 14 (fourteen) days.   empagliflozin (JARDIANCE) 25 MG TABS tablet Take 1 tablet (25 mg total) by mouth daily.   glucose blood (ONE TOUCH ULTRA TEST) test strip USE ONE STRIP TO CHECK GLUCOSE bid   insulin glargine (LANTUS SOLOSTAR) 100 UNIT/ML Solostar Pen Inject 22-30 Units into the skin at bedtime. avg units 28   Insulin Pen Needle (PEN NEEDLES) 31G X 6 MM MISC 1 Device by Does not apply route 3 (three) times daily as needed.   Insulin Pen Needle (PEN  NEEDLES) 32G X 4 MM MISC 1 Units by Does not apply route daily. Use 1 pen needle daily with Lantus   metFORMIN (GLUCOPHAGE) 500 MG tablet Take 1-2 tablets (500-1,000 mg total) by mouth See admin instructions. Take 500 mg by mouth in am with food and 1000 mg in the evening with food   Multiple Vitamin (MULTIVITAMIN) capsule Take 1 capsule by mouth daily.   mupirocin ointment (BACTROBAN) 2 % Apply 1 application topically 2 (two) times daily.   ONETOUCH DELICA LANCETS FINE MISC Use to check blood sugar once a day. Dx Code E11.9   pravastatin (PRAVACHOL) 10 MG tablet Take 1 tablet (10 mg total) by mouth 3 (three) times a week.   timolol (TIMOPTIC) 0.5 % ophthalmic solution Place 1 drop into both eyes daily.   No facility-administered encounter medications on file as of 12/12/2020.    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 Excised: 02/25/2016   Wears hearing aid in both ears    Past Surgical History:  Procedure Laterality Date   CATARACT EXTRACTION, BILATERAL     COLONOSCOPY WITH PROPOFOL N/A 09/11/2017   Procedure: COLONOSCOPY WITH PROPOFOL;  Surgeon: Manya Silvas, MD;  Location: [facility] ENDOSCOPY;  Service: Endoscopy;  Laterality: N/A;   ESOPHAGOGASTRODUODENOSCOPY (EGD) WITH PROPOFOL N/A 02/02/2018   Procedure: ESOPHAGOGASTRODUODENOSCOPY (EGD) WITH PROPOFOL;  Surgeon: [name], MD;  Location: [facility] ENDOSCOPY;  Service: Gastroenterology;  Laterality: N/A;   EYE SURGERY     glaucoma left , glaucoma right x 2 Dr. Dorna Bloom    HERNIA REPAIR     IMAGE GUIDED SINUS SURGERY Bilateral 02/23/2018    Procedure: IMAGE GUIDED SINUS SURGERY Right ethmoidectomy with frontal exploration bilteral maxillary antrostomies Right sphenoidectomy;  Surgeon: Clyde Canterbury, MD;  Location: [place];  Service: ENT;  Laterality: Bilateral;  NEED STRYKER DISK GAVE DISK TO [facility] 9-9   UMBILICAL HERNIA REPAIR  2002   XI ROBOTIC ASSISTED PARAESOPHAGEAL HERNIA REPAIR N/A 08/30/2020   Procedure: XI ROBOTIC ASSISTED PARAESOPHAGEAL HERNIA REPAIR with Armida Sans, RNFA to assist;  Surgeon: Jules Husbands, MD;  Location: [facility] ORS;  Service: General;  Laterality: N/A;   Family History  Problem Relation Age of Onset   Diabetes Mother    Diabetes Sister    Heart disease Brother    Diabetes Brother    Heart disease Brother    Diabetes Brother    Diabetes Brother    Diabetes Sister    Diabetes Sister    Social History   Socioeconomic History   Marital status: Married    Spouse name: Not on file   Number of children: 2   Years of education: Not on file   Highest education level: Not on file  Occupational History   Occupation: semi retired Museum/gallery curator asst,  does Engineer, manufacturing: SELF EMPLOYED  Tobacco Use   Smoking status: Former    Pack years: 0.00   Smokeless tobacco: Never   Tobacco comments:    smoked some as teenager  Media planner   Vaping Use: Never used  Substance and Sexual Activity   Alcohol use: No    Alcohol/week: 0.0 standard drinks   Drug use: No   Sexual activity: Yes  Other Topics Concern   Not on file  Social History Narrative   No living will   No health care POA---okay with wife making decisions. Daughter Joelene Millin would be alternate   Would accept resuscitation   Not sure about tube feeds   Married    Social Determinants of Health   Financial Resource Strain: Low Risk    Difficulty of Paying Living Expenses: Not hard at all  Food Insecurity: No Food Insecurity   Worried About Charity fundraiser in the Last Year: Never true   Arboriculturist in  the Last Year: Never true  Transportation Needs: No Transportation Needs   Lack of Transportation (Medical): No   Lack of Transportation (Non-Medical): No  Physical Activity: Sufficiently Active   Days of Exercise per Week: 4 days   Minutes of Exercise per Session: 60 min  Stress: No Stress Concern Present   Feeling of Stress : Not at all  Social Connections: Unknown   Frequency of Communication with Friends and Family: Not on file   Frequency of Social Gatherings with Friends and Family: Not on file   Attends Religious Services: Not on file   Active Member of Clubs or Organizations: Not on file   Attends Archivist Meetings: Not  on file   Marital Status: Married    Tobacco Counseling Counseling given: Not Answered Tobacco comments: smoked some as teenager   Clinical Intake:  Pre-visit preparation completed: Yes        Diabetes: Yes (Followed by PCP)  How often do you need to have someone help you when you read instructions, pamphlets, or other written materials from your doctor or pharmacy?: 1 - Never    Interpreter Needed?: No      Activities of Daily Living In your present state of health, do you have any difficulty performing the following activities: 12/12/2020 08/30/2020  Hearing? Y Y  Comment - -  Vision? N N  Difficulty concentrating or making decisions? N N  Walking or climbing stairs? N N  Dressing or bathing? N N  Doing errands, shopping? N N  Preparing Food and eating ? N -  Using the Toilet? N -  In the past six months, have you accidently leaked urine? N -  Do you have problems with loss of bowel control? N -  Managing your Medications? N -  Managing your Finances? N -  Housekeeping or managing your Housekeeping? N -  Some recent data might be hidden    Patient Care Team: McLean-Scocuzza, Nino Glow, MD as PCP - General (Internal Medicine) Marylynn Pearson, MD as Consulting Physician (Ophthalmology)  Indicate any recent Medical Services  you may have received from other than [facility] providers in the past year (date may be approximate).     Assessment:   This is a routine wellness examination for [place].  I connected with Marcques today by telephone and verified that I am speaking with the correct person using two identifiers. Location patient: home Location provider: work Persons participating in the virtual visit: patient, Marine scientist.    I discussed the limitations, risks, security and privacy concerns of performing an evaluation and management service by telephone and the availability of in person appointments. The patient expressed understanding and verbally consented to this telephonic visit.    Interactive audio and video telecommunications were attempted between this provider and patient, however failed, due to patient having technical difficulties OR patient did not have access to video capability.  We continued and completed visit with audio only.  Some vital signs may be absent or patient reported.   Hearing/Vision screen Hearing Screening - Comments:: Hearing aids Vision Screening - Comments:: Follow[facility], Dr. Dorna Bloom Wears corrective lenses  No retinopathy  Cataract extraction, bilateral   Dietary issues and exercise activities discussed: Current Exercise Habits: Home exercise routine, Type of exercise: strength training/weights;walking, Intensity: Mild Healthy diet  Good water intake   Goals Addressed               This Visit's Progress     Patient Stated     COMPLETED: Weight (lb) < 200 lb (90.7 kg) (pt-stated)   190 lb (86.2 kg)     Monitor carb intake  Stay hydrated Increase physical activity       Other     Maintain healthy lifestyle        Stay active Healthy diet        Depression Screen PHQ 2/9 Scores 12/12/2020 01/20/2020 08/06/2018 12/23/2017 02/03/2017 10/24/2015 07/20/2014  PHQ - 2 Score 0 0 0 0 0 0 0  PHQ- 9 Score 0 - - - - - -    Fall Risk Fall Risk  12/12/2020  10/24/2020 09/03/2020 06/27/2020 04/25/2020  Falls in the past year? 0  0 0 0 0  Comment - - - - -  Number falls in past yr: 0 0 - - 0  Injury with Fall? 0 0 - - 0  Follow up Falls evaluation completed Falls evaluation completed - - Falls evaluation completed    FALL RISK PREVENTION PERTAINING TO THE HOME: Handrails in use when climbing stairs? Yes Home free of loose throw rugs in walkways, pet beds, electrical cords, etc? Yes  Adequate lighting in your home to reduce risk of falls? Yes   ASSISTIVE DEVICES UTILIZED TO PREVENT FALLS:  Life alert? No  Use of a cane, walker or w/c? No   TIMED UP AND GO: Was the test performed? No .    Cognitive Function: Patient is alert and oriented x3.  Denies difficulty focusing, making decisions, memory loss.  MMSE/6CIT deferred. Normal by direct communication/observation.  MMSE - Mini Mental State Exam 10/24/2015  Orientation to time 5  Orientation to Place 5  Registration 3  Attention/ Calculation 0  Recall 3  Language- name 2 objects 0  Language- repeat 1  Language- follow 3 step command 3  Language- read & follow direction 0  Write a sentence 0  Copy design 0  Total score 20     6CIT Screen 08/06/2018  What Year? 0 points  What month? 0 points  What time? 0 points  Count back from 20 0 points  Months in reverse 0 points  Repeat phrase 0 points  Total Score 0    Immunizations Immunization History  Administered Date(s) Administered   Fluad Quad(high Dose 65+) 06/22/2020   Influenza Whole 04/19/2007, 03/15/2009, 04/16/2010   Influenza, High Dose Seasonal PF 03/18/2018   Influenza, Seasonal, Injecte, Preservative Fre 03/30/2016   Influenza,inj,Quad PF,6+ Mos 03/23/2015, 03/31/2017, 02/24/2018   Influenza-Unspecified 04/18/2013, 04/16/2014   PFIZER Comirnaty(Gray Top)Covid-19 Tri-Sucrose Vaccine 09/28/2020   PFIZER(Purple Top)SARS-COV-2 Vaccination 07/16/2019, 08/07/2019, 03/13/2020   Pneumococcal Conjugate-13 07/20/2014    Pneumococcal Polysaccharide-23 04/16/2010, 10/24/2015   Td 06/17/2003, 06/29/2013   Zoster Recombinat (Shingrix) 12/09/2017, 03/15/2018, 07/15/2018   Zoster, Live 12/25/2010    Health Maintenance There are no preventive care reminders to display for this patient. Health Maintenance  Topic Date Due   INFLUENZA VACCINE  01/14/2021   FOOT EXAM  01/19/2021   COVID-19 Vaccine (5 - Booster for Pfizer series) 01/28/2021   URINE MICROALBUMIN  03/09/2021   HEMOGLOBIN A1C  04/24/2021   OPHTHALMOLOGY EXAM  11/05/2021   TETANUS/TDAP  06/30/2023   COLONOSCOPY (Pts 45-32yrs Insurance coverage will need to be confirmed)  07/31/2030   Hepatitis C Screening  Completed   PNA vac Low Risk Adult  Completed   Zoster Vaccines- Shingrix  Completed   HPV VACCINES  Aged Out   CT Chest W Contrast: 07/06/20.   Vision Screening: Recommended annual ophthalmology exams for early detection of glaucoma and other disorders of the eye. Is the patient up to date with their annual eye exam?  Yes   Dental Screening: Recommended annual dental exams for proper oral hygiene  Community Resource Referral / Chronic Care Management: CRR required this visit?  No   CCM required this visit?  Yes      Plan:   Keep all routine maintenance appointments.   I have personally reviewed and noted the following in the patient's chart:   Medical and social history Use of alcohol, tobacco or illicit drugs  Current medications and supplements including opioid prescriptions. Patient is not currently taking opioid prescriptions. Functional ability and status Nutritional  status Physical activity Advanced directives List of other physicians Hospitalizations, surgeries, and ER visits in previous 12 months Vitals Screenings to include cognitive, depression, and falls Referrals and appointments  In addition, I have reviewed and discussed with patient certain preventive protocols, quality metrics, and best practice  recommendations. A written personalized care plan for preventive services as well as general preventive health recommendations were provided to patient via mychart.     Varney Biles, LPN   [phone]

## 2020-12-21 DIAGNOSIS — H401133 Primary open-angle glaucoma, bilateral, severe stage: Secondary | ICD-10-CM | POA: Insufficient documentation

## 2020-12-25 DIAGNOSIS — H401133 Primary open-angle glaucoma, bilateral, severe stage: Secondary | ICD-10-CM | POA: Diagnosis not present

## 2020-12-25 DIAGNOSIS — Z885 Allergy status to narcotic agent status: Secondary | ICD-10-CM | POA: Diagnosis not present

## 2020-12-25 DIAGNOSIS — H401113 Primary open-angle glaucoma, right eye, severe stage: Secondary | ICD-10-CM | POA: Diagnosis not present

## 2020-12-25 DIAGNOSIS — E1139 Type 2 diabetes mellitus with other diabetic ophthalmic complication: Secondary | ICD-10-CM | POA: Diagnosis not present

## 2021-01-07 DIAGNOSIS — H903 Sensorineural hearing loss, bilateral: Secondary | ICD-10-CM | POA: Diagnosis not present

## 2021-01-11 DIAGNOSIS — H903 Sensorineural hearing loss, bilateral: Secondary | ICD-10-CM | POA: Diagnosis not present

## 2021-01-15 ENCOUNTER — Telehealth: Payer: Self-pay | Admitting: Pharmacist

## 2021-01-15 ENCOUNTER — Telehealth: Payer: PPO

## 2021-01-15 NOTE — Telephone Encounter (Signed)
  Chronic Care Management   Note  01/15/2021 Name: SABASTAIN SIDOR MRN: [id number] DOB: 07/19/1945   Attempted to contact patient for scheduled appointment for medication management support. Unable to leave voicemail for patient.  Plan: - If I do not hear back from the patient by end of business today, will collaborate with Care Guide to outreach to schedule follow up with me   Catie Darnelle Maffucci, PharmD, [place], [place] Pharmacist [employer] [facility] [employer] [phone]

## 2021-01-16 ENCOUNTER — Telehealth: Payer: Self-pay

## 2021-01-16 ENCOUNTER — Other Ambulatory Visit: Payer: Self-pay

## 2021-01-16 NOTE — Telephone Encounter (Deleted)
Error

## 2021-01-16 NOTE — Telephone Encounter (Addendum)
Error

## 2021-01-21 ENCOUNTER — Ambulatory Visit (INDEPENDENT_AMBULATORY_CARE_PROVIDER_SITE_OTHER): Payer: PPO | Admitting: Pharmacist

## 2021-01-21 DIAGNOSIS — E785 Hyperlipidemia, unspecified: Secondary | ICD-10-CM

## 2021-01-21 DIAGNOSIS — M791 Myalgia, unspecified site: Secondary | ICD-10-CM

## 2021-01-21 DIAGNOSIS — T466X5A Adverse effect of antihyperlipidemic and antiarteriosclerotic drugs, initial encounter: Secondary | ICD-10-CM

## 2021-01-21 DIAGNOSIS — Z794 Long term (current) use of insulin: Secondary | ICD-10-CM | POA: Diagnosis not present

## 2021-01-21 DIAGNOSIS — E119 Type 2 diabetes mellitus without complications: Secondary | ICD-10-CM | POA: Diagnosis not present

## 2021-01-21 NOTE — Chronic Care Management (AMB) (Signed)
Chronic Care Management Pharmacy Note  01/21/2021 Name:  Alexis Sharp MRN:  [id number] DOB:  05-04-1946   Subjective: Alexis Sharp is an 75 y.o. year old male who is a primary patient of McLean-Scocuzza, Nino Glow, MD.  The CCM team was consulted for assistance with disease management and care coordination needs.    Engaged with patient by telephone for initial visit in response to provider referral for pharmacy case management and/or care coordination services.   Consent to Services:  The patient was given the following information about Chronic Care Management services today, agreed to services, and gave verbal consent: 1. CCM service includes personalized support from designated clinical staff supervised by the primary care provider, including individualized plan of care and coordination with other care providers 2. 24/7 contact phone numbers for assistance for urgent and routine care needs. 3. Service will only be billed when office clinical staff spend 20 minutes or more in a month to coordinate care. 4. Only one practitioner may furnish and bill the service in a calendar month. 5.The patient may stop CCM services at any time (effective at the end of the month) by phone call to the office staff. 6. The patient will be responsible for cost sharing (co-pay) of up to 20% of the service fee (after annual deductible is met). Patient agreed to services and consent obtained.  Patient Care Team: McLean-Scocuzza, Nino Glow, MD as PCP - General (Internal Medicine) Marylynn Pearson, MD as Consulting Physician (Ophthalmology) De Hollingshead, RPH-CPP (Pharmacist)  Recent office visits: 5/11 - PCP visit,  6.6%, eGFR 82, LDL 119, added pravastatin 10 mg TIW   Objective:  Lab Results  Component Value Date   CREATININE 0.92 10/22/2020   CREATININE 0.91 08/31/2020   CREATININE 0.96 08/30/2020    Lab Results  Component Value Date   HGBA1C 6.6 (H) 10/22/2020   Last diabetic Eye exam:   Lab Results  Component Value Date/Time   HMDIABEYEEXA No Retinopathy 11/05/2020 12:00 AM    Last diabetic Foot exam:  Lab Results  Component Value Date/Time   HMDIABFOOTEX done 10/24/2016 12:00 AM        Component Value Date/Time   CHOL 175 10/22/2020 0733   TRIG 79.0 10/22/2020 0733   HDL 40.50 10/22/2020 0733   CHOLHDL 4 10/22/2020 0733   VLDL 15.8 10/22/2020 0733   LDLCALC 119 (H) 10/22/2020 0733   LDLDIRECT 155.7 02/24/2012 1246    Hepatic Function Latest Ref Rng & Units 10/22/2020 03/09/2020 01/08/2018  Total Protein 6.0 - 8.3 g/dL 6.6 7.0 6.9  Albumin 3.5 - 5.2 g/dL 4.3 4.6 4.5  AST 0 - 37 U/L _0 ALT 0 - 53 U/L _1 Alk Phosphatase 39 - 117 U/L 51 52 46  Total Bilirubin 0.2 - 1.2 mg/dL 0.6 0.7 0.6  Bilirubin, Direct 0.0 - 0.3 mg/dL - - -    Lab Results  Component Value Date/Time   TSH 1.81 03/09/2020 09:22 AM   TSH 1.63 01/08/2018 08:54 AM   FREET4 0.76 10/24/2016 12:28 PM   FREET4 0.79 10/24/2015 08:39 AM    CBC Latest Ref Rng & Units 10/22/2020 08/30/2020 08/28/2020  WBC 4.0 - 10.5 K/uL 4.6 11.3(H) 5.4  Hemoglobin 13.0 - 17.0 g/dL 15.4 15.7 15.1  Hematocrit 39.0 - 52.0 % 45.5 46.7 44.4  Platelets 150.0 - 400.0 K/uL 212.0 168 209    Lab Results  Component Value Date/Time   VD25OH 35.27 01/08/2018 08:54 AM  Clinical ASCVD: No  The 10-year ASCVD risk score (Goff DC Jr., et al., 2013) is: 50.1%   Values used to calculate the score:     Age: 75 years     Sex: Male     Is Non-Hispanic African American: No     Diabetic: Yes     Tobacco smoker: No     Systolic Blood Pressure: 142 mmHg     Is BP treated: Yes     HDL Cholesterol: 40.5 mg/dL     Total Cholesterol: 175 mg/dL      Social History   Tobacco Use  Smoking Status Former  Smokeless Tobacco Never  Tobacco Comments   smoked some as teenager   BP Readings from Last 3 Encounters:  10/24/20 108/74  09/13/20 131/75  09/03/20 140/89   Pulse Readings from Last 3 Encounters:   10/24/20 (!) 58  09/13/20 60  09/03/20 76   Wt Readings from Last 3 Encounters:  12/12/20 190 lb (86.2 kg)  10/24/20 196 lb (88.9 kg)  09/13/20 196 lb 9.6 oz (89.2 kg)    Assessment: Review of patient past medical history, allergies, medications, health status, including review of consultants reports, laboratory and other test data, was performed as part of comprehensive evaluation and provision of chronic care management services.   SDOH:  (Social Determinants of Health) assessments and interventions performed:  SDOH Interventions    Flowsheet Row Most Recent Value  SDOH Interventions   Financial Strain Interventions Other (Comment)  [over income for assistance]       CCM Care Plan  Allergies  Allergen Reactions   Codeine    Pioglitazone     REACTION: achy   Sitagliptin Phosphate     REACTION: muscle aches   Atorvastatin Other (See Comments)    myalgia   Crestor [Rosuvastatin]     Muscle aching   Tape Rash    Medications Reviewed Today     Reviewed by O'Brien-Blaney, Denisa L, LPN (Licensed Practical Nurse) on 12/12/20 at 1033  Med List Status: <None>   Medication Order Taking? Sig Documenting Provider Last Dose Status Informant  azelastine (ASTELIN) 0.1 % nasal spray [id number]  Place 2 sprays into both nostrils 2 (two) times daily as needed for rhinitis. Use in each nostril as directed McLean-Scocuzza, Tracy N, MD  Active   bimatoprost (LUMIGAN) 0.01 % SOLN [id number] No Place 1 drop into both eyes at bedtime.  [provider] Taking Active Self  Continuous Blood Gluc Receiver (FREESTYLE LIBRE 2 READER) DEVI [id number] No 1 Device by Does not apply route every 14 (fourteen) days. McLean-Scocuzza, Tracy N, MD Taking Active   Continuous Blood Gluc Sensor (FREESTYLE LIBRE 2 SENSOR) MISC [id number] No 1 Device by Does not apply route every 14 (fourteen) days. McLean-Scocuzza, Tracy N, MD Taking Active   empagliflozin (JARDIANCE) 25 MG TABS tablet [id number] No Take  1 tablet (25 mg total) by mouth daily. McLean-Scocuzza, Tracy N, MD Taking Active Self  glucose blood (ONE TOUCH ULTRA TEST) test strip [id number] No USE ONE STRIP TO CHECK GLUCOSE bid McLean-Scocuzza, Tracy N, MD Taking Active Self  insulin glargine (LANTUS SOLOSTAR) 100 UNIT/ML Solostar Pen [id number]  Inject 22-30 Units into the skin at bedtime. avg units 28 McLean-Scocuzza, Tracy N, MD  Active   Insulin Pen Needle (PEN NEEDLES) 31G X 6 MM MISC [id number] No 1 Device by Does not apply route 3 (three) times daily as needed. McLean-Scocuzza, Tracy N, MD Taking Active Self  Insulin Pen Needle (PEN   NEEDLES) 32G X 4 MM MISC [id number] No 1 Units by Does not apply route daily. Use 1 pen needle daily with Lantus Letvak, Richard I, MD Taking Active Self  metFORMIN (GLUCOPHAGE) 500 MG tablet [id number] No Take 1-2 tablets (500-1,000 mg total) by mouth See admin instructions. Take 500 mg by mouth in am with food and 1000 mg in the evening with food McLean-Scocuzza, Tracy N, MD Taking Active   Multiple Vitamin (MULTIVITAMIN) capsule [id number] No Take 1 capsule by mouth daily. [provider] Taking Active Self  mupirocin ointment (BACTROBAN) 2 % [id number]  Apply 1 application topically 2 (two) times daily. McLean-Scocuzza, Tracy N, MD  Active   ONETOUCH DELICA LANCETS FINE MISC [id number] No Use to check blood sugar once a day. Dx Code E11.9 Letvak, Richard I, MD Taking Active Self  pravastatin (PRAVACHOL) 10 MG tablet [id number]  Take 1 tablet (10 mg total) by mouth 3 (three) times a week. McLean-Scocuzza, Tracy N, MD  Active   timolol (TIMOPTIC) 0.5 % ophthalmic solution [id number] No Place 1 drop into both eyes daily. [provider] Taking Active Self           Med Note (LORING, DONNA S   Tue May 29, 2015  3:12 PM) Received from: External Pharmacy            Patient Active Problem List   Diagnosis Date Noted   S/P repair of paraesophageal hernia 08/30/2020   Statin myopathy 01/20/2020    Hypertension associated with diabetes (HCC) 01/20/2020   Overweight (BMI 25.0-29.9) 01/20/2020   HTN (hypertension) 08/06/2018   Chronic sinusitis 08/06/2018   Proteinuria 03/18/2018   GERD (gastroesophageal reflux disease) 01/27/2018   Allergic rhinitis 12/27/2017   Post-nasal drip 12/27/2017   Sensory loss 12/02/2016   Type 2 diabetes mellitus without complication, with long-term current use of insulin (HCC) 10/18/2015   Reflux esophagitis 07/20/2014   Advance directive discussed with patient 07/20/2014   Annual physical exam 12/25/2010   MIGRAINE HEADACHE 11/01/2007   Hyperlipemia 04/19/2007   Glaucoma of both eyes 04/19/2007    Immunization History  Administered Date(s) Administered   Fluad Quad(Sharp Dose 65+) 06/22/2020   Influenza Whole 04/19/2007, 03/15/2009, 04/16/2010   Influenza, Sharp Dose Seasonal PF 03/18/2018   Influenza, Seasonal, Injecte, Preservative Fre 03/30/2016   Influenza,inj,Quad PF,6+ Mos 03/23/2015, 03/31/2017, 02/24/2018   Influenza-Unspecified 04/18/2013, 04/16/2014   PFIZER Comirnaty(Gray Top)Covid-19 Tri-Sucrose Vaccine 09/28/2020   PFIZER(Purple Top)SARS-COV-2 Vaccination 07/16/2019, 08/07/2019, 03/13/2020   Pneumococcal Conjugate-13 07/20/2014   Pneumococcal Polysaccharide-23 04/16/2010, 10/24/2015   Td 06/17/2003, 06/29/2013   Zoster Recombinat (Shingrix) 12/09/2017, 03/15/2018, 07/15/2018   Zoster, Live 12/25/2010    Conditions to be addressed/monitored: HTN, HLD, and DMII  Care Plan : Medication Management  Updates made by Travis, Catherine E, RPH-CPP since 01/21/2021 12:00 AM     Problem: Diabetes, HLD      Long-Range Goal: Disease Progression Prevention   Start Date: 01/21/2021  This Visit's Progress: On track  Priority: Sharp  Note:   Current Barriers:  Unable to independently afford treatment regimen Unable to achieve control of hyperlipidemia   Pharmacist Clinical Goal(s):  Over the next 90 days, patient will achieve adherence to  monitoring guidelines and medication adherence to achieve therapeutic efficacy through collaboration with PharmD and provider.   Interventions: 1:1 collaboration with McLean-Scocuzza, Tracy N, MD regarding development and update of comprehensive plan of care as evidenced by provider attestation and co-signature Inter-disciplinary care team collaboration (see longitudinal plan of care) Comprehensive medication review   performed; medication list updated in electronic medical record  Diabetes: Controlled; current treatment: metformin 500 mg QAM, 1000 mg QPM; Jardiance 25 mg daily, Lantus 28 units daily; Reports that he gets Jardiance from [place] due to the cost and wanting to prevent hitting the Coverage Gap. Also wonders why Jardiance is beneficial.   Discussed income. Unfortunately, patient is over income for Jardiance (and Farxiga) assistance, as well as insulin assistance. Encouraged to let us know if anything changes with his income. Advised that we do not recommend receiving medications from other countries due to lack of FDA oversight. Discussed consideration for alternative insurance plans that provide better coverage moving forward. Also advised on availability of Synjardy (combination metformin + Jardiance).  Recommended to continue current regimen at this time  Hyperlipidemia: Uncontrolled current treatment: none - prescribed pravastatin 10 mg three days weekly, but he declined to take; Reports he doesn't understand why he needs a cholesterol medication given his baseline cholesterol  Medications previously tried: rosuvastatin, atorvastatin, simvastatin - all resulted in myalgias Educated on plaque stabilization benefit of statin therapy, even if LDL at goal. Advised that a general LDL goal for those with DM would be <100, if not <70 given other risk factors. Consider coronary artery calcium scoring to further evaluate CV risk. If elevated, more aggressive LDL reduction (ie, with PCSK9i  agents) may be appropriate. If low CAC score, deference of statin therapy would be appropriate.  Supplements: Vitamin D, probiotic   Patient Goals/Self-Care Activities Over the next 90 days, patient will:  - take medications as prescribed check glucose daily, document, and provide at future appointments  Follow Up Plan: Telephone follow up appointment with care management team member scheduled for: patient declines scheduling follow up at this time      Medication Assistance:  Over income for assistance  Patient's preferred pharmacy is:  [id number] - [place], [place] - [address] [place] [address] Phone: [phone] Fax: [phone]   Follow Up:  Patient requests no follow-up at this time.  Plan: Patient has my contact information for future questions or concenrs  Catie Travis, PharmD, BCACP, CPP Clinical Pharmacist [facility] [place] [phone]      

## 2021-01-21 NOTE — Patient Instructions (Signed)
Visit Information   PATIENT GOALS:   Goals Addressed               This Visit's Progress     Patient Stated     Medication Monitoring (pt-stated)        Patient Goals/Self-Care Activities Over the next 90 days, patient will:  - take medications as prescribed check glucose daily, document, and provide at future appointments        Consent to CCM Services: Mr. Fischler was given information about Chronic Care Management services including:  CCM service includes personalized support from designated clinical staff supervised by his physician, including individualized plan of care and coordination with other care providers 24/7 contact phone numbers for assistance for urgent and routine care needs. Service will only be billed when office clinical staff spend 20 minutes or more in a month to coordinate care. Only one practitioner may furnish and bill the service in a calendar month. The patient may stop CCM services at any time (effective at the end of the month) by phone call to the office staff. The patient will be responsible for cost sharing (co-pay) of up to 20% of the service fee (after annual deductible is met).  Patient agreed to services and verbal consent obtained.   Patient verbalizes understanding of instructions provided today and agrees to view in [place].    Plan: Patient has my contact information for future questions or concenrs  Catie Darnelle Maffucci, PharmD, [place], CPP Clinical Pharmacist [place] [facility]: Patient Care Plan: Medication Management     Problem Identified: Diabetes, HLD      Long-Range Goal: Disease Progression Prevention   Start Date: 01/21/2021  This Visit's Progress: On track  Priority: High  Note:   Current Barriers:  Unable to independently afford treatment regimen Unable to achieve control of hyperlipidemia   Pharmacist Clinical Goal(s):  Over the next 90 days, patient will  achieve adherence to monitoring guidelines and medication adherence to achieve therapeutic efficacy through collaboration with PharmD and provider.   Interventions: 1:1 collaboration with McLean-Scocuzza, Nino Glow, MD regarding development and update of comprehensive plan of care as evidenced by provider attestation and co-signature Inter-disciplinary care team collaboration (see longitudinal plan of care) Comprehensive medication review performed; medication list updated in electronic medical record  Diabetes: Controlled; current treatment: metformin 500 mg QAM, 1000 mg QPM; Jardiance 25 mg daily, Lantus 28 units daily; Reports that he gets [place] from [place] due to the cost and wanting to prevent hitting the Coverage Gap. Also wonders why Vania Rea is beneficial.   Discussed income. Unfortunately, patient is over income for Vania Re[place] ([place] assistance, as well as insulin assistance. Encouraged to let us know if anything changes with his income. Advised that we do not recommend receiving medications from other countries due to lack of FDA oversight. Discussed consideration for alternative insurance plans that provide better coverage moving forward. Also advised on availability of Synjardy (combination metformin + Jardiance).  Recommended to continue current regimen at this time  Hyperlipidemia: Uncontrolled current treatment: none - prescribed pravastatin 10 mg three days weekly, but he declined to take; Reports he doesn't understand why he needs a cholesterol medication given his baseline cholesterol  Medications previously tried: rosuvastatin, atorvastatin, simvastatin - all resulted in myalgias Educated on plaque stabilization benefit of statin therapy, even if LDL at goal. Advised that a general LDL goal for those with DM would be <100, if not <70 given other risk factors.  Consider coronary artery calcium scoring to further evaluate CV risk. If elevated, more aggressive LDL reduction  (ie, with PCSK9i agents) may be appropriate. If low CAC score, deference of statin therapy would be appropriate.  Supplements: Vitamin D, probiotic   Patient Goals/Self-Care Activities Over the next 90 days, patient will:  - take medications as prescribed check glucose daily, document, and provide at future appointments  Follow Up Plan: Telephone follow up appointment with care management team member scheduled for: patient declines scheduling follow up at this time

## 2021-01-22 ENCOUNTER — Telehealth: Payer: Self-pay | Admitting: Cardiovascular Disease

## 2021-01-22 ENCOUNTER — Telehealth: Payer: PPO

## 2021-01-22 ENCOUNTER — Other Ambulatory Visit: Payer: Self-pay

## 2021-01-22 DIAGNOSIS — Z83438 Family history of other disorder of lipoprotein metabolism and other lipidemia: Secondary | ICD-10-CM

## 2021-01-23 DIAGNOSIS — J324 Chronic pansinusitis: Secondary | ICD-10-CM | POA: Diagnosis not present

## 2021-01-23 NOTE — Telephone Encounter (Addendum)
Late Entry 01/23/21 1435   Patient Wife calling to go over ct instructions.  Scheduled for 8/11

## 2021-01-23 NOTE — Telephone Encounter (Signed)
Was able to reach back out to Alexis Sharp after he called in requiring CT instructions. Advised no instructions or pre-procedure protocols for the CT Calcium Score. Test was quic and noninvasive way of evaluating the amount of calcified (hard) plaque in your heart vessel.   Advised will see results on MyChart, then will get a phone call to review results once Dr. Rockey Situ is recommendations. Pt verbalized understanding.  Currently schedule  for 8/11, but wanting to reschedule d/t trip to [place], advised that was okay, no rush, has 1-year to complete order.   Alexis Sharp thankful for the return phone call, nothing further at this time.

## 2021-01-24 ENCOUNTER — Ambulatory Visit: Payer: PPO

## 2021-02-12 ENCOUNTER — Other Ambulatory Visit: Payer: Self-pay

## 2021-02-12 ENCOUNTER — Ambulatory Visit: Payer: PPO | Admitting: Dermatology

## 2021-02-12 DIAGNOSIS — L570 Actinic keratosis: Secondary | ICD-10-CM

## 2021-02-12 DIAGNOSIS — L219 Seborrheic dermatitis, unspecified: Secondary | ICD-10-CM

## 2021-02-12 MED ORDER — KETOCONAZOLE 2 % EX CREA
TOPICAL_CREAM | CUTANEOUS | 3 refills | Status: DC
Start: 2021-02-12 — End: 2022-05-03

## 2021-02-12 MED ORDER — HYDROCORTISONE 2.5 % EX CREA
TOPICAL_CREAM | CUTANEOUS | 3 refills | Status: DC
Start: 2021-02-12 — End: 2022-05-03

## 2021-02-12 NOTE — Progress Notes (Signed)
Follow-Up Visit   Subjective  Alexis Sharp is a 75 y.o. male who presents for the following: Follow-up (F/u for AKs treated with Ln2 at last visit. ) and other (Pt complains of dryness, itching, redness on penis x approx 1 week. Pt states that he has been using his wife's clobetasol cream on the area. ).  Also, penis has been dry, red x approx 1 week. Using clobetasol cream.     The following portions of the chart were reviewed this encounter and updated as appropriate:      Review of Systems: No other skin or systemic complaints except as noted in HPI or Assessment and Plan.   Objective  Well appearing patient in no apparent distress; mood and affect are within normal limits.  A focused examination was performed including face, scalp. groin. Relevant physical exam findings are noted in the Assessment and Plan.  left ear, left cheek, left temple, R preauricular Pink patches with greasy scale.   Pink patch, no scale on distal shaft of penis  Scalp clear  Left zygoma x 1, left temple x 1 (2) Erythematous thin papules/macules with gritty scale.   Assessment & Plan  Seborrheic dermatitis left ear, left cheek, left temple, R preauricular  With flare  D/C Clobetasol cream to penis due to risk of skin atrophy  Start ketoconazole cream. Apply to affected areas on face and penis 1-2 times per day. May use daily.   Start Hydrocortisone 2.5 % cream. Apply QD/BID to affected areas on face and penis as needed for flares of dryness, itch, and flaking.   Seborrheic Dermatitis  -  is a chronic persistent rash characterized by pinkness and scaling most commonly of the mid face but also can occur on the scalp (dandruff), ears; mid chest, mid back, and groin. It tends to be exacerbated by stress and cooler weather.  People who have neurologic disease may experience new onset or exacerbation of existing seborrheic dermatitis.  The condition is not curable but treatable and can be  controlled.   ketoconazole (NIZORAL) 2 % cream - left ear, left cheek, left temple, R preauricular Apply to affected areas on face and penis 1-2 times per day. May use daily.  hydrocortisone 2.5 % cream - left ear, left cheek, left temple, R preauricular Apply BID to affected areas on face and penis as needed for flares of dryness, itch, and flaking.  AK (actinic keratosis) (2) Left zygoma x 1, left temple x 1  Actinic keratoses are precancerous spots that appear secondary to cumulative UV radiation exposure/sun exposure over time. They are chronic with expected duration over 1 year. A portion of actinic keratoses will progress to squamous cell carcinoma of the skin. It is not possible to reliably predict which spots will progress to skin cancer and so treatment is recommended to prevent development of skin cancer.  Recommend daily broad spectrum sunscreen SPF 30+ to sun-exposed areas, reapply every 2 hours as needed.  Recommend staying in the shade or wearing long sleeves, sun glasses (UVA+UVB protection) and wide brim hats (4-inch brim around the entire circumference of the hat). Call for new or changing lesions.  Prior to procedure, discussed risks of blister formation, small wound, skin dyspigmentation, or rare scar following cryotherapy. Recommend Vaseline ointment to treated areas while healing.   Destruction of lesion - Left zygoma x 1, left temple x 1  Destruction method: cryotherapy   Informed consent: discussed and consent obtained   Lesion destroyed using liquid nitrogen:  Yes   Region frozen until ice ball extended beyond lesion: Yes   Outcome: patient tolerated procedure well with no complications   Post-procedure details: wound care instructions given    Return in about 6 months (around 08/13/2021) for UBSE.  I, Harriett Sine, CMA, am acting as scribe for Brendolyn Patty, MD.  Documentation: I have reviewed the above documentation for accuracy and completeness, and I agree  with the above.  Brendolyn Patty MD

## 2021-02-12 NOTE — Patient Instructions (Addendum)
Cryotherapy Aftercare Wash gently with soap and water everyday.   Apply Vaseline and Band-Aid daily until healed.    Seborrheic Dermatitis  Mix hydrocortisone 2.5% with ketaconazole 2% twice a day face and groin. If improved, decrease to hydrocortisone and ketaconazole mixed once a day. If still clear, decrease to ketaconazole only.    If you have any questions or concerns for your doctor, please call our main line at [phone] and press option 4 to reach your doctor's medical assistant. If no one answers, please leave a voicemail as directed and we will return your call as soon as possible. Messages left after 4 pm will be answered the following business day.   You may also send [place] a message via [place]. We typically respond to MyChart messages within 1-2 business days.  For prescription refills, please ask your pharmacy to contact our office. Our fax number is [phone].  If you have an urgent issue when the clinic is closed that cannot wait until the next business day, you can page your doctor at the number below.    Please note that while we do our best to be available for urgent issues outside of office hours, we are not available 24/7.   If you have an urgent issue and are unable to reach [place], you may choose to seek medical care at your doctor's office, retail clinic, urgent care center, or emergency room.  If you have a medical emergency, please immediately call 911 or go to the emergency department.  Pager Numbers  - Dr. Nehemiah Massed: [phone]  - Dr. Laurence Ferrari: [phone]  - Dr. Nicole Kindred: [phone]  In the event of inclement weather, please call our main line at [phone] for an update on the status of any delays or closures.  Dermatology Medication Tips: Please keep the boxes that topical medications come in in order to help keep track of the instructions about where and how to use these. Pharmacies typically print the medication instructions only on the boxes and  not directly on the medication tubes.   If your medication is too expensive, please contact our office at [phone] option 4 or send [place] a message through [place].   We are unable to tell what your co-pay for medications will be in advance as this is different depending on your insurance coverage. However, we may be able to find a substitute medication at lower cost or fill out paperwork to get insurance to cover a needed medication.   If a prior authorization is required to get your medication covered by your insurance company, please allow [place] 1-2 business days to complete this process.  Drug prices often vary depending on where the prescription is filled and some pharmacies may offer cheaper prices.  The website www.goodrx.com contains coupons for medications through different pharmacies. The prices here do not account for what the cost may be with help from insurance (it may be cheaper with your insurance), but the website can give you the price if you did not use any insurance.  - You can print the associated coupon and take it with your prescription to the pharmacy.  - You may also stop by our office during regular business hours and pick up a GoodRx coupon card.  - If you need your prescription sent electronically to a different pharmacy, notify our office through [facility] or by phone at [phone] option 4.

## 2021-02-13 ENCOUNTER — Other Ambulatory Visit: Payer: Self-pay

## 2021-02-13 ENCOUNTER — Inpatient Hospital Stay: Admission: RE | Admit: 2021-02-13 | Payer: PPO | Source: Ambulatory Visit

## 2021-02-13 DIAGNOSIS — Z794 Long term (current) use of insulin: Secondary | ICD-10-CM

## 2021-02-13 MED ORDER — LANTUS SOLOSTAR 100 UNIT/ML ~~LOC~~ SOPN: 28.0000 [IU] | PEN_INJECTOR | Freq: Every day | SUBCUTANEOUS | 1 refills | Status: DC

## 2021-02-15 ENCOUNTER — Other Ambulatory Visit: Payer: Self-pay

## 2021-02-15 ENCOUNTER — Ambulatory Visit
Admission: RE | Admit: 2021-02-15 | Discharge: 2021-02-15 | Disposition: A | Discharge status: Home / Self Care | Payer: PPO | Source: Ambulatory Visit | Attending: Cardiovascular Disease | Admitting: Cardiovascular Disease

## 2021-02-15 DIAGNOSIS — Z83438 Family history of other disorder of lipoprotein metabolism and other lipidemia: Secondary | ICD-10-CM | POA: Insufficient documentation

## 2021-02-22 NOTE — Telephone Encounter (Signed)
Patient calling to get explanation of results received on MyChart

## 2021-02-22 NOTE — Telephone Encounter (Signed)
Was able to reach back out to Alexis Sharp, advised reading radiologist has read his CT calcium score, report has ben sent to Dr. Rockey Situ for review and recommendations. Results are in his MyChart, as he has seen.  Advised will call back once Dr. Rockey Situ has gave his advise and recommendations, however, preliminary results reviewed by nurse.   Alexis Sharp thankful for the return call and will await call back of official results.

## 2021-02-26 ENCOUNTER — Telehealth: Payer: Self-pay

## 2021-02-26 NOTE — Telephone Encounter (Signed)
Able to reach pt regarding his recent CT Calcium Dr. Rockey Situ had a chance to review his results and advised   "CT coronary calcium scoring  Calcium score 583, placing him at 71st percentile for age  Calcium noted in all 3 vessels/coronary arteries  Would consider establishing in cardiology clinic for close follow-up with primary care for management of cholesterol    LDL should be less than 70, currently is 119 "  Alexis Sharp very thankful for the phone call of his results, all questions and concerns were address with nothing further at this time.   Appt with Dr. Rockey Situ on 10/21 at 2 pm as New Patient.

## 2021-02-27 DIAGNOSIS — J329 Chronic sinusitis, unspecified: Secondary | ICD-10-CM | POA: Diagnosis not present

## 2021-02-27 DIAGNOSIS — H409 Unspecified glaucoma: Secondary | ICD-10-CM | POA: Insufficient documentation

## 2021-02-27 DIAGNOSIS — J310 Chronic rhinitis: Secondary | ICD-10-CM | POA: Diagnosis not present

## 2021-02-27 HISTORY — DX: Unspecified glaucoma: H40.9

## 2021-04-05 ENCOUNTER — Other Ambulatory Visit: Payer: Self-pay

## 2021-04-05 ENCOUNTER — Ambulatory Visit: Payer: PPO | Admitting: Cardiovascular Disease

## 2021-04-05 ENCOUNTER — Other Ambulatory Visit (INDEPENDENT_AMBULATORY_CARE_PROVIDER_SITE_OTHER): Payer: PPO

## 2021-04-05 ENCOUNTER — Encounter: Payer: Self-pay | Admitting: Cardiovascular Disease

## 2021-04-05 VITALS — BP 126/80 | HR 62 | Ht 71.5 in | Wt 197.2 lb

## 2021-04-05 DIAGNOSIS — E119 Type 2 diabetes mellitus without complications: Secondary | ICD-10-CM | POA: Diagnosis not present

## 2021-04-05 DIAGNOSIS — E785 Hyperlipidemia, unspecified: Secondary | ICD-10-CM

## 2021-04-05 DIAGNOSIS — I2510 Atherosclerotic heart disease of native coronary artery without angina pectoris: Secondary | ICD-10-CM | POA: Diagnosis not present

## 2021-04-05 DIAGNOSIS — Z794 Long term (current) use of insulin: Secondary | ICD-10-CM

## 2021-04-05 DIAGNOSIS — Z125 Encounter for screening for malignant neoplasm of prostate: Secondary | ICD-10-CM | POA: Diagnosis not present

## 2021-04-05 DIAGNOSIS — Z1329 Encounter for screening for other suspected endocrine disorder: Secondary | ICD-10-CM

## 2021-04-05 DIAGNOSIS — Z0000 Encounter for general adult medical examination without abnormal findings: Secondary | ICD-10-CM

## 2021-04-05 DIAGNOSIS — I10 Essential (primary) hypertension: Secondary | ICD-10-CM | POA: Diagnosis not present

## 2021-04-05 DIAGNOSIS — I2584 Coronary atherosclerosis due to calcified coronary lesion: Secondary | ICD-10-CM | POA: Diagnosis not present

## 2021-04-05 LAB — COMPREHENSIVE METABOLIC PANEL
ALT: 12 U/L (ref 0–53)
AST: 15 U/L (ref 0–37)
Albumin: 4.6 g/dL (ref 3.5–5.2)
Alkaline Phosphatase: 60 U/L (ref 39–117)
BUN: 21 mg/dL (ref 6–23)
CO2: 28 mEq/L (ref 19–32)
Calcium: 9.3 mg/dL (ref 8.4–10.5)
Chloride: 101 mEq/L (ref 96–112)
Creatinine, Ser: 0.88 mg/dL (ref 0.40–1.50)
GFR: 85.31 mL/min (ref >60.00)
Glucose, Bld: 110 mg/dL — ABNORMAL HIGH (ref 70–99)
Potassium: 5 mEq/L (ref 3.5–5.1)
Sodium: 138 mEq/L (ref 135–145)
Total Bilirubin: 0.7 mg/dL (ref 0.2–1.2)
Total Protein: 7 g/dL (ref 6.0–8.3)

## 2021-04-05 LAB — TSH: TSH: 1.88 u[IU]/mL (ref 0.35–5.50)

## 2021-04-05 LAB — LIPID PANEL
Cholesterol: 154 mg/dL (ref 0–200)
HDL: 34.4 mg/dL — ABNORMAL LOW (ref >39.00)
LDL Cholesterol: 101 mg/dL — ABNORMAL HIGH (ref 0–99)
NonHDL: 119.38
Total CHOL/HDL Ratio: 4
Triglycerides: 90 mg/dL (ref 0.0–149.0)
VLDL: 18 mg/dL (ref 0.0–40.0)

## 2021-04-05 LAB — CBC WITH DIFFERENTIAL/PLATELET
Basophils Absolute: 0 10*3/uL (ref 0.0–0.1)
Basophils Relative: 0.8 % (ref 0.0–3.0)
Eosinophils Absolute: 0.1 10*3/uL (ref 0.0–0.7)
Eosinophils Relative: 1.2 % (ref 0.0–5.0)
HCT: 46 % (ref 39.0–52.0)
Hemoglobin: 15.6 g/dL (ref 13.0–17.0)
Lymphocytes Relative: 28.7 % (ref 12.0–46.0)
Lymphs Abs: 1.7 10*3/uL (ref 0.7–4.0)
MCHC: 33.9 g/dL (ref 30.0–36.0)
MCV: 96.1 fl (ref 78.0–100.0)
Monocytes Absolute: 0.6 10*3/uL (ref 0.1–1.0)
Monocytes Relative: 10.8 % (ref 3.0–12.0)
Neutro Abs: 3.4 10*3/uL (ref 1.4–7.7)
Neutrophils Relative %: 58.5 % (ref 43.0–77.0)
Platelets: 232 10*3/uL (ref 150.0–400.0)
RBC: 4.79 Mil/uL (ref 4.22–5.81)
RDW: 13.5 % (ref 11.5–15.5)
WBC: 5.9 10*3/uL (ref 4.0–10.5)

## 2021-04-05 LAB — HEMOGLOBIN A1C: Hgb A1c MFr Bld: 6 % (ref 4.6–6.5)

## 2021-04-05 LAB — PSA: PSA: 0.6 ng/mL (ref 0.10–4.00)

## 2021-04-05 MED ORDER — EZETIMIBE 10 MG PO TABS: 10.0000 mg | ORAL_TABLET | Freq: Every day | ORAL | 3 refills | Status: DC

## 2021-04-05 MED ORDER — ROSUVASTATIN CALCIUM 5 MG PO TABS: 5.0000 mg | ORAL_TABLET | Freq: Every day | ORAL | 3 refills | Status: DC

## 2021-04-05 NOTE — Patient Instructions (Addendum)
Medication Instructions:  Please START  zetia 10 mg crestor 5 mg daily (start slowly)  If you need a refill on your cardiac medications before your next appointment, please call your pharmacy.   Lab work: No new labs needed  Testing/Procedures: No new testing needed  Follow-Up: At [facility], you and your health needs are our priority.  As part of our continuing mission to provide you with exceptional heart care, we have created designated Provider Care Teams.  These Care Teams include your primary Cardiologist (physician) and Advanced Practice Providers (APPs -  Physician Assistants and Nurse Practitioners) who all work together to provide you with the care you need, when you need it.  You will need a follow up appointment as needed  Providers on your designated Care Team:   Murray Hodgkins, NP Christell Faith, PA-C Cadence Kathlen Mody, [place]  COVID-19 Vaccine Information can be found at: [email or web address] For questions related to vaccine distribution or appointments, please email [email or web address] or call [phone].

## 2021-04-05 NOTE — Progress Notes (Signed)
Cardiology Office Note  Date:  04/05/2021   ID:  Alexis Sharp, DOB 01/19/1946, MRN [id number]  PCP:  McLean-Scocuzza, Nino Glow, MD   Chief Complaint  Patient presents with   New Patient (Initial Visit)    Follow up CT calcium score. Medications reviewed by the patient verbally.     HPI:  Alexis Sharp is a 75 year old gentleman with past medical history of Type 2 diabetes Coronary calcification Who presents for new patient evaluation for evaluation of his coronary calcification  At baseline reports feeling well, active, able to ambulate without symptoms of shortness of breath or chest pain Wife is very active, does activities with her, walking, shopping etc.  In the past has tried Lipitor 10, had myalgias  Recent change to diet, improved A1c as detailed below  CT coronary calcium scoring, images pulled up and reviewed 1. Coronary calcium score of 583. This was 71st percentile for age and sex matched control. 2. CAC >300 in LM, LAD, LCx, RCA. CAC-DRS A3/N3.   Lab work reviewed Total cholesterol 154, LDL 101 A1c 6.0 down from 6.8  EKG personally reviewed by myself on todays visit Normal sinus rhythm rate 62 bpm no significant ST-T wave changes  PMH:   has a past medical history of Allergy, Diabetes mellitus, Glaucoma, Glaucoma, Hyperlipidemia, Migraines, Reflux esophagitis, 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  PSH:    Past Surgical History:  Procedure Laterality Date   CATARACT EXTRACTION, BILATERAL     COLONOSCOPY WITH PROPOFOL N/A 09/11/2017   Procedure: COLONOSCOPY WITH PROPOFOL;  Surgeon: Manya Silvas, MD;  Location: [facility];  Service: Endoscopy;  Laterality: N/A;   ESOPHAGOGASTRODUODENOSCOPY (EGD) WITH PROPOFOL N/A 02/02/2018   Procedure: ESOPHAGOGASTRODUODENOSCOPY (EGD) WITH PROPOFOL;  Surgeon: [name], MD;  Location: [facility] END[facility];  Service: Gastroenterology;  Laterality: N/A;   EYE SURGERY     glaucoma left , glaucoma right x  2 Dr. Dorna Bloom    HERNIA REPAIR     IMAGE GUIDED SINUS SURGERY Bilateral 02/23/2018   Procedure: IMAGE GUIDED SINUS SURGERY Right ethmoidectomy with frontal exploration bilteral maxillary antrostomies Right sphenoidectomy;  Surgeon: Clyde Canterbury, MD;  Location: [place];  Service: ENT;  Laterality: Bilateral;  NEED STRYKER DISK GAVE DISK TO CECE 9-9   UMBILICAL HERNIA REPAIR  2002   XI ROBOTIC ASSISTED PARAESOPHAGEAL HERNIA REPAIR N/A 08/30/2020   Procedure: XI ROBOTIC ASSISTED PARAESOPHAGEAL HERNIA REPAIR with Armida Sans, RNFA to assist;  Surgeon: Jules Husbands, MD;  Location: [facility];  Service: General;  Laterality: N/A;    Current Outpatient Medications  Medication Sig Dispense Refill   azelastine (ASTELIN) 0.1 % nasal spray Place 2 sprays into both nostrils 2 (two) times daily as needed for rhinitis. Use in each nostril as directed 30 mL 12   bimatoprost (LUMIGAN) 0.01 % SOLN Place 1 drop into both eyes at bedtime.      Continuous Blood Gluc Receiver (FREESTYLE LIBRE 2 READER) DEVI 1 Device by Does not apply route every 14 (fourteen) days. 2 each 11   Continuous Blood Gluc Sensor (FREESTYLE LIBRE 2 SENSOR) MISC 1 Device by Does not apply route every 14 (fourteen) days. 2 each 11   empagliflozin (JARDIANCE) 25 MG TABS tablet Take 1 tablet (25 mg total) by mouth daily. 90 tablet 3   ezetimibe (ZETIA) 10 MG tablet Take 1 tablet (10 mg total) by mouth daily. 90 tablet 3   glucose blood (ONE TOUCH ULTRA TEST) test strip USE ONE STRIP TO  CHECK GLUCOSE bid 200 each 3   hydrocortisone 2.5 % cream Apply BID to affected areas on face and penis as needed for flares of dryness, itch, and flaking. 30 g 3   insulin glargine (LANTUS SOLOSTAR) 100 UNIT/ML Solostar Pen Inject 28-30 Units into the skin at bedtime. avg units 28 27 mL 1   Insulin Pen Needle (PEN NEEDLES) 31G X 6 MM MISC 1 Device by Does not apply route 3 (three) times daily as needed. 300 each 3   Insulin Pen Needle (PEN  NEEDLES) 32G X 4 MM MISC 1 Units by Does not apply route daily. Use 1 pen needle daily with Lantus 100 each 3   ketoconazole (NIZORAL) 2 % cream Apply to affected areas on face and penis 1-2 times per day. May use daily. 30 g 3   metFORMIN (GLUCOPHAGE) 500 MG tablet Take 1-2 tablets (500-1,000 mg total) by mouth See admin instructions. Take 500 mg by mouth in am with food and 1000 mg in the evening with food 270 tablet 3   Multiple Vitamin (MULTIVITAMIN) capsule Take 1 capsule by mouth daily.     ONETOUCH DELICA LANCETS FINE MISC Use to check blood sugar once a day. Dx Code E11.9 100 each 3   Probiotic Product (PROBIOTIC DAILY PO) Take 1 tablet by mouth daily.     rosuvastatin (CRESTOR) 5 MG tablet Take 1 tablet (5 mg total) by mouth daily. 90 tablet 3   timolol (TIMOPTIC) 0.5 % ophthalmic solution Place 1 drop into both eyes daily.     VITAMIN D, CHOLECALCIFEROL, PO Take 1 tablet by mouth daily.     No current facility-administered medications for this visit.     Allergies:   Codeine, Pioglitazone, Sitagliptin phosphate, Atorvastatin, Crestor [rosuvastatin], Other, and Tape   Social History:  The patient  reports that he has quit smoking. He has never used smokeless tobacco. He reports that he does not drink alcohol and does not use drugs.   Family History:   family history includes Diabetes in his brother, brother, brother, mother, sister, sister, and sister; Heart disease in his brother and brother.    Review of Systems: Review of Systems  Constitutional: Negative.   HENT: Negative.    Respiratory: Negative.    Cardiovascular: Negative.   Gastrointestinal: Negative.   Musculoskeletal: Negative.   Neurological: Negative.   Psychiatric/Behavioral: Negative.    All other systems reviewed and are negative.   PHYSICAL EXAM: VS:  BP 126/80 (BP Location: Right Arm, Patient Position: Sitting, Cuff Size: Normal)   Pulse 62   Ht 5' 11.5" (1.816 m)   Wt 197 lb 4 oz (89.5 kg)   SpO2 98%    BMI 27.13 kg/m  , BMI Body mass index is 27.13 kg/m. GEN: Well nourished, well developed, in no acute distress HEENT: normal Neck: no JVD, carotid bruits, or masses Cardiac: RRR; no murmurs, rubs, or gallops,no edema  Respiratory:  clear to auscultation bilaterally, normal work of breathing GI: soft, nontender, nondistended, + BS MS: no deformity or atrophy Skin: warm and dry, no rash Neuro:  Strength and sensation are intact Psych: euthymic mood, full affect  Recent Labs: 04/05/2021: ALT 12; BUN 21; Creatinine, Ser 0.88; Hemoglobin 15.6; Platelets 232.0; Potassium 5.0; Sodium 138; TSH 1.88    Lipid Panel Lab Results  Component Value Date   CHOL 154 04/05/2021   HDL 34.40 (L) 04/05/2021   LDLCALC 101 (H) 04/05/2021   TRIG 90.0 04/05/2021      Wt  Readings from Last 3 Encounters:  04/05/21 197 lb 4 oz (89.5 kg)  12/12/20 190 lb (86.2 kg)  10/24/20 196 lb (88.9 kg)     ASSESSMENT AND PLAN:  Problem List Items Addressed This Visit       Cardiology Problems   HTN (hypertension)   Relevant Medications   ezetimibe (ZETIA) 10 MG tablet   rosuvastatin (CRESTOR) 5 MG tablet   Other Relevant Orders   EKG 12-Lead   Other Visit Diagnoses     Coronary artery calcification    -  Primary   Relevant Medications   ezetimibe (ZETIA) 10 MG tablet   rosuvastatin (CRESTOR) 5 MG tablet      Coronary calcification on CT scan Study done for risk stratification, score 500 Notable calcification in all 3 vessels Discussed treatment options, given no significant anginal symptoms, good exercise tolerance, would treat medically Statin intolerance to Lipitor 10 in the past Recommend restart Zetia 10 mg daily, Crestor 5 mg every other day as tolerated If unable to tolerate medications to acceptable numbers, goal LDL less than 70, will try PCSK9 inhibitor -Discussed anginal symptoms to watch for, for any anginal symptoms could consider cardiac CTA, pharmacologic Myoview versus  catheterization.  The above was discussed  Essential hypertension Blood pressure is well controlled on today's visit. No changes made to the medications.  Long discussion concerning coronary calcification finding, images reviewed,  Total encounter time more than 60 minutes  Greater than 50% was spent in counseling and coordination of care with the patient    Signed, Esmond Plants, M.D., Ph.D. [place], [place]

## 2021-04-06 LAB — URINALYSIS, ROUTINE W REFLEX MICROSCOPIC
Bilirubin, UA: NEGATIVE
Leukocytes,UA: NEGATIVE
Nitrite, UA: NEGATIVE
Protein,UA: NEGATIVE
RBC, UA: NEGATIVE
Specific Gravity, UA: >=1.03 — AB (ref 1.005–1.030)
Urobilinogen, Ur: 0.2 mg/dL (ref 0.2–1.0)
pH, UA: 5.5 (ref 5.0–7.5)

## 2021-04-06 LAB — MICROALBUMIN / CREATININE URINE RATIO
Creatinine, Urine: 78.9 mg/dL
Microalb/Creat Ratio: 14 mg/g creat (ref 0–29)
Microalbumin, Urine: 11.1 ug/mL

## 2021-04-10 ENCOUNTER — Encounter: Payer: Self-pay | Admitting: Internal Medicine

## 2021-04-10 ENCOUNTER — Other Ambulatory Visit: Payer: Self-pay | Admitting: Internal Medicine

## 2021-04-10 DIAGNOSIS — E119 Type 2 diabetes mellitus without complications: Secondary | ICD-10-CM

## 2021-04-10 DIAGNOSIS — E1159 Type 2 diabetes mellitus with other circulatory complications: Secondary | ICD-10-CM

## 2021-04-10 MED ORDER — FREESTYLE LIBRE 3 SENSOR MISC: 1.0000 | Freq: Three times a day (TID) | 11 refills | Status: DC

## 2021-04-23 DIAGNOSIS — Z8719 Personal history of other diseases of the digestive system: Secondary | ICD-10-CM | POA: Diagnosis not present

## 2021-04-23 DIAGNOSIS — K2100 Gastro-esophageal reflux disease with esophagitis, without bleeding: Secondary | ICD-10-CM | POA: Diagnosis not present

## 2021-04-23 DIAGNOSIS — K449 Diaphragmatic hernia without obstruction or gangrene: Secondary | ICD-10-CM | POA: Diagnosis not present

## 2021-04-23 DIAGNOSIS — Z9889 Other specified postprocedural states: Secondary | ICD-10-CM | POA: Diagnosis not present

## 2021-04-23 DIAGNOSIS — R143 Flatulence: Secondary | ICD-10-CM | POA: Diagnosis not present

## 2021-04-24 DIAGNOSIS — Z03818 Encounter for observation for suspected exposure to other biological agents ruled out: Secondary | ICD-10-CM | POA: Diagnosis not present

## 2021-04-24 DIAGNOSIS — Z20822 Contact with and (suspected) exposure to covid-19: Secondary | ICD-10-CM | POA: Diagnosis not present

## 2021-04-26 DIAGNOSIS — E119 Type 2 diabetes mellitus without complications: Secondary | ICD-10-CM | POA: Diagnosis not present

## 2021-04-26 DIAGNOSIS — Z885 Allergy status to narcotic agent status: Secondary | ICD-10-CM | POA: Diagnosis not present

## 2021-04-26 DIAGNOSIS — R059 Cough, unspecified: Secondary | ICD-10-CM | POA: Diagnosis not present

## 2021-04-26 DIAGNOSIS — I10 Essential (primary) hypertension: Secondary | ICD-10-CM | POA: Diagnosis not present

## 2021-04-26 DIAGNOSIS — J310 Chronic rhinitis: Secondary | ICD-10-CM | POA: Diagnosis not present

## 2021-04-26 DIAGNOSIS — E785 Hyperlipidemia, unspecified: Secondary | ICD-10-CM | POA: Diagnosis not present

## 2021-04-26 DIAGNOSIS — K2100 Gastro-esophageal reflux disease with esophagitis, without bleeding: Secondary | ICD-10-CM | POA: Diagnosis not present

## 2021-04-26 DIAGNOSIS — Z7984 Long term (current) use of oral hypoglycemic drugs: Secondary | ICD-10-CM | POA: Diagnosis not present

## 2021-04-26 DIAGNOSIS — Z79899 Other long term (current) drug therapy: Secondary | ICD-10-CM | POA: Diagnosis not present

## 2021-04-26 DIAGNOSIS — J329 Chronic sinusitis, unspecified: Secondary | ICD-10-CM | POA: Diagnosis not present

## 2021-04-26 DIAGNOSIS — K449 Diaphragmatic hernia without obstruction or gangrene: Secondary | ICD-10-CM | POA: Diagnosis not present

## 2021-04-26 DIAGNOSIS — J342 Deviated nasal septum: Secondary | ICD-10-CM | POA: Diagnosis not present

## 2021-04-26 DIAGNOSIS — G43909 Migraine, unspecified, not intractable, without status migrainosus: Secondary | ICD-10-CM | POA: Diagnosis not present

## 2021-04-26 DIAGNOSIS — J449 Chronic obstructive pulmonary disease, unspecified: Secondary | ICD-10-CM | POA: Diagnosis not present

## 2021-04-30 ENCOUNTER — Other Ambulatory Visit: Payer: Self-pay

## 2021-04-30 ENCOUNTER — Ambulatory Visit (INDEPENDENT_AMBULATORY_CARE_PROVIDER_SITE_OTHER): Payer: PPO | Admitting: Internal Medicine

## 2021-04-30 ENCOUNTER — Encounter: Payer: Self-pay | Admitting: Internal Medicine

## 2021-04-30 VITALS — BP 130/84 | HR 57 | Temp 97.1°F | Ht 71.5 in | Wt 194.8 lb

## 2021-04-30 DIAGNOSIS — Z9889 Other specified postprocedural states: Secondary | ICD-10-CM

## 2021-04-30 DIAGNOSIS — Z794 Long term (current) use of insulin: Secondary | ICD-10-CM

## 2021-04-30 DIAGNOSIS — E785 Hyperlipidemia, unspecified: Secondary | ICD-10-CM

## 2021-04-30 DIAGNOSIS — E119 Type 2 diabetes mellitus without complications: Secondary | ICD-10-CM

## 2021-04-30 DIAGNOSIS — I152 Hypertension secondary to endocrine disorders: Secondary | ICD-10-CM | POA: Diagnosis not present

## 2021-04-30 DIAGNOSIS — E1159 Type 2 diabetes mellitus with other circulatory complications: Secondary | ICD-10-CM | POA: Diagnosis not present

## 2021-04-30 MED ORDER — EMPAGLIFLOZIN 25 MG PO TABS: 25.0000 mg | ORAL_TABLET | Freq: Every day | ORAL | 3 refills | Status: DC

## 2021-04-30 NOTE — Progress Notes (Signed)
Chief Complaint  Patient presents with   Follow-up   F/u  1. Dm 2 controlled and htn controlled on jardiance 25 mg zetia 10, lantus 28-30 units qd, metformin 500, crestor 5  2. S/p nasal sinus surgery 04/26/21 3rd nose surgery 05/01/21 has f/u and currently has stents doing well except for pain 5/10 on doxycycline x 20 days    Review of Systems  Constitutional:  Negative for weight loss.  HENT:  Negative for hearing loss.        Nose pain  Eyes:  Negative for blurred vision.  Respiratory:  Negative for shortness of breath.   Cardiovascular:  Negative for chest pain.  Gastrointestinal:  Negative for abdominal pain and blood in stool.  Musculoskeletal:  Negative for back pain.  Skin:  Negative for rash.  Neurological:  Negative for headaches.  Psychiatric/Behavioral:  Negative for depression.   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 Excised: 02/25/2016   Wears hearing aid in both ears    Past Surgical History:  Procedure Laterality Date   CATARACT EXTRACTION, BILATERAL     COLONOSCOPY WITH PROPOFOL N/A 09/11/2017   Procedure: COLONOSCOPY WITH PROPOFOL;  Surgeon: Manya Silvas, MD;  Location: [facility] ENDOSCOPY;  Service: Endoscopy;  Laterality: N/A;   ESOPHAGOGASTRODUODENOSCOPY (EGD) WITH PROPOFOL N/A 02/02/2018   Procedure: ESOPHAGOGASTRODUODENOSCOPY (EGD) WITH PROPOFOL;  Surgeon: [name], MD;  Location: [facility] ENDOSCOPY;  Service: Gastroenterology;  Laterality: N/A;   EYE SURGERY     glaucoma left , glaucoma right x 2 Dr. Dorna Bloom    HERNIA REPAIR     IMAGE GUIDED SINUS SURGERY Bilateral 02/23/2018   Procedure: IMAGE GUIDED SINUS SURGERY Right ethmoidectomy with frontal exploration bilteral maxillary  antrostomies Right sphenoidectomy;  Surgeon: Clyde Canterbury, MD;  Location: [facility];  Service: ENT;  Laterality: Bilateral;  NEED STRYKER DISK GAVE DISK TO [facility] 9-9   NASAL FRACTURE SURGERY     younger age   NASAL SINUS SURGERY     09/62/83   UMBILICAL HERNIA REPAIR  2002   XI ROBOTIC ASSISTED PARAESOPHAGEAL HERNIA REPAIR N/A 08/30/2020   Procedure: XI ROBOTIC ASSISTED PARAESOPHAGEAL HERNIA REPAIR with Armida Sans, RNFA to assist;  Surgeon: Jules Husbands, MD;  Location: [facility] ORS;  Service: General;  Laterality: N/A;   Family History  Problem Relation Age of Onset   Diabetes Mother    Diabetes Sister    Heart disease Brother    Diabetes Brother    Heart disease Brother    Diabetes Brother    Diabetes Brother    Diabetes Sister    Diabetes Sister    Social History   Socioeconomic History   Marital status: Married    Spouse name: Not on file   Number of children: 2   Years of education: Not on file   Highest education level: Not on file  Occupational History   Occupation: semi retired Museum/gallery curator asst,  does Engineer, manufacturing: SELF EMPLOYED  Tobacco Use   Smoking status: Former   Smokeless tobacco: Never   Tobacco comments:    smoked some as teenager  Media planner  Vaping Use: Never used  Substance and Sexual Activity   Alcohol use: No    Alcohol/week: 0.0 standard drinks   Drug use: No   Sexual activity: Yes  Other Topics Concern   Not on file  Social History Narrative   No living will   No health care POA---okay with wife making decisions. Daughter Joelene Millin would be alternate   Would accept resuscitation   Not sure about tube feeds   Married    Social Determinants of Health   Financial Resource Strain: Medium Risk   Difficulty of Paying Living Expenses: Somewhat hard  Food Insecurity: No Food Insecurity   Worried About Charity fundraiser in the Last Year: Never true   Ran Out of Food in the Last Year: Never true   Transportation Needs: No Transportation Needs   Lack of Transportation (Medical): No   Lack of Transportation (Non-Medical): No  Physical Activity: Sufficiently Active   Days of Exercise per Week: 4 days   Minutes of Exercise per Session: 60 min  Stress: No Stress Concern Present   Feeling of Stress : Not at all  Social Connections: Unknown   Frequency of Communication with Friends and Family: Not on file   Frequency of Social Gatherings with Friends and Family: Not on file   Attends Religious Services: Not on file   Active Member of Clubs or Organizations: Not on file   Attends Archivist Meetings: Not on file   Marital Status: Married  Human resources officer Violence: Not At Risk   Fear of Current or Ex-Partner: No   Emotionally Abused: No   Physically Abused: No   Sexually Abused: No   Current Meds  Medication Sig   azelastine (ASTELIN) 0.1 % nasal spray Place 2 sprays into both nostrils 2 (two) times daily as needed for rhinitis. Use in each nostril as directed   bimatoprost (LUMIGAN) 0.01 % SOLN Place 1 drop into both eyes at bedtime.    Continuous Blood Gluc Receiver (FREESTYLE LIBRE 2 READER) DEVI 1 Device by Does not apply route every 14 (fourteen) days.   Continuous Blood Gluc Sensor (FREESTYLE LIBRE 3 SENSOR) MISC 1 Device by Does not apply route 3 (three) times daily. Place 1 sensor on the skin every 14 days. Use to check glucose continuously   doxycycline (VIBRA-TABS) 100 MG tablet Take 100 mg by mouth in the morning and at bedtime.   ezetimibe (ZETIA) 10 MG tablet Take 1 tablet (10 mg total) by mouth daily.   glucose blood (ONE TOUCH ULTRA TEST) test strip USE ONE STRIP TO CHECK GLUCOSE bid   hydrocortisone 2.5 % cream Apply BID to affected areas on face and penis as needed for flares of dryness, itch, and flaking.   insulin glargine (LANTUS SOLOSTAR) 100 UNIT/ML Solostar Pen Inject 28-30 Units into the skin at bedtime. avg units 28   Insulin Pen Needle (PEN  NEEDLES) 31G X 6 MM MISC 1 Device by Does not apply route 3 (three) times daily as needed.   Insulin Pen Needle (PEN NEEDLES) 32G X 4 MM MISC 1 Units by Does not apply route daily. Use 1 pen needle daily with Lantus   ketoconazole (NIZORAL) 2 % cream Apply to affected areas on face and penis 1-2 times per day. May use daily.   metFORMIN (GLUCOPHAGE) 500 MG tablet Take 1-2 tablets (500-1,000 mg total) by mouth See admin instructions. Take 500 mg by mouth in am with food and 1000 mg in the evening with  food   Multiple Vitamin (MULTIVITAMIN) capsule Take 1 capsule by mouth daily.   ONETOUCH DELICA LANCETS FINE MISC Use to check blood sugar once a day. Dx Code E11.9   Probiotic Product (PROBIOTIC DAILY PO) Take 1 tablet by mouth daily.   rosuvastatin (CRESTOR) 5 MG tablet Take 1 tablet (5 mg total) by mouth daily.   timolol (TIMOPTIC) 0.5 % ophthalmic solution Place 1 drop into both eyes daily.   VITAMIN D, CHOLECALCIFEROL, PO Take 1 tablet by mouth daily.   [DISCONTINUED] empagliflozin (JARDIANCE) 25 MG TABS tablet Take 1 tablet (25 mg total) by mouth daily.   Allergies  Allergen Reactions   Codeine    Pioglitazone     REACTION: achy   Sitagliptin Phosphate     REACTION: muscle aches   Atorvastatin Other (See Comments)    myalgia   Crestor [Rosuvastatin]     Muscle aching   Other Rash   Tape Rash   Recent Results (from the past 2160 hour(s))  PSA     Status: None   Collection Time: 04/05/21  7:33 AM  Result Value Ref Range   PSA 0.60 0.10 - 4.00 ng/mL    Comment: Test performed using Access Hybritech PSA Assay, a parmagnetic partical, chemiluminecent immunoassay.  Microalbumin / creatinine urine ratio     Status: None   Collection Time: 04/05/21  7:33 AM  Result Value Ref Range   Creatinine, Urine 78.9 Not Estab. mg/dL   Microalbumin, Urine 11.1 Not Estab. ug/mL   Microalb/Creat Ratio 14 0 - 29 mg/g creat    Comment:                        Normal:                0 -  29                         Moderately increased: 30 - 300                        Severely increased:       >300   Urinalysis, Routine w reflex microscopic     Status: Abnormal   Collection Time: 04/05/21  7:33 AM  Result Value Ref Range   Specific Gravity, UA      >=1.030 (A) 1.005 - 1.030   pH, UA 5.5 5.0 - 7.5   Color, UA Yellow Yellow   Appearance Ur Clear Clear   Leukocytes,UA Negative Negative   Protein,UA Negative Negative/Trace   Glucose, UA 3+ (A) Negative   Ketones, UA 1+ (A) Negative   RBC, UA Negative Negative   Bilirubin, UA Negative Negative   Urobilinogen, Ur 0.2 0.2 - 1.0 mg/dL   Nitrite, UA Negative Negative   Microscopic Examination Comment     Comment: Microscopic not indicated and not performed.  TSH     Status: None   Collection Time: 04/05/21  7:33 AM  Result Value Ref Range   TSH 1.88 0.35 - 5.50 uIU/mL  Hemoglobin A1c     Status: None   Collection Time: 04/05/21  7:33 AM  Result Value Ref Range   Hgb A1c MFr Bld 6.0 4.6 - 6.5 %    Comment: Glycemic Control Guidelines for People with Diabetes:Non Diabetic:  <6%Goal of Therapy: <7%Additional Action Suggested:  >8%   CBC w/Diff     Status: None   Collection  Time: 04/05/21  7:33 AM  Result Value Ref Range   WBC 5.9 4.0 - 10.5 K/uL   RBC 4.79 4.22 - 5.81 Mil/uL   Hemoglobin 15.6 13.0 - 17.0 g/dL   HCT 08.1 68.5 - 44.6 %   MCV 96.1 78.0 - 100.0 fl   MCHC 33.9 30.0 - 36.0 g/dL   RDW 20.4 94.3 - 77.6 %   Platelets 232.0 150.0 - 400.0 K/uL   Neutrophils Relative % 58.5 43.0 - 77.0 %   Lymphocytes Relative 28.7 12.0 - 46.0 %   Monocytes Relative 10.8 3.0 - 12.0 %   Eosinophils Relative 1.2 0.0 - 5.0 %   Basophils Relative 0.8 0.0 - 3.0 %   Neutro Abs 3.4 1.4 - 7.7 K/uL   Lymphs Abs 1.7 0.7 - 4.0 K/uL   Monocytes Absolute 0.6 0.1 - 1.0 K/uL   Eosinophils Absolute 0.1 0.0 - 0.7 K/uL   Basophils Absolute 0.0 0.0 - 0.1 K/uL  Lipid panel     Status: Abnormal   Collection Time: 04/05/21  7:33 AM  Result Value Ref Range    Cholesterol 154 0 - 200 mg/dL    Comment: ATP III Classification       Desirable:  < 200 mg/dL               Borderline High:  200 - 239 mg/dL          High:  > = 548 mg/dL   Triglycerides 06.3 0.0 - 149.0 mg/dL    Comment: Normal:  <667 mg/dLBorderline High:  150 - 199 mg/dL   HDL 04.42 (L) >48.31 mg/dL   VLDL 26.6 0.0 - 85.8 mg/dL   LDL Cholesterol 268 (H) 0 - 99 mg/dL   Total CHOL/HDL Ratio 4     Comment:                Men          Women1/2 Average Risk     3.4          3.3Average Risk          5.0          4.42X Average Risk          9.6          7.13X Average Risk          15.0          11.0                       NonHDL 119.38     Comment: NOTE:  Non-HDL goal should be 30 mg/dL higher than patient's LDL goal (i.e. LDL goal of < 70 mg/dL, would have non-HDL goal of < 100 mg/dL)  Comprehensive metabolic panel     Status: Abnormal   Collection Time: 04/05/21  7:33 AM  Result Value Ref Range   Sodium 138 135 - 145 mEq/L   Potassium 5.0 3.5 - 5.1 mEq/L   Chloride 101 96 - 112 mEq/L   CO2 28 19 - 32 mEq/L   Glucose, Bld 110 (H) 70 - 99 mg/dL   BUN 21 6 - 23 mg/dL   Creatinine, Ser 5.88 0.40 - 1.50 mg/dL   Total Bilirubin 0.7 0.2 - 1.2 mg/dL   Alkaline Phosphatase 60 39 - 117 U/L   AST 15 0 - 37 U/L   ALT 12 0 - 53 U/L   Total Protein 7.0 6.0 - 8.3 g/dL  Albumin 4.6 3.5 - 5.2 g/dL   GFR 49.20 >60.07 mL/min    Comment: Calculated using the CKD-EPI Creatinine Equation (2021)   Calcium 9.3 8.4 - 10.5 mg/dL   Objective  Body mass index is 26.79 kg/m. Wt Readings from Last 3 Encounters:  04/30/21 194 lb 12.8 oz (88.4 kg)  04/05/21 197 lb 4 oz (89.5 kg)  12/12/20 190 lb (86.2 kg)   Temp Readings from Last 3 Encounters:  04/30/21 (!) 97.1 F (36.2 C) (Temporal)  10/24/20 97.8 F (36.6 C) (Oral)  09/13/20 98.3 F (36.8 C) (Oral)   BP Readings from Last 3 Encounters:  04/30/21 130/84  04/05/21 126/80  10/24/20 108/74   Pulse Readings from Last 3 Encounters:  04/30/21 (!)  57  04/05/21 62  10/24/20 (!) 58    Physical Exam Vitals and nursing note reviewed.  Constitutional:      Appearance: Normal appearance. He is well-developed and well-groomed. He is obese.  HENT:     Head: Normocephalic and atraumatic.  Eyes:     Conjunctiva/sclera: Conjunctivae normal.     Pupils: Pupils are equal, round, and reactive to light.  Cardiovascular:     Rate and Rhythm: Normal rate and regular rhythm.     Heart sounds: Normal heart sounds.  Pulmonary:     Effort: Pulmonary effort is normal. No respiratory distress.     Breath sounds: Normal breath sounds.  Abdominal:     Tenderness: There is no abdominal tenderness.  Musculoskeletal:     Lumbar back: Tenderness present. Negative right straight leg raise test and negative left straight leg raise test.  Skin:    General: Skin is warm and moist.  Neurological:     General: No focal deficit present.     Mental Status: He is alert and oriented to person, place, and time. Mental status is at baseline.     Sensory: Sensation is intact.     Motor: Motor function is intact.     Coordination: Coordination is intact.     Gait: Gait is intact. Gait normal.  Psychiatric:        Attention and Perception: Attention and perception normal.        Mood and Affect: Mood and affect normal.        Speech: Speech normal.        Behavior: Behavior normal. Behavior is cooperative.        Thought Content: Thought content normal.        Cognition and Memory: Cognition and memory normal.        Judgment: Judgment normal.    Assessment  Plan  Hypertension associated with diabetes with hld ontrolled- Plan: empagliflozin (JARDIANCE) 25 MG TABS tablet, Comprehensive metabolic panel, Lipid panel, CBC with Differential/Platelet, Hemoglobin A1c ardiance 25 mg zetia 10, lantus 28-30 units qd, metformin 500, crestor 5  Foot exam today normal  Fasting labs mid 08/2021   H/O sinus surgery x 3 doing well  Appt 05/01/21 f/u    HM Flu shot  utd prevnar utd, pna 23 had 10/24/15 utd Tdap utd,  shingrix per pt had 2/2 at [facility] pharmacy  MMR immune  [employer] 5/5   HCV neg 10/24/15  Colonoscopy had 09/11/17 for multiple polyps [facility] tubular f/u in 3 years EGD/Colonoscopy 07/31/20 [facility]    Dermatology yearly Dr. Roseanne Reno h/o Aks 12/13/19; due to see w/in 6 months next week appt  appt 02/12/21    PSA nl 0.45 03/09/20 do DRE in future  rec healthy diet and exercise    AE Dr. Dorna Bloom 08/10/19 no retinopathy eye surface disease glaucoma surgery 2019 to reduce pressure left eye and pressure 14 as of 10/24/20 and had surgery right eye x 2 but pressure still 22 and appt 10/26/20 to f/u       Provider: Dr. Olivia Mackie McLean-Scocuzza-Internal Medicine

## 2021-05-01 DIAGNOSIS — J310 Chronic rhinitis: Secondary | ICD-10-CM | POA: Diagnosis not present

## 2021-05-15 DIAGNOSIS — J324 Chronic pansinusitis: Secondary | ICD-10-CM | POA: Diagnosis not present

## 2021-05-15 DIAGNOSIS — J310 Chronic rhinitis: Secondary | ICD-10-CM | POA: Diagnosis not present

## 2021-06-26 DIAGNOSIS — J324 Chronic pansinusitis: Secondary | ICD-10-CM | POA: Diagnosis not present

## 2021-07-04 ENCOUNTER — Other Ambulatory Visit: Payer: Self-pay

## 2021-07-04 ENCOUNTER — Telehealth: Payer: Self-pay | Admitting: Internal Medicine

## 2021-07-04 DIAGNOSIS — E119 Type 2 diabetes mellitus without complications: Secondary | ICD-10-CM

## 2021-07-04 DIAGNOSIS — I152 Hypertension secondary to endocrine disorders: Secondary | ICD-10-CM

## 2021-07-04 DIAGNOSIS — Z794 Long term (current) use of insulin: Secondary | ICD-10-CM

## 2021-07-04 DIAGNOSIS — E1159 Type 2 diabetes mellitus with other circulatory complications: Secondary | ICD-10-CM

## 2021-07-04 MED ORDER — EMPAGLIFLOZIN 25 MG PO TABS: 25.0000 mg | ORAL_TABLET | Freq: Every day | ORAL | 3 refills | Status: DC

## 2021-07-04 NOTE — Telephone Encounter (Signed)
Given Rx Jardiance to get from [place] pharmacy pt understands medication formulations may not be the same voiced understanding   Dr. Olivia Mackie McLean-Scocuzza

## 2021-07-04 NOTE — Telephone Encounter (Signed)
Pts wife called in regards to medication refill. Pts wife would like their medication to be sent to [facility].    empagliflozin (JARDIANCE) 25 MG TABS tablet  [facility]: [phone] Fax: [phone]

## 2021-07-04 NOTE — Telephone Encounter (Signed)
Called and informed the Patient that this pharmacy [place]. Due to that we can not send in medications to them.   Patient verbalized understanding he would like a signed and printed RX placed upfront for him. Medication ordered for print and put on your desk to sign.   Patient informed that this will be completed and placed upfront. He will come to pick this up tomorrow.

## 2021-07-13 ENCOUNTER — Other Ambulatory Visit: Payer: Self-pay | Admitting: Internal Medicine

## 2021-07-13 DIAGNOSIS — E119 Type 2 diabetes mellitus without complications: Secondary | ICD-10-CM

## 2021-07-19 DIAGNOSIS — H401133 Primary open-angle glaucoma, bilateral, severe stage: Secondary | ICD-10-CM | POA: Diagnosis not present

## 2021-08-21 DIAGNOSIS — J324 Chronic pansinusitis: Secondary | ICD-10-CM | POA: Diagnosis not present

## 2021-08-22 ENCOUNTER — Ambulatory Visit: Payer: Self-pay | Admitting: Pharmacist

## 2021-08-22 NOTE — Chronic Care Management (AMB) (Signed)
?  Chronic Care Management  ? ?Note ? ?08/22/2021 ?Name: Alexis Sharp MRN: [id number] DOB: Jul 03, 1945 ? ? ? ?Closing pharmacy CCM case at this time. Patient has clinic contact information for future questions or concerns.  ? ?Catie Darnelle Maffucci, PharmD, [place], CPP ?Clinical Pharmacist ?Therapist, music at [employer] ?[phone] ? ?

## 2021-08-27 ENCOUNTER — Other Ambulatory Visit: Payer: Self-pay

## 2021-08-27 ENCOUNTER — Ambulatory Visit: Payer: PPO | Admitting: Dermatology

## 2021-08-27 DIAGNOSIS — L738 Other specified follicular disorders: Secondary | ICD-10-CM | POA: Diagnosis not present

## 2021-08-27 DIAGNOSIS — D239 Other benign neoplasm of skin, unspecified: Secondary | ICD-10-CM

## 2021-08-27 DIAGNOSIS — Z86018 Personal history of other benign neoplasm: Secondary | ICD-10-CM | POA: Diagnosis not present

## 2021-08-27 DIAGNOSIS — D235 Other benign neoplasm of skin of trunk: Secondary | ICD-10-CM | POA: Diagnosis not present

## 2021-08-27 DIAGNOSIS — L821 Other seborrheic keratosis: Secondary | ICD-10-CM | POA: Diagnosis not present

## 2021-08-27 DIAGNOSIS — L570 Actinic keratosis: Secondary | ICD-10-CM | POA: Diagnosis not present

## 2021-08-27 DIAGNOSIS — D692 Other nonthrombocytopenic purpura: Secondary | ICD-10-CM | POA: Diagnosis not present

## 2021-08-27 DIAGNOSIS — L578 Other skin changes due to chronic exposure to nonionizing radiation: Secondary | ICD-10-CM | POA: Diagnosis not present

## 2021-08-27 DIAGNOSIS — L814 Other melanin hyperpigmentation: Secondary | ICD-10-CM | POA: Diagnosis not present

## 2021-08-27 DIAGNOSIS — Z872 Personal history of diseases of the skin and subcutaneous tissue: Secondary | ICD-10-CM | POA: Diagnosis not present

## 2021-08-27 DIAGNOSIS — L853 Xerosis cutis: Secondary | ICD-10-CM | POA: Diagnosis not present

## 2021-08-27 NOTE — Progress Notes (Signed)
? ?Follow-Up Visit ?  ?Subjective  ?Alexis Sharp is a 76 y.o. male who presents for the following: Follow-up (Patient here for AK follow up, upper body skin exam and skin cancer screening. Patient does have a spot at right ear that he noticed a few days ago but it is not sore, does not bleed. Has been treated with LN2 in June 2021. ). He has picked at it. ? ? ? ?The following portions of the chart were reviewed this encounter and updated as appropriate:  ?  ?  ? ?Review of Systems:  No other skin or systemic complaints except as noted in HPI or Assessment and Plan. ? ?Objective  ?Well appearing patient in no apparent distress; mood and affect are within normal limits. ? ?All skin waist up examined. ? ?mid sternum/chest ?Clear today  ? ? ?Right antihelix x 2, left ear helix x 1, left mandible x 1 (4) ?Erythematous thin papules/macules with gritty scale.  ? ? ? ?Assessment & Plan  ?Syringoma ?mid sternum/chest ? ?History of Atypical Syringoma excised 2017. Clear. Observe for recurrence. Call clinic for new or changing lesions.  Recommend regular skin exams, daily broad-spectrum spf 30+ sunscreen use, and photoprotection.   ? ? ?AK (actinic keratosis) (4) ?Right antihelix x 2, left ear helix x 1, left mandible x 1 ? ?Previously treated with LN2 11/2019 with recurrence at right antihelix. Recheck on f/up ? ?Destruction of lesion - Right antihelix x 2, left ear helix x 1, left mandible x 1 ? ?Destruction method: cryotherapy   ?Informed consent: discussed and consent obtained   ?Lesion destroyed using liquid nitrogen: Yes   ?Region frozen until ice ball extended beyond lesion: Yes   ?Outcome: patient tolerated procedure well with no complications   ?Post-procedure details: wound care instructions given   ?Additional details:  Prior to procedure, discussed risks of blister formation, small wound, skin dyspigmentation, or rare scar following cryotherapy. Recommend Vaseline ointment to treated areas while healing.   ? ?Seborrheic Keratoses ?- Stuck-on, waxy, tan-brown papules and/or plaques  ?- Benign-appearing ?- Discussed benign etiology and prognosis. ?- Observe ?- Call for any changes ? ?Lentigines ?- Scattered tan macules ?- Due to sun exposure ?- Benign-appering, observe ?- Recommend daily broad spectrum sunscreen SPF 30+ to sun-exposed areas, reapply every 2 hours as needed. ?- Call for any changes ? ?Purpura - Chronic; persistent and recurrent.  Treatable, but not curable. ?- Violaceous macules and patches ?- Benign ?- Related to trauma, age, sun damage and/or use of blood thinners, chronic use of topical and/or oral steroids ?- Observe ?- Can use OTC arnica containing moisturizer such as Dermend Bruise Formula if desired ?- Call for worsening or other concerns ? ?Actinic Damage ?- chronic, secondary to cumulative UV radiation exposure/sun exposure over time ?- diffuse scaly erythematous macules with underlying dyspigmentation ?- Recommend daily broad spectrum sunscreen SPF 30+ to sun-exposed areas, reapply every 2 hours as needed.  ?- Recommend staying in the shade or wearing long sleeves, sun glasses (UVA+UVB protection) and wide brim hats (4-inch brim around the entire circumference of the hat). ?- Call for new or changing lesions. ? ?Sebaceous Hyperplasia ?- Small yellow papules with a central dell ?- Benign ?- Observe ? ?Xerosis ?- diffuse xerotic patches ?- recommend gentle, hydrating skin care ?- gentle skin care handout given ? ?History of PreCancerous Actinic Keratosis  ?- site(s) of PreCancerous Actinic Keratosis clear today. ?- these may recur and new lesions may form requiring treatment to prevent transformation  into skin cancer ?- observe for new or changing spots and contact [facility] for appointment if occur ?- photoprotection with sun protective clothing; sunglasses and broad spectrum sunscreen with SPF of at least 30 + and frequent self skin exams recommended ?- yearly exams by a  dermatologist recommended for persons with history of PreCancerous Actinic Keratoses ? ?Return in about 6 months (around 02/27/2022) for AK follow up. ? ?Graciella Belton, RMA, am acting as scribe for Brendolyn Patty, MD . ? ?Documentation: I have reviewed the above documentation for accuracy and completeness, and I agree with the above. ? ?Brendolyn Patty MD  ?

## 2021-08-27 NOTE — Patient Instructions (Addendum)
Cryotherapy Aftercare ? ?Wash gently with soap and water everyday.   ?Apply Vaseline and Band-Aid daily until healed.  ? ?Seborrheic Dermatitis ? ?Mix hydrocortisone with ketaconazole 2% twice a day. If improved, decrease to hydrocortisone and ketaconazole mixed once a day. If still clear, decrease to ketaconazole only. ? ?Melanoma ABCDEs ? ?Melanoma is the most dangerous type of skin cancer, and is the leading cause of death from skin disease.  You are more likely to develop melanoma if you: ?Have light-colored skin, light-colored eyes, or red or blond hair ?Spend a lot of time in the sun ?Tan regularly, either outdoors or in a tanning bed ?Have had blistering sunburns, especially during childhood ?Have a close family member who has had a melanoma ?Have atypical moles or large birthmarks ? ?Early detection of melanoma is key since treatment is typically straightforward and cure rates are extremely high if we catch it early.  ? ?The first sign of melanoma is often a change in a mole or a new dark spot.  The ABCDE system is a way of remembering the signs of melanoma. ? ?A for asymmetry:  The two halves do not match. ?B for border:  The edges of the growth are irregular. ?C for color:  A mixture of colors are present instead of an even brown color. ?D for diameter:  Melanomas are usually (but not always) greater than 50m - the size of a pencil eraser. ?E for evolution:  The spot keeps changing in size, shape, and color. ? ?Please check your skin once per month between visits. You can use a small mirror in front and a large mirror behind you to keep an eye on the back side or your body.  ? ?If you see any new or changing lesions before your next follow-up, please call to schedule a visit. ? ?Please continue daily skin protection including broad spectrum sunscreen SPF 30+ to sun-exposed areas, reapplying every 2 hours as needed when you're outdoors.   ? ?If You Need Anything After Your Visit ? ?If you have any questions  or concerns for your doctor, please call our main line at [phone] press option 4 to reach your doctor's medical assistant. If no one answers, please leave a voicemail as directed and we will return your call as soon as possible. Messages left after 4 pm will be answered the following business day.  ? ?You may also send [place] message via MyChart. We typically respond to MyChart messages within 1-2 business days. ? ?For prescription refills, please ask your pharmacy to contact our office. Our fax number is [phone] ? ?If you have an urgent issue when the clinic is closed that cannot wait until the next business day, you can page your doctor at the number below.   ? ?Please note that while we do our best to be available for urgent issues outside of office hours, we are not available 24/7.  ? ?If you have an urgent issue and are unable to reach [place] you may choose to seek medical care at your doctor's office, retail clinic, urgent care center, or emergency room. ? ?If you have a medical emergency, please immediately call 911 or go to the emergency department. ? ?Pager Numbers ? ?- Dr. KNehemiah Massed [phone]? ?- Dr. MLaurence Ferrari [phone]? ?- Dr. SNicole Kindred [phone]? ?In the event of inclement weather, please call our main line at [phone] an update on the status of any delays or closures. ? ?Dermatology Medication Tips: ?Please  keep the boxes that topical medications come in in order to help keep track of the instructions about where and how to use these. Pharmacies typically print the medication instructions only on the boxes and not directly on the medication tubes.  ? ?If your medication is too expensive, please contact our office at [phone] option 4 or send [place] a message through [place].  ? ?We are unable to tell what your co-pay for medications will be in advance as this is different depending on your insurance coverage. However, we may be able to find a substitute medication at lower cost  or fill out paperwork to get insurance to cover a needed medication.  ? ?If a prior authorization is required to get your medication covered by your insurance company, please allow [place] 1-2 business days to complete this process. ? ?Drug prices often vary depending on where the prescription is filled and some pharmacies may offer cheaper prices. ? ?The website www.goodrx.com contains coupons for medications through different pharmacies. The prices here do not account for what the cost may be with help from insurance (it may be cheaper with your insurance), but the website can give you the price if you did not use any insurance.  ?- You can print the associated coupon and take it with your prescription to the pharmacy.  ?- You may also stop by our office during regular business hours and pick up a GoodRx coupon card.  ?- If you need your prescription sent electronically to a different pharmacy, notify our office through [facility] or by phone at [phone] option 4. ? ? ? ? ?Si Usted Necesita Algo Despu?s de Su Visita ? ?Tambi?n puede enviarnos un mensaje a trav?s de MyChart. Por lo general respondemos a los mensajes de MyChart en el transcurso de 1 a 2 d?as h?biles. ? ?Para renovar recetas, por favor pida a su farmacia que se ponga en contacto con nuestra oficina. Nuestro n?mero de fax es el [phone]. ? ?Si tiene un asunto urgente cuando la cl?nica est? cerrada y que no puede esperar hasta el siguiente d?a h?bil, puede llamar/localizar a su doctor(a) al n?mero que aparece a continuaci?n.  ? ?Por favor, tenga en cuenta que aunque hacemos todo lo posible para estar disponibles para asuntos urgentes fuera del horario de oficina, no estamos disponibles las 24 horas del d?a, los 7 d?as de la semana.  ? ?Si tiene un problema urgente y no puede comunicarse con nosotros, puede optar por buscar atenci?n m?dica  en el consultorio de su doctor(a), en una cl?nica privada, en un centro de atenci?n urgente o en una  sala de emergencias. ? ?Si tiene Engineer, maintenance (IT) m?dica, por favor llame inmediatamente al 911 o vaya a la sala de emergencias. ? ?N?meros de b?per ? ?- Dr. Nehemiah Massed: [phone] ? ?- Dra. Moye: [phone] ? ?- Dra. Nicole Kindred: [phone] ? ?En caso de inclemencias del tiempo, por favor llame a nuestra l?nea principal al [phone] para una actualizaci?n sobre el estado de cualquier retraso o cierre. ? ?Consejos para la medicaci?n en dermatolog?a: ?Por favor, guarde las cajas en las que vienen los medicamentos de uso t?pico para ayudarle a seguir las instrucciones sobre d?nde y c?mo usarlos. Las farmacias generalmente imprimen las instrucciones del medicamento s?lo en las cajas y no directamente en los tubos del [place].  ? ?Si su medicamento es muy caro, por favor, p?ngase en contacto con Zigmund Daniel llamando al [phone] y presione la opci?n 4 o env?enos un mensaje a trav?s de  MyChart.  ? ?No podemos decirle cu?l ser? su copago por los medicamentos por adelantado ya que esto es diferente dependiendo de la cobertura de su seguro. Sin embargo, es posible que podamos encontrar un medicamento sustituto a Electrical engineer un formulario para que el seguro cubra el medicamento que se considera necesario.  ? ?Si se requiere Ardelia Mems autorizaci?n previa para que su compa??a de seguros [place] su medicamento, por favor perm?tanos de 1 a 2 d?as h?biles para completar este proceso. ? ?Los precios de los medicamentos var?an con frecuencia dependiendo del Environmental consultant de d?nde se surte la receta y alguna farmacias pueden ofrecer precios m?s baratos. ? ?El sitio web www.goodrx.com tiene cupones para medicamentos de Airline pilot. Los precios aqu? no tienen en cuenta lo que podr?a costar con la ayuda del seguro (puede ser m?s barato con su seguro), pero el sitio web puede darle el precio si no utiliz? ning?n seguro.  ?- Puede imprimir el cup?n correspondiente y llevarlo con su receta a la farmacia.  ?- Tambi?n puede  pasar por nuestra oficina durante el horario de atenci?n regular y recoger una tarjeta de cupones de GoodRx.  ?- Si necesita que su receta se env?e electr?nicamente a [employer], informe a n

## 2021-08-28 ENCOUNTER — Other Ambulatory Visit: Payer: PPO

## 2021-09-06 ENCOUNTER — Encounter: Payer: Self-pay | Admitting: Cardiovascular Disease

## 2021-09-06 ENCOUNTER — Other Ambulatory Visit: Payer: Self-pay | Admitting: Internal Medicine

## 2021-09-06 DIAGNOSIS — E119 Type 2 diabetes mellitus without complications: Secondary | ICD-10-CM

## 2021-09-13 DIAGNOSIS — Z8719 Personal history of other diseases of the digestive system: Secondary | ICD-10-CM | POA: Diagnosis not present

## 2021-09-13 DIAGNOSIS — R143 Flatulence: Secondary | ICD-10-CM | POA: Diagnosis not present

## 2021-09-13 DIAGNOSIS — K2100 Gastro-esophageal reflux disease with esophagitis, without bleeding: Secondary | ICD-10-CM | POA: Diagnosis not present

## 2021-09-13 DIAGNOSIS — K6389 Other specified diseases of intestine: Secondary | ICD-10-CM | POA: Diagnosis not present

## 2021-09-13 DIAGNOSIS — R140 Abdominal distension (gaseous): Secondary | ICD-10-CM | POA: Diagnosis not present

## 2021-09-13 DIAGNOSIS — Z86010 Personal history of colonic polyps: Secondary | ICD-10-CM | POA: Diagnosis not present

## 2021-09-13 DIAGNOSIS — Z9889 Other specified postprocedural states: Secondary | ICD-10-CM | POA: Diagnosis not present

## 2021-09-16 ENCOUNTER — Other Ambulatory Visit (INDEPENDENT_AMBULATORY_CARE_PROVIDER_SITE_OTHER): Payer: PPO

## 2021-09-16 DIAGNOSIS — E1159 Type 2 diabetes mellitus with other circulatory complications: Secondary | ICD-10-CM

## 2021-09-16 DIAGNOSIS — I152 Hypertension secondary to endocrine disorders: Secondary | ICD-10-CM | POA: Diagnosis not present

## 2021-09-16 LAB — COMPREHENSIVE METABOLIC PANEL
ALT: 16 U/L (ref 0–53)
AST: 18 U/L (ref 0–37)
Albumin: 4.4 g/dL (ref 3.5–5.2)
Alkaline Phosphatase: 59 U/L (ref 39–117)
BUN: 16 mg/dL (ref 6–23)
CO2: 29 mEq/L (ref 19–32)
Calcium: 9.6 mg/dL (ref 8.4–10.5)
Chloride: 103 mEq/L (ref 96–112)
Creatinine, Ser: 0.86 mg/dL (ref 0.40–1.50)
GFR: 85.64 mL/min (ref >60.00)
Glucose, Bld: 129 mg/dL — ABNORMAL HIGH (ref 70–99)
Potassium: 4.6 mEq/L (ref 3.5–5.1)
Sodium: 140 mEq/L (ref 135–145)
Total Bilirubin: 0.5 mg/dL (ref 0.2–1.2)
Total Protein: 6.8 g/dL (ref 6.0–8.3)

## 2021-09-16 LAB — CBC WITH DIFFERENTIAL/PLATELET
Basophils Absolute: 0 10*3/uL (ref 0.0–0.1)
Basophils Relative: 0.6 % (ref 0.0–3.0)
Eosinophils Absolute: 0.1 10*3/uL (ref 0.0–0.7)
Eosinophils Relative: 1.3 % (ref 0.0–5.0)
HCT: 45.4 % (ref 39.0–52.0)
Hemoglobin: 15.2 g/dL (ref 13.0–17.0)
Lymphocytes Relative: 24.3 % (ref 12.0–46.0)
Lymphs Abs: 1.4 10*3/uL (ref 0.7–4.0)
MCHC: 33.4 g/dL (ref 30.0–36.0)
MCV: 97.9 fl (ref 78.0–100.0)
Monocytes Absolute: 0.8 10*3/uL (ref 0.1–1.0)
Monocytes Relative: 14.1 % — ABNORMAL HIGH (ref 3.0–12.0)
Neutro Abs: 3.5 10*3/uL (ref 1.4–7.7)
Neutrophils Relative %: 59.7 % (ref 43.0–77.0)
Platelets: 207 10*3/uL (ref 150.0–400.0)
RBC: 4.64 Mil/uL (ref 4.22–5.81)
RDW: 13.6 % (ref 11.5–15.5)
WBC: 5.9 10*3/uL (ref 4.0–10.5)

## 2021-09-16 LAB — HEMOGLOBIN A1C: Hgb A1c MFr Bld: 6.5 % (ref 4.6–6.5)

## 2021-09-16 LAB — LIPID PANEL
Cholesterol: 72 mg/dL (ref 0–200)
HDL: 36 mg/dL — ABNORMAL LOW (ref >39.00)
LDL Cholesterol: 25 mg/dL (ref 0–99)
NonHDL: 35.91
Total CHOL/HDL Ratio: 2
Triglycerides: 57 mg/dL (ref 0.0–149.0)
VLDL: 11.4 mg/dL (ref 0.0–40.0)

## 2021-09-27 DIAGNOSIS — H43812 Vitreous degeneration, left eye: Secondary | ICD-10-CM | POA: Diagnosis not present

## 2021-09-27 DIAGNOSIS — D3130 Benign neoplasm of unspecified choroid: Secondary | ICD-10-CM | POA: Diagnosis not present

## 2021-09-27 DIAGNOSIS — H35371 Puckering of macula, right eye: Secondary | ICD-10-CM | POA: Diagnosis not present

## 2021-09-27 DIAGNOSIS — H35361 Drusen (degenerative) of macula, right eye: Secondary | ICD-10-CM | POA: Diagnosis not present

## 2021-10-06 ENCOUNTER — Other Ambulatory Visit: Payer: Self-pay | Admitting: Internal Medicine

## 2021-10-06 DIAGNOSIS — E119 Type 2 diabetes mellitus without complications: Secondary | ICD-10-CM

## 2021-10-26 DIAGNOSIS — S299XXA Unspecified injury of thorax, initial encounter: Secondary | ICD-10-CM | POA: Diagnosis not present

## 2021-10-29 ENCOUNTER — Ambulatory Visit (INDEPENDENT_AMBULATORY_CARE_PROVIDER_SITE_OTHER): Payer: PPO | Admitting: Internal Medicine

## 2021-10-29 ENCOUNTER — Encounter: Payer: Self-pay | Admitting: Internal Medicine

## 2021-10-29 VITALS — BP 128/84 | HR 53 | Temp 97.4°F | Resp 14 | Ht 71.5 in | Wt 190.0 lb

## 2021-10-29 DIAGNOSIS — R5383 Other fatigue: Secondary | ICD-10-CM | POA: Diagnosis not present

## 2021-10-29 DIAGNOSIS — Z1329 Encounter for screening for other suspected endocrine disorder: Secondary | ICD-10-CM | POA: Diagnosis not present

## 2021-10-29 DIAGNOSIS — Z125 Encounter for screening for malignant neoplasm of prostate: Secondary | ICD-10-CM

## 2021-10-29 DIAGNOSIS — J9801 Acute bronchospasm: Secondary | ICD-10-CM | POA: Diagnosis not present

## 2021-10-29 DIAGNOSIS — E559 Vitamin D deficiency, unspecified: Secondary | ICD-10-CM

## 2021-10-29 DIAGNOSIS — S2232XA Fracture of one rib, left side, initial encounter for closed fracture: Secondary | ICD-10-CM

## 2021-10-29 DIAGNOSIS — J0111 Acute recurrent frontal sinusitis: Secondary | ICD-10-CM

## 2021-10-29 DIAGNOSIS — Z794 Long term (current) use of insulin: Secondary | ICD-10-CM

## 2021-10-29 DIAGNOSIS — E119 Type 2 diabetes mellitus without complications: Secondary | ICD-10-CM | POA: Diagnosis not present

## 2021-10-29 MED ORDER — FREESTYLE LIBRE 14 DAY SENSOR MISC: 1.0000 | 11 refills | Status: DC

## 2021-10-29 MED ORDER — ONETOUCH ULTRA VI STRP: ORAL_STRIP | 3 refills | Status: AC

## 2021-10-29 MED ORDER — METFORMIN HCL 500 MG PO TABS: ORAL_TABLET | ORAL | 3 refills | Status: DC

## 2021-10-29 MED ORDER — EZETIMIBE 10 MG PO TABS: 10.0000 mg | ORAL_TABLET | Freq: Every day | ORAL | 3 refills | Status: DC

## 2021-10-29 MED ORDER — ROSUVASTATIN CALCIUM 5 MG PO TABS: 5.0000 mg | ORAL_TABLET | ORAL | 3 refills | Status: DC

## 2021-10-29 MED ORDER — ALBUTEROL SULFATE HFA 108 (90 BASE) MCG/ACT IN AERS: 1.0000 | INHALATION_SPRAY | Freq: Four times a day (QID) | RESPIRATORY_TRACT | 11 refills | Status: AC | PRN

## 2021-10-29 NOTE — Progress Notes (Signed)
Chief Complaint  ?Patient presents with  ? Follow-up  ? Diabetes  ? ?6 month f/u  ?1. 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 Chronic sinus issues and pnd f/u with eNt  ? ?Review of Systems  ?Constitutional:  Negative for weight loss.  ?HENT:  Negative for hearing loss.   ?Eyes:  Negative for blurred vision.  ?Respiratory:  Negative for shortness of breath.   ?Cardiovascular:  Negative for chest pain.  ?Gastrointestinal:  Negative for abdominal pain and blood in stool.  ?Genitourinary:  Negative for dysuria.  ?Musculoskeletal:  Negative for back pain, falls and joint pain.  ?Skin:  Negative for rash.  ?Neurological:  Negative for headaches.  ?Psychiatric/Behavioral:  Negative for depression.   ?Past Medical History:  ?Diagnosis Date  ? Allergy   ? dust, grass   ? Diabetes mellitus   ? Freckle   ? left eye  ? Glaucoma   ? [place] [facility] q 6 months   ? Glaucoma   ? Hyperlipidemia   ? Migraines   ? rare   ? Reflux esophagitis   ? 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 Excised: 02/25/2016  ? Wears hearing aid in both ears   ? ?Past Surgical History:  ?Procedure Laterality Date  ? CATARACT EXTRACTION, BILATERAL    ? COLONOSCOPY WITH PROPOFOL N/A 09/11/2017  ? Procedure: COLONOSCOPY WITH PROPOFOL;  Surgeon: Manya Silvas, MD;  Location: [facility] ENDOSCOPY;  Service: Endoscopy;  Laterality: N/A;  ? ESOPHAGOGASTRODUODENOSCOPY (EGD) WITH PROPOFOL N/A 02/02/2018  ? Procedure: ESOPHAGOGASTRODUODENOSCOPY (EGD) WITH PROPOFOL;  Surgeon: [name], MD;  Location: [facility] ENDOSCOPY;  Service: Gastroenterology;  Laterality: N/A;  ? EYE SURGERY    ? glaucoma left , glaucoma right x 2 Dr. Dorna Bloom   ? HERNIA REPAIR    ? IMAGE GUIDED SINUS SURGERY Bilateral 02/23/2018  ? Procedure: IMAGE GUIDED SINUS SURGERY Right ethmoidectomy with frontal exploration bilteral maxillary antrostomies Right sphenoidectomy;  Surgeon: Clyde Canterbury, MD;  Location: [place];  Service: ENT;  Laterality: Bilateral;  NEED STRYKER DISK ?GAVE DISK TO [facility] 9-9  ? NASAL FRACTURE SURGERY    ? younger age  ? NASAL SINUS SURGERY    ? 04/26/21  ? UMBILICAL HERNIA REPAIR  2002  ? XI ROBOTIC ASSISTED PARAESOPHAGEAL HERNIA REPAIR N/A 08/30/2020  ? Procedure: XI ROBOTIC ASSISTED PARAESOPHAGEAL HERNIA REPAIR with Adrianne Allred, RNFA to assist;  Surgeon: Jules Husbands, MD;  Location: [facility];  Service: General;  Laterality: N/A;  ? ?Family History  ?Problem Relation Age of Onset  ? Diabetes Mother   ? Diabetes Sister   ? Heart disease Brother   ? Diabetes Brother   ? Heart disease Brother   ? Diabetes Brother   ? Diabetes Brother   ? Diabetes Sister   ? Diabetes Sister   ? ?Social History  ? ?Socioeconomic History  ? Marital status: Married  ?  Spouse name: Not on file  ? Number of children: 2  ? Years of education: Not on file  ? Highest education level: Not on file  ?Occupational History  ? Occupation: semi retired Museum/gallery curator asst,  does real estate appraisals  ?  Employer: SELF EMPLOYED  ?Tobacco Use  ? Smoking status: Former  ? Smokeless tobacco: Never  ? Tobacco comments:  ?  smoked some as teenager  ?Vaping Use  ? Vaping Use: Never used  ?Substance  and Sexual Activity  ? Alcohol use: No  ?  Alcohol/week: 0.0 standard drinks  ? Drug use: No  ? Sexual activity: Yes  ?Other Topics Concern  ? Not on file  ?Social History Narrative  ? No living will  ? No health care POA---okay with wife making decisions. Daughter Joelene Millin would be alternate  ? Would accept resuscitation  ? Not sure about tube feeds  ? Married   ? ?Social Determinants of Health  ? ?Financial Resource Strain: Medium Risk  ? Difficulty of Paying Living Expenses: Somewhat hard  ?Food Insecurity: No Food Insecurity  ? Worried About Charity fundraiser in the Last Year: Never true  ? Ran Out of Food in the Last Year: Never true  ?Transportation Needs: No Transportation Needs  ? Lack of  Transportation (Medical): No  ? Lack of Transportation (Non-Medical): No  ?Physical Activity: Sufficiently Active  ? Days of Exercise per Week: 4 days  ? Minutes of Exercise per Session: 60 min  ?Stress: No Stress Concern Present  ? Feeling of Stress : Not at all  ?Social Connections: Unknown  ? Frequency of Communication with Friends and Family: Not on file  ? Frequency of Social Gatherings with Friends and Family: Not on file  ? Attends Religious Services: Not on file  ? Active Member of Clubs or Organizations: Not on file  ? Attends Archivist Meetings: Not on file  ? Marital Status: Married  ?Intimate Partner Violence: Not At Risk  ? Fear of Current or Ex-Partner: No  ? Emotionally Abused: No  ? Physically Abused: No  ? Sexually Abused: No  ? ?Current Meds  ?Medication Sig  ? albuterol (VENTOLIN HFA) 108 (90 Base) MCG/ACT inhaler Inhale 1-2 puffs into the lungs every 6 (six) hours as needed for wheezing or shortness of breath.  ? bimatoprost (LUMIGAN) 0.01 % SOLN Place 1 drop into both eyes at bedtime.   ? Continuous Blood Gluc Sensor (FREESTYLE LIBRE 14 DAY SENSOR) MISC 1 Device by Does not apply route every 14 (fourteen) days.  ? empagliflozin (JARDIANCE) 25 MG TABS tablet Take 1 tablet (25 mg total) by mouth daily.  ? hydrocortisone 2.5 % cream Apply BID to affected areas on face and penis as needed for flares of dryness, itch, and flaking.  ? ketoconazole (NIZORAL) 2 % cream Apply to affected areas on face and penis 1-2 times per day. May use daily.  ? LANTUS SOLOSTAR 100 UNIT/ML Solostar Pen INJECT 28-30 UNITS SUBCUTANEOUSLY AT BEDTIME  ? Multiple Vitamin (MULTIVITAMIN) capsule Take 1 capsule by mouth daily.  ? ONETOUCH DELICA LANCETS FINE MISC Use to check blood sugar once a day. Dx Code E11.9  ? Probiotic Product (PROBIOTIC DAILY PO) Take 1 tablet by mouth daily.  ? timolol (TIMOPTIC) 0.5 % ophthalmic solution Place 1 drop into both eyes daily.  ? VITAMIN D, CHOLECALCIFEROL, PO Take 1 tablet by  mouth daily.  ? [DISCONTINUED] Continuous Blood Gluc Receiver (FREESTYLE LIBRE 2 READER) DEVI 1 Device by Does not apply route every 14 (fourteen) days.  ? [DISCONTINUED] Continuous Blood Gluc Sensor (FREESTYLE LIBRE 3 SENSOR) MISC 1 Device by Does not apply route 3 (three) times daily. Place 1 sensor on the skin every 14 days. Use to check glucose continuously  ? [DISCONTINUED] Insulin Pen Needle (PEN NEEDLES) 32G X 4 MM MISC 1 Units by Does not apply route daily. Use 1 pen needle daily with Lantus  ? [DISCONTINUED] metFORMIN (GLUCOPHAGE) 500 MG tablet TAKE 1-2  TABLETS BY MOUTH SEE ADMIN INSTURCTIONS. TAKE 1 TABLET BY MOUTH IN THE MORNING WITH FOOD AND 2 TABLETS IN THE EVENING WITH FOOD.  ? [DISCONTINUED] ONETOUCH ULTRA test strip USE 1 STRIP TO CHECK GLUCOSE TWICE DAILY  ? ?Allergies  ?Allergen Reactions  ? Codeine   ? Pioglitazone   ?  REACTION: achy  ? Sitagliptin Phosphate   ?  REACTION: muscle aches  ? Atorvastatin Other (See Comments)  ?  myalgia  ? Crestor [Rosuvastatin]   ?  Muscle aching  ? Other Rash  ? Tape Rash  ? ?Recent Results (from the past 2160 hour(s))  ?Hemoglobin A1c     Status: None  ? Collection Time: 09/16/21  8:47 AM  ?Result Value Ref Range  ? Hgb A1c MFr Bld 6.5 4.6 - 6.5 %  ?  Comment: Glycemic Control Guidelines for People with Diabetes:Non Diabetic:  <6%Goal of Therapy: <7%Additional Action Suggested:  >8%   ?CBC with Differential/Platelet     Status: Abnormal  ? Collection Time: 09/16/21  8:47 AM  ?Result Value Ref Range  ? WBC 5.9 4.0 - 10.5 K/uL  ? RBC 4.64 4.22 - 5.81 Mil/uL  ? Hemoglobin 15.2 13.0 - 17.0 g/dL  ? HCT 45.4 39.0 - 52.0 %  ? MCV 97.9 78.0 - 100.0 fl  ? MCHC 33.4 30.0 - 36.0 g/dL  ? RDW 13.6 11.5 - 15.5 %  ? Platelets 207.0 150.0 - 400.0 K/uL  ? Neutrophils Relative % 59.7 43.0 - 77.0 %  ? Lymphocytes Relative 24.3 12.0 - 46.0 %  ? Monocytes Relative 14.1 (H) 3.0 - 12.0 %  ? Eosinophils Relative 1.3 0.0 - 5.0 %  ? Basophils Relative 0.6 0.0 - 3.0 %  ? Neutro Abs 3.5 1.4  - 7.7 K/uL  ? Lymphs Abs 1.4 0.7 - 4.0 K/uL  ? Monocytes Absolute 0.8 0.1 - 1.0 K/uL  ? Eosinophils Absolute 0.1 0.0 - 0.7 K/uL  ? Basophils Absolute 0.0 0.0 - 0.1 K/uL  ?Lipid panel     Status: Abnormal  ? Collec

## 2021-10-29 NOTE — Patient Instructions (Signed)
Rib Fracture  A rib fracture is a break or crack in one of the bones of the ribs. The ribs are long, curved bones that wrap around your chest and attach to your spine and your breastbone. The ribs protect your heart, lungs, and other organs in the chest. A broken or cracked rib is often painful but is not usually serious. Most rib fractures heal on their own over time. However, rib fractures can be more serious if multiple ribs are broken or if broken ribs move out of place and push against other structures or organs. What are the causes? This condition is caused by: Repetitive movements with high force, such as pitching a baseball or having very bad coughing spells. A direct hit the chest, such as a sports injury, a car crash, or a fall. Cancer that has spread to the bones, which can weaken bones and cause them to break. What are the signs or symptoms? Symptoms of this condition include: Pain when you breathe in or cough. Pain when someone presses on the injured area. Feeling short of breath. How is this diagnosed? This condition is diagnosed with a physical exam and medical history. You may also have imaging tests, such as: Chest X-ray. CT scan. MRI. Bone scan. Chest ultrasound. How is this treated? Treatment for this condition depends on the severity of the fracture. Most rib fractures usually heal on their own in 1-3 months. Healing may take longer if you have a cough or if you do activities that make the injury worse. While you heal, you may be given medicines to control the pain. You will also be taught deep breathing exercises. Severe injuries may require hospitalization or surgery. Follow these instructions at home: Managing pain, stiffness, and swelling If directed, put ice on the injured area. To do this: Put ice in a plastic bag. Place a towel between your skin and the bag. Leave the ice on for 20 minutes, 2-3 times a day. Remove the ice if your skin turns bright red. This is  very important. If you cannot feel pain, heat, or cold, you have a greater risk of damage to the area. Take over-the-counter and prescription medicines only as told by your health care provider. Activity Avoid doing activities or movements that cause pain. Be careful during activities and avoid bumping the injured rib. Slowly increase your activity as told by your health care provider. General instructions Do deep breathing exercises as told by your health care provider. This helps prevent pneumonia, which is a common complication of a broken rib. Your health care provider may instruct you to: Take deep breaths several times a day. Try to cough several times a day, holding a pillow against the injured area. Use a device called incentive spirometer to practice deep breathing several times a day. Drink enough fluid to keep your urine pale yellow. Do not wear a rib belt or binder. These restrict breathing, which can lead to pneumonia. Keep all follow-up visits. This is important. Contact a health care provider if: You have a fever. Get help right away if: You have difficulty breathing or you are short of breath. You develop a cough that does not stop, or you cough up thick or bloody sputum. You have nausea, vomiting, or pain in your abdomen. Your pain gets worse and medicine does not help. These symptoms may represent a serious problem that is an emergency. Do not wait to see if the symptoms will go away. Get medical help right away. Call   your local emergency services (911 in the [place] Do not drive yourself to the hospital. Summary A rib fracture is a break or crack in one of the bones of the ribs. A broken or cracked rib is often painful but is not usually serious. Most rib fractures heal on their own over time. Treatment for this condition depends on the severity of the fracture. Avoid doing any activities or movements that cause pain. This information is not intended to replace advice  given to you by your health care provider. Make sure you discuss any questions you have with your health care provider. Document Revised: 09/23/2019 Document Reviewed: 09/23/2019 Elsevier Patient Education  2023 Elsevier Inc.  

## 2021-10-31 ENCOUNTER — Other Ambulatory Visit: Payer: Self-pay | Admitting: Internal Medicine

## 2021-10-31 ENCOUNTER — Telehealth: Payer: Self-pay

## 2021-10-31 DIAGNOSIS — J321 Chronic frontal sinusitis: Secondary | ICD-10-CM

## 2021-10-31 MED ORDER — AMOXICILLIN-POT CLAVULANATE 875-125 MG PO TABS: 1.0000 | ORAL_TABLET | Freq: Two times a day (BID) | ORAL | 0 refills | Status: DC

## 2021-10-31 NOTE — Telephone Encounter (Signed)
I sent albuterol inhaler day of his visit to [facility]

## 2021-10-31 NOTE — Telephone Encounter (Signed)
Sent augmentin to [facility] [address] 2x per day x 7-10 days

## 2021-10-31 NOTE — Telephone Encounter (Signed)
Patient's wife, Juliann Pulse, called to state patient is getting worse and would now like to have the prescription Dr. Olivia Mackie offered to give him at his recent visit.  Juliann Pulse states she would like for the patient to receive the medication sooner rather than later.  Juliann Pulse states she would like for [place] to call her when we send the prescription to the pharmacy.  *Patient's preferred pharmacy is [facility] on [address].

## 2021-11-01 NOTE — Telephone Encounter (Signed)
Informed pt's wife Juliann Pulse that augmentin was sent to pharmacy for pt to start 2x per day x 7-10 days. Wife verbalized understanding.

## 2021-11-15 DIAGNOSIS — H401133 Primary open-angle glaucoma, bilateral, severe stage: Secondary | ICD-10-CM | POA: Diagnosis not present

## 2021-11-19 ENCOUNTER — Ambulatory Visit: Payer: PPO | Admitting: Gastroenterology

## 2021-12-03 ENCOUNTER — Encounter: Payer: Self-pay | Admitting: Internal Medicine

## 2021-12-03 ENCOUNTER — Ambulatory Visit (INDEPENDENT_AMBULATORY_CARE_PROVIDER_SITE_OTHER): Payer: PPO | Admitting: Internal Medicine

## 2021-12-03 DIAGNOSIS — H6690 Otitis media, unspecified, unspecified ear: Secondary | ICD-10-CM

## 2021-12-03 DIAGNOSIS — H65192 Other acute nonsuppurative otitis media, left ear: Secondary | ICD-10-CM

## 2021-12-03 HISTORY — DX: Otitis media, unspecified, unspecified ear: H66.90

## 2021-12-03 MED ORDER — DOXYCYCLINE HYCLATE 100 MG PO TABS: 100.0000 mg | ORAL_TABLET | Freq: Two times a day (BID) | ORAL | 0 refills | Status: DC

## 2021-12-03 MED ORDER — LEVOFLOXACIN 500 MG PO TABS: 500.0000 mg | ORAL_TABLET | Freq: Every day | ORAL | 0 refills | Status: AC

## 2021-12-03 MED ORDER — PREDNISONE 10 MG PO TABS: ORAL_TABLET | ORAL | 0 refills | Status: DC

## 2021-12-03 NOTE — Assessment & Plan Note (Signed)
Secondary to prolonged sinus congestion.  Given his recent use of augmentin, will add levaquin/doxycycline for 7 days,  A steroid taper,  And afrin .  Already taking a probiotic

## 2021-12-03 NOTE — Progress Notes (Signed)
Subjective:  Patient ID: Alexis Sharp, male    DOB: 14-May-1946  Age: 76 y.o. MRN: [id number]  CC: The encounter diagnosis was Other non-recurrent acute nonsuppurative otitis media of left ear.   HPI Fabricio A Vannote IS A 76 YR OLD MALE WITH CHRONIC SINUSITIS  s/p extensive sinus surgery at [facility] in February 2023 presents with a "clogged ear" accompanied by chronic sinus drainage which has become purulent  Chief Complaint  Patient presents with   Follow-up    Clogged left ear    CLOGGED LEFT EAR . DENIES PAIN.  Tried USING DROPS FOR EARWAX REMOVAL.  Has noted loss of hearing   wears hearing aides.  Since his ENT surgery he has been Using a  strong saline flush,  and a steroid compounded nasal solution  on a regular basis.  He was treated in mid May by [facility] for acute sinusitis with augmentin.  He states that his acute symptoms resolved but he continues to have chronic sinus drainage and congestion. He has  been unable to get an appt with  his ENT Juan Quam ; in fact his July appt got bumped to August    Outpatient Medications Prior to Visit  Medication Sig Dispense Refill   albuterol (VENTOLIN HFA) 108 (90 Base) MCG/ACT inhaler Inhale 1-2 puffs into the lungs every 6 (six) hours as needed for wheezing or shortness of breath. 18 g 11   bimatoprost (LUMIGAN) 0.01 % SOLN Place 1 drop into both eyes at bedtime.      Continuous Blood Gluc Sensor (FREESTYLE LIBRE 14 DAY SENSOR) MISC 1 Device by Does not apply route every 14 (fourteen) days. 2 each 11   empagliflozin (JARDIANCE) 25 MG TABS tablet Take 1 tablet (25 mg total) by mouth daily. 90 tablet 3   ezetimibe (ZETIA) 10 MG tablet Take 1 tablet (10 mg total) by mouth daily. 90 tablet 3   glucose blood (ONETOUCH ULTRA) test strip USE 1 STRIP TO CHECK GLUCOSE TWICE DAILY 180 each 3   LANTUS SOLOSTAR 100 UNIT/ML Solostar Pen INJECT 28-30 UNITS SUBCUTANEOUSLY AT BEDTIME 27 mL 0   metFORMIN (GLUCOPHAGE) 500 MG tablet TAKE 1-2 TABLETS BY MOUTH SEE  ADMIN INSTURCTIONS. TAKE 1 TABLET BY MOUTH IN THE MORNING WITH FOOD AND 2 TABLETS IN THE EVENING WITH FOOD. 270 tablet 3   Multiple Vitamin (MULTIVITAMIN) capsule Take 1 capsule by mouth daily.     ONETOUCH DELICA LANCETS FINE MISC Use to check blood sugar once a day. Dx Code E11.9 100 each 3   Probiotic Product (PROBIOTIC DAILY PO) Take 1 tablet by mouth daily.     rosuvastatin (CRESTOR) 5 MG tablet Take 1 tablet (5 mg total) by mouth every other day. 90 tablet 3   timolol (TIMOPTIC) 0.5 % ophthalmic solution Place 1 drop into both eyes daily.     VITAMIN D, CHOLECALCIFEROL, PO Take 1 tablet by mouth daily.     amoxicillin-clavulanate (AUGMENTIN) 875-125 MG tablet Take 1 tablet by mouth 2 (two) times daily. With food (Patient not taking: Reported on 12/03/2021) 20 tablet 0   hydrocortisone 2.5 % cream Apply BID to affected areas on face and penis as needed for flares of dryness, itch, and flaking. (Patient not taking: Reported on 12/03/2021) 30 g 3   ketoconazole (NIZORAL) 2 % cream Apply to affected areas on face and penis 1-2 times per day. May use daily. (Patient not taking: Reported on 12/03/2021) 30 g 3   No facility-administered medications prior to  visit.    Review of Systems; sinus conges Patient denies headache, fevers, malaise, unintentional weight loss, skin rash, eye pain, sore throat, dysphagia,  hemoptysis , cough, dyspnea, wheezing, chest pain, palpitations, orthopnea, edema, abdominal pain, nausea, melena, diarrhea, constipation, flank pain, dysuria, hematuria, urinary  Frequency, nocturia, numbness, tingling, seizures,  Focal weakness, Loss of consciousness,  Tremor, insomnia, depression, anxiety, and suicidal ideation.      Objective:  BP 130/72 (BP Location: Left Arm, Patient Position: Sitting, Cuff Size: Normal)   Pulse 60   Temp 98.6 F (37 C) (Oral)   Ht '5\' 11"'$  (1.803 m)   Wt 190 lb 12.8 oz (86.5 kg)   SpO2 96%   BMI 26.61 kg/m   BP Readings from Last 3 Encounters:   12/03/21 130/72  10/29/21 128/84  04/30/21 130/84    Wt Readings from Last 3 Encounters:  12/03/21 190 lb 12.8 oz (86.5 kg)  10/29/21 190 lb (86.2 kg)  04/30/21 194 lb 12.8 oz (88.4 kg)    General appearance: alert, cooperative and appears stated age Ears: bilateral canals without cerumen;  left TM inflamed Throat: lips, mucosa, and tongue normal; teeth and gums normal Neck: no adenopathy, no carotid bruit, supple, symmetrical, trachea midline and thyroid not enlarged, symmetric, no tenderness/mass/nodules Back: symmetric, no curvature. ROM normal. No CVA tenderness. Lungs: clear to auscultation bilaterally Heart: regular rate and rhythm, S1, S2 normal, no murmur, click, rub or gallop Abdomen: soft, non-tender; bowel sounds normal; no masses,  no organomegaly Pulses: 2+ and symmetric Skin: Skin color, texture, turgor normal. No rashes or lesions Lymph nodes: Cervical, supraclavicular, and axillary nodes normal.  Lab Results  Component Value Date   HGBA1C 6.5 09/16/2021   HGBA1C 6.0 04/05/2021   HGBA1C 6.6 (H) 10/22/2020    Lab Results  Component Value Date   CREATININE 0.86 09/16/2021   CREATININE 0.88 04/05/2021   CREATININE 0.92 10/22/2020    Lab Results  Component Value Date   WBC 5.9 09/16/2021   HGB 15.2 09/16/2021   HCT 45.4 09/16/2021   PLT 207.0 09/16/2021   GLUCOSE 129 (H) 09/16/2021   CHOL 72 09/16/2021   TRIG 57.0 09/16/2021   HDL 36.00 (L) 09/16/2021   LDLDIRECT 155.7 02/24/2012   LDLCALC 25 09/16/2021   ALT 16 09/16/2021   AST 18 09/16/2021   NA 140 09/16/2021   K 4.6 09/16/2021   CL 103 09/16/2021   CREATININE 0.86 09/16/2021   BUN 16 09/16/2021   CO2 29 09/16/2021   TSH 1.88 04/05/2021   PSA 0.60 04/05/2021   HGBA1C 6.5 09/16/2021   MICROALBUR <0.7 03/09/2020    CT CARDIAC SCORING (SELF PAY ONLY)  Addendum Date: 02/16/2021   ADDENDUM REPORT: 02/16/2021 08:51 EXAM: OVER-READ INTERPRETATION  CT CHEST The following report is an over-read  performed by radiologist Dr. Barnetta Hammersmith [facility] on 02/16/2021. This over-read does not include interpretation of cardiac or coronary anatomy or pathology. The coronary calcium score interpretation by the cardiologist is attached. COMPARISON:  CT of the chest, abdomen and pelvis on 07/06/2020 FINDINGS: Stable mild dilatation of the visualized descending thoracic aorta measuring up to approximately 3.3 cm on the unenhanced study. Mild calcified plaque is present in the descending thoracic aorta. Visualized mediastinum and hilar regions demonstrate no lymphadenopathy or masses. Visualized lungs demonstrate scarring in the left lower lobe and medial right lower lobe. Stable hepatic cyst in the superior aspect of the left lobe of the liver. Visualized bony structures are unremarkable. IMPRESSION: 1. 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 2. Stable appearance a hepatic cyst in the superior aspect of the left lobe of the liver. Electronically Signed   By: Aletta Edouard M.D.   On: 02/16/2021 08:51   Result Date: 02/16/2021 CLINICAL DATA:  Risk stratification EXAM: Coronary Calcium Score TECHNIQUE: The patient was scanned on a Siemens Somatom go.Top Scanner. Axial non-contrast 3 mm slices were carried out through the heart. The data set was analyzed on a dedicated work station and scored using the [employer]. FINDINGS: Non-cardiac: See separate report from [facility]. Ascending Aorta: Normal size Pericardium: Normal Coronary arteries: Normal origin of left and right coronary arteries. Distribution of arterial calcifications if present, as noted below; LM 331 LAD 227 LCx 14 RCA 11.1 Total 583 IMPRESSION AND RECOMMENDATION: 1. Coronary calcium score of 583. This was 71st percentile for age and sex matched control. 2. CAC >300 in LM, LAD, LCx, RCA. CAC-DRS A3/N3. 3. Recommend asa and statin if no contraindications. 4. Continue heart  healthy lifestyle and risk factor modification. Kate Sable Electronically Signed: By: Kate Sable M.D. On: 02/15/2021 17:07    Assessment & Plan:   Problem List Items Addressed This Visit     Otitis media    Secondary to prolonged sinus congestion.  Given his recent use of augmentin, will add levaquin/doxycycline for 7 days,  A steroid taper,  And afrin .  Already taking a probiotic       Relevant Medications   levofloxacin (LEVAQUIN) 500 MG tablet   doxycycline (VIBRA-TABS) 100 MG tablet    I spent a total of 27  minutes with this patient in a face to face visit on the date of this encounter reviewing the last office visit with Dr Kelly Services  in May,   most recent ENT evaluation in march  prior surgical report ,   and post visit ordering of testing and therapeutics.    Follow-up: No follow-ups on file.   Crecencio Mc, MD

## 2021-12-03 NOTE — Patient Instructions (Addendum)
You have  otitis media because of chronic sinus congestion  I recommend a round of 2 antibiotics (levofloxacin once daily and doxycycline twice daily   to make sure the otitis does not progress,  Along with a  8 day course of prednisone to deal with the inflammation.    You can add  Afrin nasal spray twice daily for 5 days for sinus  and ear congestion.    Daily use of Probiotics for  3 weeks advised to reduce risk of C dificile colitis.  This includes yogurt,  Especially Activia one serving daily

## 2021-12-12 ENCOUNTER — Telehealth: Payer: Self-pay | Admitting: Internal Medicine

## 2021-12-12 NOTE — Telephone Encounter (Signed)
Copied from [place] [id number]. Topic: Medicare AWV >> Dec 12, 2021 11:40 AM Devoria Glassing wrote: Reason for CRM: Called patient to schedule Annual Wellness Visit.  Please schedule with Nurse Health Advisor Denisa O'Brien-Blaney, LPN at [facility].  Please call [phone] ask for [facility]

## 2021-12-19 DIAGNOSIS — J329 Chronic sinusitis, unspecified: Secondary | ICD-10-CM | POA: Diagnosis not present

## 2021-12-19 DIAGNOSIS — H6502 Acute serous otitis media, left ear: Secondary | ICD-10-CM | POA: Diagnosis not present

## 2021-12-24 ENCOUNTER — Telehealth: Payer: Self-pay | Admitting: Internal Medicine

## 2021-12-24 DIAGNOSIS — Z0279 Encounter for issue of other medical certificate: Secondary | ICD-10-CM
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
## 2021-12-24 NOTE — Telephone Encounter (Addendum)
Patient dropped off [employer] paper work to be completed by Dr Olivia Mackie. [place] office tried to schedule an appointment to come into office to have paper work completed, patient said he didn't think he would need to come in, he never saw his provider before to have paper work filled out. Paper work is upfront in Dr [employer].

## 2022-01-05 ENCOUNTER — Other Ambulatory Visit: Payer: Self-pay | Admitting: Internal Medicine

## 2022-01-05 DIAGNOSIS — Z794 Long term (current) use of insulin: Secondary | ICD-10-CM

## 2022-01-07 ENCOUNTER — Ambulatory Visit (INDEPENDENT_AMBULATORY_CARE_PROVIDER_SITE_OTHER): Payer: PPO

## 2022-01-07 VITALS — Ht 71.0 in | Wt 190.0 lb

## 2022-01-07 DIAGNOSIS — Z0000 Encounter for general adult medical examination without abnormal findings: Secondary | ICD-10-CM | POA: Diagnosis not present

## 2022-01-07 NOTE — Patient Instructions (Signed)
  [name] , Thank you for taking time to come for your Medicare Wellness Visit. I appreciate your ongoing commitment to your health goals. Please review the following plan we discussed and let me know if I can assist you in the future.   These are the goals we discussed:  Goals       Patient Stated     Medication Monitoring (pt-stated)      Patient Goals/Self-Care Activities Over the next 90 days, patient will:  - take medications as prescribed check glucose daily, document, and provide at future appointments      Other     Maintain healthy lifestyle      Stay active Healthy diet Stay hydrated        This is a list of the screening recommended for you and due dates:  Health Maintenance  Topic Date Due   Flu Shot  01/14/2022   Hemoglobin A1C  03/18/2022   Urine Protein Check  04/05/2022   Complete foot exam   04/30/2022   Eye exam for diabetics  07/31/2022   Tetanus Vaccine  06/30/2023   Colon Cancer Screening  07/31/2030   Pneumonia Vaccine  Completed   COVID-19 Vaccine  Completed   Hepatitis C Screening: USPSTF Recommendation to screen - Ages 50-79 yo.  Completed   Zoster (Shingles) Vaccine  Completed   HPV Vaccine  Aged Out

## 2022-01-07 NOTE — Progress Notes (Signed)
Subjective:   SHEPPARD LUCKENBACH is a 76 y.o. male who presents for Medicare Annual/Subsequent preventive examination.  Review of Systems    No ROS.  Medicare Wellness Virtual Visit.  Visual/audio telehealth visit, UTA vital signs.   See social history for additional risk factors.   Cardiac Risk Factors include: advanced age (>14mn, >>53women);diabetes mellitus;male gender     Objective:    Today's Vitals   01/07/22 0833  Weight: 190 lb (86.2 kg)  Height: '5\' 11"'$  (1.803 m)   Body mass index is 26.5 kg/m.     01/07/2022    8:37 AM 12/12/2020   10:53 AM 08/30/2020   10:15 AM 08/23/2020    2:46 PM 08/06/2018   12:12 PM 02/23/2018    7:53 AM 02/02/2018    7:46 AM  Advanced Directives  Does Patient Have a Medical Advance Directive? No No No No No No No  Would patient like information on creating a medical advance directive? No - Patient declined No - Patient declined No - Patient declined No - Patient declined No - Patient declined No - Patient declined No - Patient declined    Current Medications (verified) Outpatient Encounter Medications as of 01/07/2022  Medication Sig   albuterol (VENTOLIN HFA) 108 (90 Base) MCG/ACT inhaler Inhale 1-2 puffs into the lungs every 6 (six) hours as needed for wheezing or shortness of breath.   amoxicillin-clavulanate (AUGMENTIN) 875-125 MG tablet Take 1 tablet by mouth 2 (two) times daily. With food (Patient not taking: Reported on 12/03/2021)   bimatoprost (LUMIGAN) 0.01 % SOLN Place 1 drop into both eyes at bedtime.    Continuous Blood Gluc Sensor (FREESTYLE LIBRE 14 DAY SENSOR) MISC 1 Device by Does not apply route every 14 (fourteen) days.   doxycycline (VIBRA-TABS) 100 MG tablet Take 1 tablet (100 mg total) by mouth 2 (two) times daily.   empagliflozin (JARDIANCE) 25 MG TABS tablet Take 1 tablet (25 mg total) by mouth daily.   ezetimibe (ZETIA) 10 MG tablet Take 1 tablet (10 mg total) by mouth daily.   glucose blood (ONETOUCH ULTRA) test strip  USE 1 STRIP TO CHECK GLUCOSE TWICE DAILY   hydrocortisone 2.5 % cream Apply BID to affected areas on face and penis as needed for flares of dryness, itch, and flaking. (Patient not taking: Reported on 12/03/2021)   ketoconazole (NIZORAL) 2 % cream Apply to affected areas on face and penis 1-2 times per day. May use daily. (Patient not taking: Reported on 12/03/2021)   LANTUS SOLOSTAR 100 UNIT/ML Solostar Pen INJECT 28-30 UNITS SUBCUTANEOUSLY AT BEDTIME   metFORMIN (GLUCOPHAGE) 500 MG tablet TAKE 1-2 TABLETS BY MOUTH SEE ADMIN INSTURCTIONS. TAKE 1 TABLET BY MOUTH IN THE MORNING WITH FOOD AND 2 TABLETS IN THE EVENING WITH FOOD.   Multiple Vitamin (MULTIVITAMIN) capsule Take 1 capsule by mouth daily.   ONETOUCH DELICA LANCETS FINE MISC Use to check blood sugar once a day. Dx Code E11.9   predniSONE (DELTASONE) 10 MG tablet 6 tablets daily for 3 days, then reduce by 1 tablet daily until gone   Probiotic Product (PROBIOTIC DAILY PO) Take 1 tablet by mouth daily.   rosuvastatin (CRESTOR) 5 MG tablet Take 1 tablet (5 mg total) by mouth every other day.   timolol (TIMOPTIC) 0.5 % ophthalmic solution Place 1 drop into both eyes daily.   VITAMIN D, CHOLECALCIFEROL, PO Take 1 tablet by mouth daily.   No facility-administered encounter medications on file as of 01/07/2022.    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 Excised: 02/25/2016   Wears hearing aid in both ears    Past Surgical History:  Procedure Laterality Date   CATARACT EXTRACTION, BILATERAL     COLONOSCOPY WITH  PROPOFOL N/A 09/11/2017   Procedure: COLONOSCOPY WITH PROPOFOL;  Surgeon: Manya Silvas, MD;  Location: [facility] ENDOSCOPY;  Service: Endoscopy;  Laterality: N/A;   ESOPHAGOGASTRODUODENOSCOPY (EGD) WITH PROPOFOL N/A 02/02/2018   Procedure: ESOPHAGOGASTRODUODENOSCOPY (EGD) WITH PROPOFOL;  Surgeon: [name], MD;  Location: [facility] ENDOSCOPY;  Service: Gastroenterology;  Laterality: N/A;   EYE SURGERY     glaucoma left , glaucoma right x 2 Dr. Dorna Bloom    HERNIA REPAIR     IMAGE GUIDED SINUS SURGERY Bilateral 02/23/2018   Procedure: IMAGE GUIDED SINUS SURGERY Right ethmoidectomy with frontal exploration bilteral maxillary antrostomies Right sphenoidectomy;  Surgeon: Clyde Canterbury, MD;  Location: [place];  Service: ENT;  Laterality: Bilateral;  NEED STRYKER DISK GAVE DISK TO [facility] 9-9   NASAL FRACTURE SURGERY     younger age   NASAL SINUS SURGERY     [birth year]   UMBILICAL HERNIA REPAIR  2002   XI ROBOTIC ASSISTED PARAESOPHAGEAL HERNIA REPAIR N/A 08/30/2020   Procedure: XI ROBOTIC ASSISTED PARAESOPHAGEAL HERNIA REPAIR with Armida Sans, RNFA to assist;  Surgeon: Jules Husbands, MD;  Location: [facility] ORS;  Service: General;  Laterality: N/A;   Family History  Problem Relation Age of Onset   Diabetes Mother    Diabetes Sister    Heart disease Brother    Diabetes Brother    Heart disease Brother    Diabetes Brother    Diabetes Brother    Diabetes Sister    Diabetes Sister    Social History   Socioeconomic History   Marital status: Married    Spouse name: Not on file   Number of children: 2   Years of education: Not on file   Highest education level: Not on file  Occupational History   Occupation: semi retired Museum/gallery curator asst,  does Engineer, manufacturing: SELF EMPLOYED  Tobacco Use   Smoking status: Former   Smokeless tobacco: Never   Tobacco comments:    smoked some as teenager  Scientific laboratory technician Use: Never used  Substance and Sexual Activity   Alcohol  use: No    Alcohol/week: 0.0 standard drinks of alcohol   Drug use: No   Sexual activity: Yes  Other Topics Concern   Not on file  Social History Narrative   No living will   No health care POA---okay with wife making decisions. Daughter Joelene Millin would be alternate   Would accept resuscitation   Not sure about tube feeds   Married    Social Determinants of Health   Financial Resource Strain: Low Risk  (01/07/2022)   Overall Financial Resource Strain (CARDIA)    Difficulty of Paying Living Expenses: Not very hard  Food Insecurity: No Food Insecurity (01/07/2022)   Hunger Vital Sign  Worried About Charity fundraiser in the Last Year: Never true    [place] in the Last Year: Never true  Transportation Needs: No Transportation Needs (01/07/2022)   PRAPARE - Hydrologist (Medical): No    Lack of Transportation (Non-Medical): No  Physical Activity: Sufficiently Active (01/07/2022)   Exercise Vital Sign    Days of Exercise per Week: 4 days    Minutes of Exercise per Session: 60 min  Stress: No Stress Concern Present (01/07/2022)   Wise    Feeling of Stress : Not at all  Social Connections: Unknown (01/07/2022)   Social Connection and Isolation Panel [NHANES]    Frequency of Communication with Friends and Family: Not on file    Frequency of Social Gatherings with Friends and Family: Not on file    Attends Religious Services: Not on file    Active Member of Clubs or Organizations: Not on file    Attends Archivist Meetings: Not on file    Marital Status: Married    Tobacco Counseling Counseling given: Not Answered Tobacco comments: smoked some as teenager   Clinical Intake: Pre-visit preparation completed: Yes        Diabetes: Yes (Followed by PCP)  How often do you need to have someone help you when you read instructions, pamphlets, or other written  materials from your doctor or pharmacy?: 1 - Never   Interpreter Needed?: No     Activities of Daily Living    01/07/2022    8:38 AM  In your present state of health, do you have any difficulty performing the following activities:  Hearing? 1  Comment Hearing aids  Vision? 0  Difficulty concentrating or making decisions? 0  Walking or climbing stairs? 0  Dressing or bathing? 0  Doing errands, shopping? 0  Preparing Food and eating ? N  Using the Toilet? N  In the past six months, have you accidently leaked urine? N  Do you have problems with loss of bowel control? N  Managing your Medications? N  Managing your Finances? N  Housekeeping or managing your Housekeeping? N   Patient Care Team: McLean-Scocuzza, Nino Glow, MD as PCP - General (Internal Medicine) Marylynn Pearson, MD as Consulting Physician (Ophthalmology)  Indicate any recent Medical Services you may have received from other than [facility] providers in the past year (date may be approximate).     Assessment:   This is a routine wellness examination for [place].  Virtual Visit via Telephone Note  I connected with  Tama High on 01/07/22 at  8:15 AM EDT by telephone and verified that I am speaking with the correct person using two identifiers.  Location: Patient: home Provider: office Persons participating in the virtual visit: patient/Nurse Health Advisor   I discussed the limitations of performing an evaluation and management service by telehealth. We continued and completed visit with audio only. Some vital signs may be absent or patient reported.   Hearing/Vision screen Hearing Screening - Comments:: Hearing aids Vision Screening - Comments:: Followed by [facility], Dr. Dorna Bloom  Wears corrective lenses  No retinopathy  Cataract extraction, bilateral   Dietary issues and exercise activities discussed: Current Exercise Habits: Home exercise routine, Type of exercise: walking, Intensity: Mild    Goals Addressed             This Visit's Progress    Maintain healthy lifestyle  Stay active Healthy diet Stay hydrated       Depression Screen    01/07/2022    8:35 AM 12/03/2021   11:23 AM 10/29/2021    7:50 AM 12/12/2020   10:51 AM 01/20/2020    2:49 PM 08/06/2018   11:14 AM 12/23/2017    9:48 AM  PHQ 2/9 Scores  PHQ - 2 Score 0 0 0 0 0 0 0  PHQ- 9 Score    0       Fall Risk    01/07/2022    8:37 AM 12/03/2021   11:23 AM 10/29/2021    7:49 AM 12/12/2020   10:36 AM 10/24/2020    8:06 AM  Fall Risk   Falls in the past year? 0 0 0 0 0  Number falls in past yr: 0  0 0 0  Injury with Fall?   0 0 0  Risk for fall due to :  No Fall Risks No Fall Risks    Follow up Falls evaluation completed Falls evaluation completed Falls evaluation completed Falls evaluation completed Falls evaluation completed    [place]: Home free of loose throw rugs in walkways, pet beds, electrical cords, etc? Yes  Adequate lighting in your home to reduce risk of falls? Yes   ASSISTIVE DEVICES UTILIZED TO PREVENT FALLS: Life alert? No  Use of a cane, walker or w/c? No   TIMED UP AND GO: Was the test performed? No .   Cognitive Function: Patient is alert and oriented x3.      10/24/2015    8:54 AM  MMSE - Mini Mental State Exam  Orientation to time 5  Orientation to Place 5  Registration 3  Attention/ Calculation 0  Recall 3  Language- name 2 objects 0  Language- repeat 1  Language- follow 3 step command 3  Language- read & follow direction 0  Write a sentence 0  Copy design 0  Total score 20        08/06/2018   12:14 PM  6CIT Screen  What Year? 0 points  What month? 0 points  What time? 0 points  Count back from 20 0 points  Months in reverse 0 points  Repeat phrase 0 points  Total Score 0 points    Immunizations Immunization History  Administered Date(s) Administered   Fluad Quad(high Dose 65+) 06/22/2020   Influenza Whole  04/19/2007, 03/15/2009, 04/16/2010   Influenza, High Dose Seasonal PF 03/18/2018   Influenza, Seasonal, Injecte, Preservative Fre 03/30/2016   Influenza,inj,Quad PF,6+ Mos 03/23/2015, 03/31/2017, 02/24/2018   Influenza-Unspecified 04/18/2013, 04/16/2014, 03/16/2021   PFIZER Comirnaty(Gray Top)Covid-19 Tri-Sucrose Vaccine 07/16/2019, 08/07/2019, 09/28/2020   PFIZER(Purple Top)SARS-COV-2 Vaccination 03/13/2020   Pfizer Covid-19 Vaccine Bivalent Booster 44yr & up 02/28/2021   Pneumococcal Conjugate-13 07/20/2014   Pneumococcal Polysaccharide-23 04/16/2010, 10/24/2015   Td 06/17/2003, 06/29/2013   Zoster Recombinat (Shingrix) 12/09/2017, 03/15/2018, 07/15/2018   Zoster, Live 12/25/2010   Screening Tests Health Maintenance  Topic Date Due   INFLUENZA VACCINE  01/14/2022   HEMOGLOBIN A1C  03/18/2022   URINE MICROALBUMIN  04/05/2022   FOOT EXAM  04/30/2022   OPHTHALMOLOGY EXAM  07/31/2022   TETANUS/TDAP  06/30/2023   COLONOSCOPY (Pts 45-486yrInsurance coverage will need to be confirmed)  07/31/2030   Pneumonia Vaccine 6576Years old  Completed   COVID-19 Vaccine  Completed   Hepatitis C Screening  Completed   Zoster Vaccines- Shingrix  Completed   HPV VACCINES  Aged Out  Health Maintenance There are no preventive care reminders to display for this patient.  Lung Cancer Screening: (Low Dose CT Chest recommended if Age 73-80 years, 30 pack-year currently smoking OR have quit w/in 15years.) does not qualify.   Vision Screening: Recommended annual ophthalmology exams for early detection of glaucoma and other disorders of the eye.  Dental Screening: Recommended annual dental exams for proper oral hygiene  Community Resource Referral / Chronic Care Management: CRR required this visit?  No   CCM required this visit?  No      Plan:     I have personally reviewed and noted the following in the patient's chart:   Medical and social history Use of alcohol, tobacco or illicit drugs   Current medications and supplements including opioid prescriptions. Patient is not currently taking opioid prescriptions. Functional ability and status Nutritional status Physical activity Advanced directives List of other physicians Hospitalizations, surgeries, and ER visits in previous 12 months Vitals Screenings to include cognitive, depression, and falls Referrals and appointments  In addition, I have reviewed and discussed with patient certain preventive protocols, quality metrics, and best practice recommendations. A written personalized care plan for preventive services as well as general preventive health recommendations were provided to patient.     Varney Biles, LPN   4/78/2956

## 2022-01-08 ENCOUNTER — Encounter: Payer: Self-pay | Admitting: Internal Medicine

## 2022-01-08 NOTE — Telephone Encounter (Signed)
S/w pt & informed him that form has been completed by PCP & is ready for p/u at the front desk. Pt verbalized understanding.

## 2022-01-08 NOTE — Telephone Encounter (Signed)
Form complete

## 2022-01-09 DIAGNOSIS — D3131 Benign neoplasm of right choroid: Secondary | ICD-10-CM | POA: Diagnosis not present

## 2022-01-14 DIAGNOSIS — J329 Chronic sinusitis, unspecified: Secondary | ICD-10-CM | POA: Diagnosis not present

## 2022-01-14 DIAGNOSIS — J310 Chronic rhinitis: Secondary | ICD-10-CM | POA: Diagnosis not present

## 2022-01-31 ENCOUNTER — Telehealth: Payer: Self-pay | Admitting: Internal Medicine

## 2022-01-31 NOTE — Telephone Encounter (Signed)
Patient called and is requesting a written prescription for  empagliflozin (JARDIANCE) 25 MG TABS tablet. Please call when ready for pick up. Patient was advised that Dr Olivia Mackie will not be back into office until 02/04/22.

## 2022-02-03 NOTE — Telephone Encounter (Signed)
This can wait for [place] since it is a dose CHANGE

## 2022-02-04 ENCOUNTER — Other Ambulatory Visit: Payer: Self-pay | Admitting: Internal Medicine

## 2022-02-04 DIAGNOSIS — E1159 Type 2 diabetes mellitus with other circulatory complications: Secondary | ICD-10-CM

## 2022-02-04 DIAGNOSIS — E119 Type 2 diabetes mellitus without complications: Secondary | ICD-10-CM

## 2022-02-04 MED ORDER — EMPAGLIFLOZIN 25 MG PO TABS: 25.0000 mg | ORAL_TABLET | Freq: Every day | ORAL | 3 refills | Status: DC

## 2022-02-04 NOTE — Telephone Encounter (Signed)
Rx ready.

## 2022-02-05 ENCOUNTER — Telehealth: Payer: Self-pay

## 2022-02-05 NOTE — Telephone Encounter (Signed)
Called pts wife to notify her that her husbands prescription was ready to be picked up from the office, it will be in folder up front   McLean-Scocuzza, Nino Glow, MD  You 16 hours ago (4:24 PM)   TM Rx ready        Note    You  McLean-Scocuzza, Nino Glow, MD 22 hours ago (11:05 AM)    Pt is requesting a written prescription for  empagliflozin (JARDIANCE) 25 MG TABS tablet.

## 2022-02-05 NOTE — Telephone Encounter (Signed)
Pts wife been notified couldn't reach pt rx is upfront for pt pick up

## 2022-03-11 DIAGNOSIS — J324 Chronic pansinusitis: Secondary | ICD-10-CM | POA: Diagnosis not present

## 2022-03-16 ENCOUNTER — Other Ambulatory Visit: Payer: Self-pay | Admitting: Family

## 2022-03-16 DIAGNOSIS — E119 Type 2 diabetes mellitus without complications: Secondary | ICD-10-CM

## 2022-03-18 ENCOUNTER — Ambulatory Visit: Payer: PPO | Admitting: Dermatology

## 2022-03-24 ENCOUNTER — Telehealth: Payer: Self-pay

## 2022-03-24 ENCOUNTER — Other Ambulatory Visit: Payer: Self-pay

## 2022-03-24 DIAGNOSIS — E119 Type 2 diabetes mellitus without complications: Secondary | ICD-10-CM

## 2022-03-24 MED ORDER — LANTUS SOLOSTAR 100 UNIT/ML ~~LOC~~ SOPN: PEN_INJECTOR | SUBCUTANEOUS | 0 refills | Status: DC

## 2022-03-24 NOTE — Telephone Encounter (Signed)
Patient states he needs a refill on his LANTUS SOLOSTAR 100 UNIT/ML Solostar Pen.  Patient states the [place] on [address] has sent a request to [place] for the refill.  Patient states he has part of a pen left.  Patient requests that we please call him when the refill has been sent to the pharmacy.

## 2022-03-24 NOTE — Telephone Encounter (Signed)
Refill sent and pt notified. 

## 2022-03-27 DIAGNOSIS — Z6825 Body mass index (BMI) 25.0-25.9, adult: Secondary | ICD-10-CM | POA: Diagnosis not present

## 2022-03-27 DIAGNOSIS — L03032 Cellulitis of left toe: Secondary | ICD-10-CM | POA: Diagnosis not present

## 2022-03-27 DIAGNOSIS — Z23 Encounter for immunization: Secondary | ICD-10-CM | POA: Diagnosis not present

## 2022-03-28 DIAGNOSIS — H401133 Primary open-angle glaucoma, bilateral, severe stage: Secondary | ICD-10-CM | POA: Diagnosis not present

## 2022-04-10 ENCOUNTER — Telehealth: Payer: Self-pay | Admitting: Internal Medicine

## 2022-04-10 ENCOUNTER — Other Ambulatory Visit: Payer: Self-pay

## 2022-04-10 MED ORDER — AZELASTINE HCL 0.1 % NA SOLN: NASAL | 1 refills | Status: DC

## 2022-04-10 NOTE — Telephone Encounter (Signed)
Patient is requesting a refill on his azelastin nose spray, patient does not use on a regular bases

## 2022-04-21 ENCOUNTER — Ambulatory Visit: Payer: PPO | Admitting: Dermatology

## 2022-04-21 ENCOUNTER — Ambulatory Visit (INDEPENDENT_AMBULATORY_CARE_PROVIDER_SITE_OTHER): Payer: PPO | Admitting: Family Medicine

## 2022-04-21 ENCOUNTER — Encounter: Payer: Self-pay | Admitting: Family Medicine

## 2022-04-21 VITALS — BP 118/60 | HR 59 | Temp 98.3°F | Ht 71.0 in | Wt 188.8 lb

## 2022-04-21 DIAGNOSIS — I152 Hypertension secondary to endocrine disorders: Secondary | ICD-10-CM

## 2022-04-21 DIAGNOSIS — E119 Type 2 diabetes mellitus without complications: Secondary | ICD-10-CM | POA: Diagnosis not present

## 2022-04-21 DIAGNOSIS — E1159 Type 2 diabetes mellitus with other circulatory complications: Secondary | ICD-10-CM | POA: Diagnosis not present

## 2022-04-21 DIAGNOSIS — L820 Inflamed seborrheic keratosis: Secondary | ICD-10-CM | POA: Diagnosis not present

## 2022-04-21 DIAGNOSIS — L821 Other seborrheic keratosis: Secondary | ICD-10-CM | POA: Diagnosis not present

## 2022-04-21 DIAGNOSIS — T466X5A Adverse effect of antihyperlipidemic and antiarteriosclerotic drugs, initial encounter: Secondary | ICD-10-CM | POA: Diagnosis not present

## 2022-04-21 DIAGNOSIS — L578 Other skin changes due to chronic exposure to nonionizing radiation: Secondary | ICD-10-CM

## 2022-04-21 DIAGNOSIS — Z794 Long term (current) use of insulin: Secondary | ICD-10-CM | POA: Diagnosis not present

## 2022-04-21 DIAGNOSIS — G720 Drug-induced myopathy: Secondary | ICD-10-CM

## 2022-04-21 DIAGNOSIS — L570 Actinic keratosis: Secondary | ICD-10-CM

## 2022-04-21 DIAGNOSIS — L814 Other melanin hyperpigmentation: Secondary | ICD-10-CM

## 2022-04-21 DIAGNOSIS — E785 Hyperlipidemia, unspecified: Secondary | ICD-10-CM | POA: Diagnosis not present

## 2022-04-21 NOTE — Patient Instructions (Addendum)
Prior to procedure, discussed risks of blister formation, small wound, skin dyspigmentation, or rare scar following cryotherapy. Recommend Vaseline ointment to treated areas while healing.   Due to recent changes in healthcare laws, you may see results of your pathology and/or laboratory studies on MyChart before the doctors have had a chance to review them. We understand that in some cases there may be results that are confusing or concerning to you. Please understand that not all results are received at the same time and often the doctors may need to interpret multiple results in order to provide you with the best plan of care or course of treatment. Therefore, we ask that you please give [place] 2 business days to thoroughly review all your results before contacting the office for clarification. Should we see a critical lab result, you will be contacted sooner.   If You Need Anything After Your Visit  If you have any questions or concerns for your doctor, please call our main line at [phone] and press option 4 to reach your doctor's medical assistant. If no one answers, please leave a voicemail as directed and we will return your call as soon as possible. Messages left after 4 pm will be answered the following business day.   You may also send [place] a message via [place]. We typically respond to MyChart messages within 1-2 business days.  For prescription refills, please ask your pharmacy to contact our office. Our fax number is [phone].  If you have an urgent issue when the clinic is closed that cannot wait until the next business day, you can page your doctor at the number below.    Please note that while we do our best to be available for urgent issues outside of office hours, we are not available 24/7.   If you have an urgent issue and are unable to reach [place], you may choose to seek medical care at your doctor's office, retail clinic, urgent care center, or emergency room.  If you have a  medical emergency, please immediately call 911 or go to the emergency department.  Pager Numbers  - Dr. Nehemiah Massed: [phone]  - Dr. Laurence Ferrari: [phone]  - Dr. Nicole Kindred: [phone]  In the event of inclement weather, please call our main line at [phone] for an update on the status of any delays or closures.  Dermatology Medication Tips: Please keep the boxes that topical medications come in in order to help keep track of the instructions about where and how to use these. Pharmacies typically print the medication instructions only on the boxes and not directly on the medication tubes.   If your medication is too expensive, please contact our office at [phone] option 4 or send [place] a message through Advance.   We are unable to tell what your co-pay for medications will be in advance as this is different depending on your insurance coverage. However, we may be able to find a substitute medication at lower cost or fill out paperwork to get insurance to cover a needed medication.   If a prior authorization is required to get your medication covered by your insurance company, please allow [place] 1-2 business days to complete this process.  Drug prices often vary depending on where the prescription is filled and some pharmacies may offer cheaper prices.  The website www.goodrx.com contains coupons for medications through different pharmacies. The prices here do not account for what the cost may be with help from insurance (it may be cheaper with your insurance),  but the website can give you the price if you did not use any insurance.  - You can print the associated coupon and take it with your prescription to the pharmacy.  - You may also stop by our office during regular business hours and pick up a GoodRx coupon card.  - If you need your prescription sent electronically to a different pharmacy, notify our office through [facility] or by phone at [phone] option 4.     Si  Usted Necesita Algo Despus de Su Visita  Tambin puede enviarnos un mensaje a travs de Pharmacist, community. Por lo general respondemos a los mensajes de MyChart en el transcurso de 1 a 2 das hbiles.  Para renovar recetas, por favor pida a su farmacia que se ponga en contacto con nuestra oficina. Harland Dingwall de fax es [place] [phone].  Si tiene un asunto urgente cuando la clnica est cerrada y que no puede esperar hasta el siguiente da hbil, puede llamar/localizar a su doctor(a) al nmero que aparece a continuacin.   Por favor, tenga en cuenta que aunque hacemos todo lo posible para estar disponibles para asuntos urgentes fuera del horario de [place], no estamos disponibles las 24 horas del da, los 7 das de la [place].   Si tiene un problema urgente y no puede comunicarse con nosotros, puede optar por buscar atencin mdica  en el consultorio de su doctor(a), en una clnica privada, en un centro de atencin urgente o en una sala de emergencias.  Si tiene Engineering geologist, por favor llame inmediatamente al 911 o vaya a la sala de emergencias.  Nmeros de bper  - Dr. Nehemiah Massed: [phone]  - Dra. Moye: [phone]  - Dra. Nicole Kindred: [phone]  En caso de inclemencias del [place], por favor llame a Johnsie Kindred principal al [phone] para una actualizacin sobre el [place] de cualquier retraso o cierre.  Consejos para la medicacin en dermatologa: Por favor, guarde las cajas en las que vienen los medicamentos de uso tpico para ayudarle a seguir las instrucciones sobre dnde y cmo usarlos. Las farmacias generalmente imprimen las instrucciones del medicamento slo en las cajas y no directamente en los tubos del [place].   Si su medicamento es muy caro, por favor, pngase en contacto con Zigmund Daniel llamando al [phone] y presione la opcin 4 o envenos un mensaje a travs de Pharmacist, community.   No podemos decirle cul ser su copago por los medicamentos por adelantado ya que  esto es diferente dependiendo de la cobertura de su seguro. Sin embargo, es posible que podamos encontrar un medicamento sustituto a Electrical engineer un formulario para que el seguro cubra el medicamento que se considera necesario.   Si se requiere una autorizacin previa para que su compaa de seguros [place] su medicamento, por favor permtanos de 1 a 2 das hbiles para completar este proceso.  Los precios de los medicamentos varan con frecuencia dependiendo del Environmental consultant de dnde se surte la receta y alguna farmacias pueden ofrecer precios ms baratos.  El sitio web www.goodrx.com tiene cupones para medicamentos de Airline pilot. Los precios aqu no tienen en cuenta lo que podra costar con la ayuda del seguro (puede ser ms barato con su seguro), pero el sitio web puede darle el precio si no utiliz Research scientist (physical sciences).  - Puede imprimir el cupn correspondiente y llevarlo con su receta a la farmacia.  - Tambin puede pasar por nuestra oficina durante el horario de atencin regular y Charity fundraiser una tarjeta de cupones  de GoodRx.  - Si necesita que su receta se enve electrnicamente a una farmacia diferente, informe a nuestra oficina a travs de MyChart de [place] o por telfono llamando al [phone] y presione la opcin 4.

## 2022-04-21 NOTE — Patient Instructions (Addendum)
It was a pleasure meeting you today. Thank you for allowing me to take part in your health care.  Our goals for today as we discussed include:  For your diabetes Start Glargine insulin in the mornings Today take 10 units at your normal time Tomorrow take 18 units in the morning On the day of 11/8 take 28 units if Glargine insulin  Monitor you sugars.  If you need an extra insulin tonight take 5 units.  A total of 15 units tonight Tomorrow then take 13 units Then continue with the 28 units on 11/8  Schedule a fasting lab appointment in the next 2-3 weeks  Please follow-up with PCP in 2-3 months  If you have any questions or concerns, please do not hesitate to call the office at [phone].  I look forward to our next visit and until then take care and stay safe.  Regards,   Carollee Leitz, MD   [facility]

## 2022-04-21 NOTE — Progress Notes (Signed)
    SUBJECTIVE:   CHIEF COMPLAINT / HPI: transfer care  Patient presents to clinic to transfer care.  No acute concerns  DM Type 2 Asymptomatic.  Compliant with current medications and tolerating well.  Takes Glargine 28 units at night, Jardiance 25 mg daily and Metformin 1500 mg daily. Blood glucose 120's. Would like to decrease medications if possible.  HTN Asymptomatic.  Controlled without medication.  HLD Self discontinued Zetia and Crestor.  Reports muscle weakness and myalgia.  Also had not been feeling well on medication.  Previously tried Simvastatin/Atorvastatin and had similar symptoms. Does not want to restart statin. Had talked to Cardiologist who had recommended initiation of statin.   PERTINENT  PMH / PSH:  DM Type 2 HTN HLD   OBJECTIVE:   BP 118/60 (BP Location: Left Arm, Patient Position: Sitting, Cuff Size: Normal)   Pulse (!) 59   Temp 98.3 F (36.8 C) (Oral)   Ht '5\' 11"'$  (1.803 m)   Wt 188 lb 12.8 oz (85.6 kg)   SpO2 95%   BMI 26.33 kg/m    General: Alert, no acute distress Cardio: Normal S1 and S2, RRR, no r/m/g Pulm: CTAB, normal work of breathing Abdomen: Bowel sounds normal. Abdomen soft and non-tender.  Extremities: No peripheral edema.  Neuro: Cranial nerves grossly intact   ASSESSMENT/PLAN:   Type 2 diabetes mellitus without complication, with long-term current use of insulin (HCC) Chronic. Stable. Recent A1c 6.5.  Active and has modified diet to keep glucose low. Continue Metformin 1500 mg daily.   -Recommend switching Glargine to am to decrease risk of early am hypoglycemic events -Plan to wean insulin if possible in future -Continue Januvia 25 mg daily, no history of CKD/HF.  Plan to discontinue in future if not indicated. -Continue continue glucose monitoring -Continue healthy lifestyle and exercise. -A1c in 2-3 weeks for 6 month check -Follow up with results, will plan to make adjustments as appropriate and repeat A1c in 3  months   Hypertension associated with diabetes ([place]) Chronic. Stable. Controlled without medications. Goal <140/90 per JNC 8 guidelines -Could consider low dose ACEi/ARB given history of DM in future  Statin myopathy Intolerant to statins.  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  Calcium Score 583. Calcium in all 3 vessels/arteries per Cardiology.  Recommendations for goal LDL <70 and to establish care with Cardiology for continued monitoring -Discussed initiation of Repatha with patient and indicated that was too costly.   -Will reach out to Pharm team to see if can obtain financial assistance -Repeat  fasting Lipids  -Follow up with results. -If not already established with Cardiology plan to refer    HCM Foot exam at next visit [place] booster   PDMP Reviewed  Carollee Leitz, MD

## 2022-04-21 NOTE — Progress Notes (Signed)
Follow-Up Visit   Subjective  Alexis Sharp is a 76 y.o. male who presents for the following: Actinic Keratosis (Of the face and ears - patient is here today for recheck). The patient has spots, moles and lesions to be evaluated, some may be new or changing and the patient has concerns that these could be cancer. He has spot in hairline that was frozen in past, itching again.  The following portions of the chart were reviewed this encounter and updated as appropriate:       Review of Systems:  No other skin or systemic complaints except as noted in HPI or Assessment and Plan.  Objective  Well appearing patient in no apparent distress; mood and affect are within normal limits.  A focused examination was performed including the face, arms, and hands. Relevant physical exam findings are noted in the Assessment and Plan.  R temporal x 1, R zygoma x 1, R anti helix x 1, forehead x 2, L zygoma x 1 (6) Erythematous thin papules/macules with gritty scale.   R temporal hair line x 1 Erythematous stuck-on, waxy papule or plaque    Assessment & Plan  AK (actinic keratosis) (6) R temporal x 1, R zygoma x 1, R anti helix x 1, forehead x 2, L zygoma x 1  Actinic keratoses are precancerous spots that appear secondary to cumulative UV radiation exposure/sun exposure over time. They are chronic with expected duration over 1 year. A portion of actinic keratoses will progress to squamous cell carcinoma of the skin. It is not possible to reliably predict which spots will progress to skin cancer and so treatment is recommended to prevent development of skin cancer.  Recommend daily broad spectrum sunscreen SPF 30+ to sun-exposed areas, reapply every 2 hours as needed.  Recommend staying in the shade or wearing long sleeves, sun glasses (UVA+UVB protection) and wide brim hats (4-inch brim around the entire circumference of the hat). Call for new or changing lesions.   Destruction of lesion - R  temporal x 1, R zygoma x 1, R anti helix x 1, forehead x 2, L zygoma x 1  Destruction method: cryotherapy   Informed consent: discussed and consent obtained   Lesion destroyed using liquid nitrogen: Yes   Region frozen until ice ball extended beyond lesion: Yes   Outcome: patient tolerated procedure well with no complications   Post-procedure details: wound care instructions given   Additional details:  Prior to procedure, discussed risks of blister formation, small wound, skin dyspigmentation, or rare scar following cryotherapy. Recommend Vaseline ointment to treated areas while healing.   Inflamed seborrheic keratosis R temporal hair line x 1  Symptomatic, irritating, patient would like treated.  Recurrent   Destruction of lesion - R temporal hair line x 1  Destruction method: cryotherapy   Informed consent: discussed and consent obtained   Timeout:  patient name, date of birth, surgical site, and procedure verified Lesion destroyed using liquid nitrogen: Yes   Region frozen until ice ball extended beyond lesion: Yes   Outcome: patient tolerated procedure well with no complications   Post-procedure details: wound care instructions given   Additional details:  Prior to procedure, discussed risks of blister formation, small wound, skin dyspigmentation, or rare scar following cryotherapy. Recommend Vaseline ointment to treated areas while healing.    Actinic Damage - chronic, secondary to cumulative UV radiation exposure/sun exposure over time - diffuse scaly erythematous macules with underlying dyspigmentation - Recommend daily broad spectrum sunscreen SPF  30+ to sun-exposed areas, reapply every 2 hours as needed.  - Recommend staying in the shade or wearing long sleeves, sun glasses (UVA+UVB protection) and wide brim hats (4-inch brim around the entire circumference of the hat). - Call for new or changing lesions.  Seborrheic Keratoses - Stuck-on, waxy, tan-brown papules and/or  plaques  - Benign-appearing - Discussed benign etiology and prognosis. - Observe - Call for any changes  Lentigines - Scattered tan macules - Due to sun exposure - Benign-appearing, observe - Recommend daily broad spectrum sunscreen SPF 30+ to sun-exposed areas, reapply every 2 hours as needed. - Call for any changes  Return in about 6 months (around 10/20/2022) for UBSE.  Luther Redo, CMA, am acting as scribe for Brendolyn Patty, MD .  Documentation: I have reviewed the above documentation for accuracy and completeness, and I agree with the above.  Brendolyn Patty MD

## 2022-04-22 DIAGNOSIS — J324 Chronic pansinusitis: Secondary | ICD-10-CM | POA: Diagnosis not present

## 2022-05-03 ENCOUNTER — Encounter: Payer: Self-pay | Admitting: Family Medicine

## 2022-05-03 NOTE — Assessment & Plan Note (Addendum)
Intolerant to statins.  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  Calcium Score 583. Calcium in all 3 vessels/arteries per Cardiology.  Recommendations for goal LDL <70 and to establish care with Cardiology for continued monitoring -Discussed initiation of Repatha with patient and indicated that was too costly.   -Will reach out to Pharm team to see if can obtain financial assistance -Repeat  fasting Lipids  -Follow up with results. -If not already established with Cardiology plan to refer

## 2022-05-03 NOTE — Assessment & Plan Note (Signed)
Chronic. Stable. Recent A1c 6.5.  Active and has modified diet to keep glucose low. Continue Metformin 1500 mg daily.   -Recommend switching Glargine to am to decrease risk of early am hypoglycemic events -Plan to wean insulin if possible in future -Continue Januvia 25 mg daily, no history of CKD/HF.  Plan to discontinue in future if not indicated. -Continue continue glucose monitoring -Continue healthy lifestyle and exercise. -A1c in 2-3 weeks for 6 month check -Follow up with results, will plan to make adjustments as appropriate and repeat A1c in 3 months

## 2022-05-03 NOTE — Assessment & Plan Note (Signed)
Chronic. Stable. Controlled without medications. Goal <140/90 per JNC 8 guidelines -Could consider low dose ACEi/ARB given history of DM in future 

## 2022-05-06 ENCOUNTER — Other Ambulatory Visit: Payer: Self-pay | Admitting: Family Medicine

## 2022-05-06 ENCOUNTER — Other Ambulatory Visit: Payer: Self-pay

## 2022-05-06 DIAGNOSIS — E785 Hyperlipidemia, unspecified: Secondary | ICD-10-CM

## 2022-05-06 DIAGNOSIS — I700 Atherosclerosis of aorta: Secondary | ICD-10-CM

## 2022-05-06 DIAGNOSIS — T466X5A Adverse effect of antihyperlipidemic and antiarteriosclerotic drugs, initial encounter: Secondary | ICD-10-CM

## 2022-05-06 MED ORDER — REPATHA 140 MG/ML ~~LOC~~ SOSY: 140.0000 mg | PREFILLED_SYRINGE | SUBCUTANEOUS | 0 refills | Status: DC

## 2022-05-07 ENCOUNTER — Telehealth: Payer: Self-pay | Admitting: Pharmacist

## 2022-05-07 NOTE — Progress Notes (Signed)
[facility])                                            [place] Team    05/07/2022  Alexis Sharp Sep 12, 1945 [id number]  Left patient a voicemail regarding  a request for assistance with Repatha.  Will attempt another call later in the week.  Curlene Labrum, PharmD [place] Pharmacist Office: [phone]

## 2022-05-12 ENCOUNTER — Other Ambulatory Visit (INDEPENDENT_AMBULATORY_CARE_PROVIDER_SITE_OTHER): Payer: PPO

## 2022-05-12 ENCOUNTER — Telehealth: Payer: Self-pay | Admitting: Pharmacist

## 2022-05-12 DIAGNOSIS — E785 Hyperlipidemia, unspecified: Secondary | ICD-10-CM

## 2022-05-12 DIAGNOSIS — I152 Hypertension secondary to endocrine disorders: Secondary | ICD-10-CM | POA: Diagnosis not present

## 2022-05-12 DIAGNOSIS — E119 Type 2 diabetes mellitus without complications: Secondary | ICD-10-CM

## 2022-05-12 DIAGNOSIS — Z794 Long term (current) use of insulin: Secondary | ICD-10-CM | POA: Diagnosis not present

## 2022-05-12 DIAGNOSIS — E1159 Type 2 diabetes mellitus with other circulatory complications: Secondary | ICD-10-CM | POA: Diagnosis not present

## 2022-05-12 LAB — LIPID PANEL
Cholesterol: 180 mg/dL (ref 0–200)
HDL: 45.1 mg/dL (ref >39.00)
LDL Cholesterol: 122 mg/dL — ABNORMAL HIGH (ref 0–99)
NonHDL: 134.54
Total CHOL/HDL Ratio: 4
Triglycerides: 65 mg/dL (ref 0.0–149.0)
VLDL: 13 mg/dL (ref 0.0–40.0)

## 2022-05-12 LAB — COMPREHENSIVE METABOLIC PANEL
ALT: 11 U/L (ref 0–53)
AST: 13 U/L (ref 0–37)
Albumin: 4.6 g/dL (ref 3.5–5.2)
Alkaline Phosphatase: 65 U/L (ref 39–117)
BUN: 15 mg/dL (ref 6–23)
CO2: 26 mEq/L (ref 19–32)
Calcium: 9.1 mg/dL (ref 8.4–10.5)
Chloride: 102 mEq/L (ref 96–112)
Creatinine, Ser: 0.9 mg/dL (ref 0.40–1.50)
GFR: 84.08 mL/min (ref >60.00)
Glucose, Bld: 95 mg/dL (ref 70–99)
Potassium: 4 mEq/L (ref 3.5–5.1)
Sodium: 138 mEq/L (ref 135–145)
Total Bilirubin: 0.6 mg/dL (ref 0.2–1.2)
Total Protein: 6.9 g/dL (ref 6.0–8.3)

## 2022-05-12 LAB — CBC WITH DIFFERENTIAL/PLATELET
Basophils Absolute: 0.1 10*3/uL (ref 0.0–0.1)
Basophils Relative: 1 % (ref 0.0–3.0)
Eosinophils Absolute: 0.2 10*3/uL (ref 0.0–0.7)
Eosinophils Relative: 3.8 % (ref 0.0–5.0)
HCT: 47.4 % (ref 39.0–52.0)
Hemoglobin: 16 g/dL (ref 13.0–17.0)
Lymphocytes Relative: 30.4 % (ref 12.0–46.0)
Lymphs Abs: 1.8 10*3/uL (ref 0.7–4.0)
MCHC: 33.8 g/dL (ref 30.0–36.0)
MCV: 97.3 fl (ref 78.0–100.0)
Monocytes Absolute: 0.8 10*3/uL (ref 0.1–1.0)
Monocytes Relative: 13.5 % — ABNORMAL HIGH (ref 3.0–12.0)
Neutro Abs: 3 10*3/uL (ref 1.4–7.7)
Neutrophils Relative %: 51.3 % (ref 43.0–77.0)
Platelets: 249 10*3/uL (ref 150.0–400.0)
RBC: 4.87 Mil/uL (ref 4.22–5.81)
RDW: 14.1 % (ref 11.5–15.5)
WBC: 5.9 10*3/uL (ref 4.0–10.5)

## 2022-05-12 LAB — HEMOGLOBIN A1C: Hgb A1c MFr Bld: 6.6 % — ABNORMAL HIGH (ref 4.6–6.5)

## 2022-05-12 LAB — VITAMIN B12: Vitamin B-12: 238 pg/mL (ref 211–911)

## 2022-05-12 LAB — MICROALBUMIN / CREATININE URINE RATIO
Creatinine,U: 100.5 mg/dL
Microalb Creat Ratio: 0.7 mg/g (ref 0.0–30.0)
Microalb, Ur: <0.7 mg/dL (ref 0.0–1.9)

## 2022-05-12 LAB — VITAMIN D 25 HYDROXY (VIT D DEFICIENCY, FRACTURES): VITD: 54.33 ng/mL (ref 30.00–100.00)

## 2022-05-12 NOTE — Progress Notes (Signed)
[facility])                                            [place] Team    05/12/2022  Alexis Sharp 1945-09-28 [id number]  Second attempt to reach the patient regarding Medication Assistance for [place].  Curlene Labrum, PharmD [place] Pharmacist Office: [phone]

## 2022-05-13 ENCOUNTER — Telehealth: Payer: Self-pay | Admitting: Pharmacist

## 2022-05-13 NOTE — Progress Notes (Unsigned)
[facility])                                            [place] Team    05/13/2022  Alexis Sharp 1945/09/30 [id number]  Spoke with [name] and explained the options for Repatha.  Discussed both the [place] and [place].  [name] has been placed on the wait list for both.  He has my contact information if he needs further assistance.  Curlene Labrum, PharmD [place] Pharmacist Office: [phone]

## 2022-05-15 ENCOUNTER — Encounter: Payer: PPO | Admitting: Family Medicine

## 2022-06-13 ENCOUNTER — Other Ambulatory Visit: Payer: Self-pay

## 2022-06-13 DIAGNOSIS — Z794 Long term (current) use of insulin: Secondary | ICD-10-CM

## 2022-06-13 MED ORDER — LANTUS SOLOSTAR 100 UNIT/ML ~~LOC~~ SOPN: PEN_INJECTOR | SUBCUTANEOUS | 0 refills | Status: DC

## 2022-06-20 DIAGNOSIS — H401133 Primary open-angle glaucoma, bilateral, severe stage: Secondary | ICD-10-CM | POA: Diagnosis not present

## 2022-06-25 DIAGNOSIS — J324 Chronic pansinusitis: Secondary | ICD-10-CM | POA: Diagnosis not present

## 2022-07-01 DIAGNOSIS — R140 Abdominal distension (gaseous): Secondary | ICD-10-CM | POA: Diagnosis not present

## 2022-07-01 DIAGNOSIS — K5900 Constipation, unspecified: Secondary | ICD-10-CM | POA: Diagnosis not present

## 2022-07-01 DIAGNOSIS — R110 Nausea: Secondary | ICD-10-CM | POA: Diagnosis not present

## 2022-07-01 DIAGNOSIS — R5383 Other fatigue: Secondary | ICD-10-CM | POA: Diagnosis not present

## 2022-07-25 DIAGNOSIS — H401133 Primary open-angle glaucoma, bilateral, severe stage: Secondary | ICD-10-CM | POA: Diagnosis not present

## 2022-07-25 LAB — HM DIABETES EYE EXAM

## 2022-07-31 ENCOUNTER — Telehealth: Payer: Self-pay | Admitting: Family Medicine

## 2022-07-31 ENCOUNTER — Other Ambulatory Visit: Payer: Self-pay

## 2022-07-31 DIAGNOSIS — E119 Type 2 diabetes mellitus without complications: Secondary | ICD-10-CM

## 2022-07-31 MED ORDER — LANTUS SOLOSTAR 100 UNIT/ML ~~LOC~~ SOPN: PEN_INJECTOR | SUBCUTANEOUS | 0 refills | Status: DC

## 2022-07-31 NOTE — Telephone Encounter (Signed)
sent

## 2022-07-31 NOTE — Telephone Encounter (Signed)
Prescription Request  07/31/2022  Is this a "Controlled Substance" medicine? No  LOV: 04/21/2022  What is the name of the medication or equipment? LANTUS SOLOSTAR 100 UNIT/ML Solostar Pen  Have you contacted your pharmacy to request a refill? Yes   Which pharmacy would you like this sent to?  [place], [place] - [address] [place] [address] Phone: [phone] Fax: [phone]    Patient notified that their request is being sent to the clinical staff for review and that they should receive a response within 2 business days.   Please advise at Mobile [phone] (mobile)

## 2022-08-04 ENCOUNTER — Ambulatory Visit: Payer: PPO | Admitting: Family Medicine

## 2022-08-06 NOTE — Progress Notes (Signed)
SUBJECTIVE:   Chief Complaint  Patient presents with   Medical Management of Chronic Issues    Follow up on Diabetes   HPI Patient presents to clinic for follow-up diabetes.  Diabetes type 2 Asymptomatic.  Has libre sensor.  Has had some low sugars at night in 70s but not symptomatic.  Reports glucose between 90-110's. Currently takes Lantus 28 units a.m., Jardiance 25 units daily and metformin 500 mg a.m. and 1 g p.m.  Hyperlipidemia Not on statin therapy secondary to statin intolerance causing myalgias.  Stopped taking Zetia as he thought this was also a statin.  Follows with cardiology. Not currently on Repatha. Reports had not heard back from anyone for approval.   PERTINENT PMH / PSH: For lipidemia Hypertension Diabetes type 2   OBJECTIVE:  BP 130/70   Pulse (!) 53   Temp 97.8 F (36.6 C) (Oral)   Ht '5\' 11"'$  (1.803 m)   Wt 191 lb 3.2 oz (86.7 kg)   SpO2 98%   BMI 26.67 kg/m    Physical Exam Vitals reviewed.  Constitutional:      General: He is not in acute distress.    Appearance: Normal appearance. He is normal weight. He is not ill-appearing, toxic-appearing or diaphoretic.  Eyes:     General:        Right eye: No discharge.        Left eye: No discharge.  Cardiovascular:     Rate and Rhythm: Normal rate and regular rhythm.     Heart sounds: Normal heart sounds.  Pulmonary:     Effort: Pulmonary effort is normal.     Breath sounds: Normal breath sounds.  Abdominal:     General: Bowel sounds are normal.  Musculoskeletal:        General: Normal range of motion.     Cervical back: Normal range of motion.  Skin:    General: Skin is warm and dry.  Neurological:     Mental Status: He is alert and oriented to person, place, and time. Mental status is at baseline.  Psychiatric:        Mood and Affect: Mood normal.        Behavior: Behavior normal.        Thought Content: Thought content normal.        Judgment: Judgment normal.     ASSESSMENT/PLAN:   Type 2 diabetes with complication (HCC) Assessment & Plan: Chronic. Has had some low blood glucose at night  Active and has modified diet to keep glucose low. -Continue Metformin 1500 mg daily.   -Decrease Glargine from 28 units to 23 units in am.  If Blood glucose rises increase by 2 units every other day  -Continue Januvia 25 mg daily -Continue continue glucose monitoring -Continue healthy lifestyle and exercise. -A1c today     Orders: -     Hemoglobin A1c -     Vitamin B12 -     Lantus SoloStar; INJECT 23 UNITS SUBCUTANEOUSLY in morning  Dispense: 27 mL; Refill: 0  Hypertension associated with diabetes ([place]) Assessment & Plan: Chronic. Stable. Controlled without medications. Goal <140/90 per JNC 8 guidelines -Could consider low dose ACEi/ARB given history of DM in future  Orders: -     Comprehensive metabolic panel  Mixed hyperlipidemia Assessment & Plan: Self discontinued Zetia, thinking it was a statin Recommend restarting medication daily Was prescribed repatha but has not started as he has not heard if approved Follow up with Pharmacy to check  on status Repeat Lipids  Orders: -     Lipid panel -     Ezetimibe; Take 1 tablet (10 mg total) by mouth daily.  Dispense: 90 tablet; Refill: 0   PDMP reviewed  Return in about 3 months (around 11/05/2022) for PCP.  Carollee Leitz, MD

## 2022-08-06 NOTE — Patient Instructions (Addendum)
It was a pleasure meeting you today. Thank you for allowing me to take part in your health care.  Our goals for today as we discussed include:  Decrease insulin to 23 units in the morning  Restart Zetia 10 mg daily for your cholesterol  Checking A1c, cholesterol and vitamin B12 today.  Foot exam completed.  Next due February 2025  Please have recent eye exam results forwarded to PCP.  Follow up in 3 months.  If you have any questions or concerns, please do not hesitate to call the office at [phone].  I look forward to our next visit and until then take care and stay safe.  Regards,   Carollee Leitz, MD   [facility]

## 2022-08-07 ENCOUNTER — Ambulatory Visit (INDEPENDENT_AMBULATORY_CARE_PROVIDER_SITE_OTHER): Payer: PPO | Admitting: Family Medicine

## 2022-08-07 ENCOUNTER — Encounter: Payer: Self-pay | Admitting: Family Medicine

## 2022-08-07 VITALS — BP 130/70 | HR 53 | Temp 97.8°F | Ht 71.0 in | Wt 191.2 lb

## 2022-08-07 DIAGNOSIS — E782 Mixed hyperlipidemia: Secondary | ICD-10-CM

## 2022-08-07 DIAGNOSIS — I152 Hypertension secondary to endocrine disorders: Secondary | ICD-10-CM | POA: Diagnosis not present

## 2022-08-07 DIAGNOSIS — E1159 Type 2 diabetes mellitus with other circulatory complications: Secondary | ICD-10-CM

## 2022-08-07 DIAGNOSIS — E119 Type 2 diabetes mellitus without complications: Secondary | ICD-10-CM

## 2022-08-07 DIAGNOSIS — E118 Type 2 diabetes mellitus with unspecified complications: Secondary | ICD-10-CM

## 2022-08-07 LAB — HEMOGLOBIN A1C: Hgb A1c MFr Bld: 6.9 % — ABNORMAL HIGH (ref 4.6–6.5)

## 2022-08-07 LAB — LIPID PANEL
Cholesterol: 176 mg/dL (ref 0–200)
HDL: 45.2 mg/dL (ref >39.00)
LDL Cholesterol: 115 mg/dL — ABNORMAL HIGH (ref 0–99)
NonHDL: 130.36
Total CHOL/HDL Ratio: 4
Triglycerides: 79 mg/dL (ref 0.0–149.0)
VLDL: 15.8 mg/dL (ref 0.0–40.0)

## 2022-08-07 LAB — COMPREHENSIVE METABOLIC PANEL
ALT: 9 U/L (ref 0–53)
AST: 11 U/L (ref 0–37)
Albumin: 4.5 g/dL (ref 3.5–5.2)
Alkaline Phosphatase: 71 U/L (ref 39–117)
BUN: 16 mg/dL (ref 6–23)
CO2: 29 mEq/L (ref 19–32)
Calcium: 9.9 mg/dL (ref 8.4–10.5)
Chloride: 102 mEq/L (ref 96–112)
Creatinine, Ser: 0.88 mg/dL (ref 0.40–1.50)
GFR: 84.52 mL/min (ref >60.00)
Glucose, Bld: 117 mg/dL — ABNORMAL HIGH (ref 70–99)
Potassium: 4.6 mEq/L (ref 3.5–5.1)
Sodium: 140 mEq/L (ref 135–145)
Total Bilirubin: 0.7 mg/dL (ref 0.2–1.2)
Total Protein: 7 g/dL (ref 6.0–8.3)

## 2022-08-07 LAB — VITAMIN B12: Vitamin B-12: 240 pg/mL (ref 211–911)

## 2022-08-07 MED ORDER — LANTUS SOLOSTAR 100 UNIT/ML ~~LOC~~ SOPN: PEN_INJECTOR | SUBCUTANEOUS | 0 refills | Status: DC

## 2022-08-07 MED ORDER — EZETIMIBE 10 MG PO TABS: 10.0000 mg | ORAL_TABLET | Freq: Every day | ORAL | 0 refills | Status: DC

## 2022-08-09 ENCOUNTER — Encounter: Payer: Self-pay | Admitting: Family Medicine

## 2022-08-09 NOTE — Assessment & Plan Note (Signed)
Chronic. Stable. Controlled without medications. Goal <140/90 per JNC 8 guidelines -Could consider low dose ACEi/ARB given history of DM in future

## 2022-08-09 NOTE — Assessment & Plan Note (Addendum)
Chronic. Has had some low blood glucose at night  Active and has modified diet to keep glucose low. -Continue Metformin 1500 mg daily.   -Decrease Glargine from 28 units to 23 units in am.  If Blood glucose rises increase by 2 units every other day  -Continue Januvia 25 mg daily -Continue continue glucose monitoring -Continue healthy lifestyle and exercise. -A1c today

## 2022-08-09 NOTE — Assessment & Plan Note (Addendum)
Self discontinued Zetia, thinking it was a statin Recommend restarting medication daily Was prescribed repatha but has not started as he has not heard if approved Follow up with Pharmacy to check on status Repeat Lipids

## 2022-08-20 ENCOUNTER — Other Ambulatory Visit: Payer: Self-pay | Admitting: Family Medicine

## 2022-08-22 DIAGNOSIS — H401133 Primary open-angle glaucoma, bilateral, severe stage: Secondary | ICD-10-CM | POA: Diagnosis not present

## 2022-08-30 NOTE — Progress Notes (Signed)
Cardiology Office Note  Date:  09/01/2022   ID:  Alexis Sharp, DOB 10-Apr-1946, MRN [id number]  PCP:  Dana Allan, MD   Chief Complaint  Patient presents with   12 month follow up     "Doing well." Medications reviewed by the patient verbally.     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 Coronary calcium score of 583. This was 71st percentile for age and sex matched control. 2. CAC >300 in LM, LAD, LCx, RCA. CAC-DRS A3/N3.   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  PSH:    Past Surgical History:  Procedure Laterality Date   CATARACT EXTRACTION, BILATERAL     COLONOSCOPY WITH PROPOFOL N/A 09/11/2017   Procedure: COLONOSCOPY WITH PROPOFOL;  Surgeon: Scot Jun, MD;  Location: [facility] ENDOSCOPY;  Service: Endoscopy;  Laterality: N/A;   ESOPHAGOGASTRODUODENOSCOPY (EGD) WITH PROPOFOL N/A 02/02/2018   Procedure:  ESOPHAGOGASTRODUODENOSCOPY (EGD) WITH PROPOFOL;  Surgeon: Wyline Mood, MD;  Location[facility] END[facility]COPY;  Service: Gastroenterology;  Laterality: N/A;   EYE SURGERY     glaucoma left , glaucoma right x 2 Dr. Marin Shutter    HERNIA REPAIR     IMAGE GUIDED SINUS SURGERY Bilateral 02/23/2018   Procedure: IMAGE GUIDED SINUS SURGERY Right ethmoidectomy with frontal exploration bilteral maxillary antrostomies Right sphenoidectomy;  Surgeon: Geanie Logan, MD;  Location: [facility];  Service: ENT;  Laterality: Bilateral;  NEED STRYKER DISK GAVE DISK TO CECE 9-9   NASAL FRACTURE SURGERY     younger age   NASAL SINUS SURGERY     04/26/21   UMBILICAL HERNIA REPAIR  2002   XI ROBOTIC ASSISTED PARAESOPHAGEAL HERNIA REPAIR N/A 08/30/2020   Procedure: XI ROBOTIC ASSISTED PARAESOPHAGEAL HERNIA REPAIR with Sharyn Blitz, RNFA to assist;  Surgeon: Leafy Ro, MD;  Location: [facility] ORS;  Service: General;  Laterality: N/A;    Current Outpatient Medications  Medication Sig Dispense Refill   albuterol (VENTOLIN HFA) 108 (90 Base) MCG/ACT inhaler Inhale 1-2 puffs into the lungs every 6 (six) hours as needed for wheezing or shortness of breath. 18 g 11   aspirin 81 MG chewable tablet Chew by mouth.     Azelastine HCl 137 MCG/SPRAY SOLN Use 1 spray(s) in each nostril once daily 30 mL 2   empagliflozin (JARDIANCE) 25 MG TABS tablet Take 1 tablet (25 mg total) by mouth daily. 90 tablet 3   ezetimibe (ZETIA) 10 MG tablet Take 1 tablet (10 mg total) by mouth daily. 90 tablet 0   fluticasone (FLONASE) 50 MCG/ACT nasal spray Place into the nose.  LANTUS SOLOSTAR 100 UNIT/ML Solostar Pen INJECT 23 UNITS SUBCUTANEOUSLY in morning 27 mL 0   metFORMIN (GLUCOPHAGE) 500 MG tablet TAKE 1-2 TABLETS BY MOUTH SEE ADMIN INSTURCTIONS. TAKE 1 TABLET BY MOUTH IN THE MORNING WITH FOOD AND 2 TABLETS IN THE EVENING WITH FOOD. 270 tablet 3   Mometasone Furoate POWD 1.2mg /mL; add 10mL to saline; irrigate with solution  BID     Multiple Vitamin (MULTIVITAMIN) capsule Take 1 capsule by mouth daily.     omeprazole (PRILOSEC) 20 MG capsule Take 1 capsule by mouth daily.     Probiotic Product (PROBIOTIC DAILY PO) Take 1 tablet by mouth daily.     timolol (BETIMOL) 0.25 % ophthalmic solution 1-2 drops two (2) times a day.     VITAMIN D, CHOLECALCIFEROL, PO Take 1 tablet by mouth daily.     VYZULTA 0.024 % SOLN Apply 1 drop to eye daily.     Continuous Blood Gluc Sensor (FREESTYLE LIBRE 14 DAY SENSOR) MISC 1 Device by Does not apply route every 14 (fourteen) days. (Patient not taking: Reported on 09/01/2022) 2 each 11   glucose blood (ONETOUCH ULTRA) test strip USE 1 STRIP TO CHECK GLUCOSE TWICE DAILY (Patient not taking: Reported on 09/01/2022) 180 each 3   ONETOUCH DELICA LANCETS FINE MISC Use to check blood sugar once a day. Dx Code E11.9 (Patient not taking: Reported on 09/01/2022) 100 each 3   No current facility-administered medications for this visit.     Allergies:   Codeine, Pioglitazone, Sitagliptin phosphate, Atorvastatin, Other, Rosuvastatin, and Tape   Social History:  The patient  reports that he has quit smoking. He has never used smokeless tobacco. He reports that he does not drink alcohol and does not use drugs.   Family History:   family history includes Diabetes in his brother, brother, brother, mother, sister, sister, and sister; Heart disease in his brother and brother.    Review of Systems: Review of Systems  Constitutional: Negative.   HENT: Negative.    Respiratory: Negative.    Cardiovascular: Negative.   Gastrointestinal: Negative.   Musculoskeletal: Negative.   Neurological: Negative.   Psychiatric/Behavioral: Negative.    All other systems reviewed and are negative.    PHYSICAL EXAM: VS:  BP 120/80 (BP Location: Left Arm, Patient Position: Sitting, Cuff Size: Normal)   Pulse 62   Ht 5' 11.5" (1.816 m)   Wt 188 lb 6 oz (85.4 kg)   SpO2 93%   BMI 25.91 kg/m  , BMI Body  mass index is 25.91 kg/m. Constitutional:  oriented to person, place, and time. No distress.  HENT:  Head: Grossly normal Eyes:  no discharge. No scleral icterus.  Neck: No JVD, no carotid bruits  Cardiovascular: Regular rate and rhythm, no murmurs appreciated Pulmonary/Chest: Clear to auscultation bilaterally, no wheezes or rails Abdominal: Soft.  no distension.  no tenderness.  Musculoskeletal: Normal range of motion Neurological:  normal muscle tone. Coordination normal. No atrophy Skin: Skin warm and dry Psychiatric: normal affect, pleasant  Recent Labs: 05/12/2022: Hemoglobin 16.0; Platelets 249.0 08/07/2022: ALT 9; BUN 16; Creatinine, Ser 0.88; Potassium 4.6; Sodium 140    Lipid Panel Lab Results  Component Value Date   CHOL 176 08/07/2022   HDL 45.20 08/07/2022   LDLCALC 115 (H) 08/07/2022   TRIG 79.0 08/07/2022      Wt Readings from Last 3 Encounters:  09/01/22 188 lb 6 oz (85.4 kg)  08/07/22 191 lb 3.2 oz (86.7 kg)  04/21/22 188  lb 12.8 oz (85.6 kg)     ASSESSMENT AND PLAN:  Problem List Items Addressed This Visit       Cardiology Problems   Hyperlipemia     Other   Type 2 diabetes with complication (HCC)   Statin myopathy   Other Visit Diagnoses     Coronary artery calcification    -  Primary   Primary hypertension         Coronary calcification on CT scan Calcium score 500 2022 Notable calcification in all 3 vessels Statin myalgia Recommend he try Zetia 10 mg daily If numbers remain above goal, could try PCSK9 inhibitor or bempedoic acid Lifestyle modification recommended  Essential hypertension Blood pressure is well controlled on today's visit. No changes made to the medications.  Hyperlipidemia Plan as above, restart Zetia 10 daily Recheck lipids in 3 months or so If not at goal, additional medication options with bempedoic acid or PCSK9 inhibitor   Total encounter time more than 30 minutes  Greater than 50% was spent in counseling  and coordination of care with the patient    Signed, Dossie Arbour, M.D., Ph.D. [facility] [place], [place] [phone]

## 2022-09-01 ENCOUNTER — Ambulatory Visit: Payer: PPO | Attending: Cardiovascular Disease | Admitting: Cardiovascular Disease

## 2022-09-01 ENCOUNTER — Encounter: Payer: Self-pay | Admitting: Cardiovascular Disease

## 2022-09-01 VITALS — BP 120/80 | HR 62 | Ht 71.5 in | Wt 188.4 lb

## 2022-09-01 DIAGNOSIS — I2584 Coronary atherosclerosis due to calcified coronary lesion: Secondary | ICD-10-CM | POA: Diagnosis not present

## 2022-09-01 DIAGNOSIS — E782 Mixed hyperlipidemia: Secondary | ICD-10-CM

## 2022-09-01 DIAGNOSIS — E118 Type 2 diabetes mellitus with unspecified complications: Secondary | ICD-10-CM | POA: Diagnosis not present

## 2022-09-01 DIAGNOSIS — I10 Essential (primary) hypertension: Secondary | ICD-10-CM | POA: Diagnosis not present

## 2022-09-01 DIAGNOSIS — I2510 Atherosclerotic heart disease of native coronary artery without angina pectoris: Secondary | ICD-10-CM | POA: Diagnosis not present

## 2022-09-01 DIAGNOSIS — T466X5A Adverse effect of antihyperlipidemic and antiarteriosclerotic drugs, initial encounter: Secondary | ICD-10-CM | POA: Diagnosis not present

## 2022-09-01 DIAGNOSIS — G720 Drug-induced myopathy: Secondary | ICD-10-CM

## 2022-09-01 MED ORDER — EZETIMIBE 10 MG PO TABS: 10.0000 mg | ORAL_TABLET | Freq: Every day | ORAL | 3 refills | Status: DC

## 2022-09-01 NOTE — Patient Instructions (Addendum)
Goal LDL <70, preferably <60 Goal total chol <150, preferably <140  Medication Instructions:  Please continue zetia 10 mg daily  If you need a refill on your cardiac medications before your next appointment, please call your pharmacy.   Lab work: No new labs needed  Testing/Procedures: No new testing needed  Follow-Up: At [facility], you and your health needs are our priority.  As part of our continuing mission to provide you with exceptional heart care, we have created designated Provider Care Teams.  These Care Teams include your primary Cardiologist (physician) and Advanced Practice Providers (APPs -  Physician Assistants and Nurse Practitioners) who all work together to provide you with the care you need, when you need it.  You will need a follow up appointment in 12 months  Providers on your designated Care Team:   Murray Hodgkins, NP Christell Faith, PA-C Cadence Kathlen Mody, [place]  COVID-19 Vaccine Information can be found at: [email or web address] For questions related to vaccine distribution or appointments, please email [email or web address] or call [phone].

## 2022-09-08 ENCOUNTER — Other Ambulatory Visit: Payer: PPO | Admitting: Pharmacist

## 2022-09-08 ENCOUNTER — Telehealth: Payer: Self-pay | Admitting: Pharmacist

## 2022-09-08 NOTE — Progress Notes (Signed)
Attempted to contact patient for scheduled appointment for medication management. Unable to leave message for patient to return my call at their convenience.   Will send MyChart. Dr. Rockey Situ started on ezetimibe on 09/01/23   Catie Hedwig Morton, PharmD, [place], [facility] [phone]

## 2022-09-19 DIAGNOSIS — Z8669 Personal history of other diseases of the nervous system and sense organs: Secondary | ICD-10-CM | POA: Diagnosis not present

## 2022-09-19 DIAGNOSIS — B9689 Other specified bacterial agents as the cause of diseases classified elsewhere: Secondary | ICD-10-CM | POA: Diagnosis not present

## 2022-09-19 DIAGNOSIS — J329 Chronic sinusitis, unspecified: Secondary | ICD-10-CM | POA: Diagnosis not present

## 2022-09-19 DIAGNOSIS — E119 Type 2 diabetes mellitus without complications: Secondary | ICD-10-CM | POA: Diagnosis not present

## 2022-09-27 ENCOUNTER — Other Ambulatory Visit: Payer: Self-pay | Admitting: Family Medicine

## 2022-09-27 DIAGNOSIS — E118 Type 2 diabetes mellitus with unspecified complications: Secondary | ICD-10-CM

## 2022-10-03 ENCOUNTER — Telehealth: Payer: Self-pay | Admitting: Family Medicine

## 2022-10-03 NOTE — Telephone Encounter (Signed)
Pt called in 4/19 asking for an appt to see Dr. Clent Ridges. I asked what's wrong? What's going on today? He said he does not have to tell me his health issue. I was ok with that. I offered the next which is 5/21, as per pt, that does not work for him. So I offered 5/22, he's ok with that. Then pt asked what's wrong in this office because he was on hold on the phone for 25 mins, I said I just got back from lunch but we have more staff here working. Pt got a little rude with me over the phone and hang.

## 2022-10-06 ENCOUNTER — Other Ambulatory Visit: Payer: Self-pay | Admitting: *Deleted

## 2022-10-06 DIAGNOSIS — E118 Type 2 diabetes mellitus with unspecified complications: Secondary | ICD-10-CM

## 2022-10-06 MED ORDER — LANTUS SOLOSTAR 100 UNIT/ML ~~LOC~~ SOPN: PEN_INJECTOR | SUBCUTANEOUS | 6 refills | Status: AC

## 2022-10-06 NOTE — Telephone Encounter (Signed)
Received a fax request for a 90 day supply of pended medication.  Sending to PCP since no refills were given.  **Please see order instructions**

## 2022-10-13 ENCOUNTER — Other Ambulatory Visit: Payer: Self-pay

## 2022-10-13 DIAGNOSIS — E119 Type 2 diabetes mellitus without complications: Secondary | ICD-10-CM

## 2022-10-13 DIAGNOSIS — Z794 Long term (current) use of insulin: Secondary | ICD-10-CM

## 2022-10-13 MED ORDER — FREESTYLE LIBRE 14 DAY SENSOR MISC: 1.0000 | 11 refills | Status: DC

## 2022-10-20 ENCOUNTER — Encounter: Payer: Self-pay | Admitting: Dermatology

## 2022-10-20 ENCOUNTER — Ambulatory Visit: Payer: PPO | Admitting: Dermatology

## 2022-10-20 VITALS — BP 121/63 | HR 64

## 2022-10-20 DIAGNOSIS — Z86018 Personal history of other benign neoplasm: Secondary | ICD-10-CM

## 2022-10-20 DIAGNOSIS — L570 Actinic keratosis: Secondary | ICD-10-CM

## 2022-10-20 DIAGNOSIS — D229 Melanocytic nevi, unspecified: Secondary | ICD-10-CM

## 2022-10-20 DIAGNOSIS — L814 Other melanin hyperpigmentation: Secondary | ICD-10-CM | POA: Diagnosis not present

## 2022-10-20 DIAGNOSIS — L578 Other skin changes due to chronic exposure to nonionizing radiation: Secondary | ICD-10-CM | POA: Diagnosis not present

## 2022-10-20 DIAGNOSIS — L821 Other seborrheic keratosis: Secondary | ICD-10-CM | POA: Diagnosis not present

## 2022-10-20 DIAGNOSIS — Z1283 Encounter for screening for malignant neoplasm of skin: Secondary | ICD-10-CM

## 2022-10-20 DIAGNOSIS — D692 Other nonthrombocytopenic purpura: Secondary | ICD-10-CM

## 2022-10-20 DIAGNOSIS — L853 Xerosis cutis: Secondary | ICD-10-CM | POA: Diagnosis not present

## 2022-10-20 NOTE — Progress Notes (Signed)
Follow-Up Visit   Subjective  Alexis Sharp is a 77 y.o. male who presents for the following: Skin Cancer Screening and Upper Body Skin Exam Hx of aks, hx of atypical syringoma excised 2017    The patient presents for Upper Body Skin Exam (UBSE) for skin cancer screening and mole check. The patient has spots, moles and lesions to be evaluated, some may be new or changing and the patient has concerns that these could be cancer.    The following portions of the chart were reviewed this encounter and updated as appropriate: medications, allergies, medical history  Review of Systems:  No other skin or systemic complaints except as noted in HPI or Assessment and Plan.  Objective  Well appearing patient in no apparent distress; mood and affect are within normal limits.  All skin waist up examined. Relevant physical exam findings are noted in the Assessment and Plan.  right inferior vertex x 2 , left jaw preauricular x 3, right preauricular x 1 (6) Erythematous thin papules/macules with gritty scale.     Assessment & Plan   Actinic keratosis (6) right inferior vertex x 2 , left jaw preauricular x 3, right preauricular x 1  Discussed [place] PDT with debridement to treat Aks face. Patient would like more time to consider- may schedule in the fall  Actinic keratoses are precancerous spots that appear secondary to cumulative UV radiation exposure/sun exposure over time. They are chronic with expected duration over 1 year. A portion of actinic keratoses will progress to squamous cell carcinoma of the skin. It is not possible to reliably predict which spots will progress to skin cancer and so treatment is recommended to prevent development of skin cancer.  Recommend daily broad spectrum sunscreen SPF 30+ to sun-exposed areas, reapply every 2 hours as needed.  Recommend staying in the shade or wearing long sleeves, sun glasses (UVA+UVB protection) and wide brim hats (4-inch brim around  the entire circumference of the hat). Call for new or changing lesions.  Destruction of lesion - right inferior vertex x 2 , left jaw preauricular x 3, right preauricular x 1  Destruction method: cryotherapy   Informed consent: discussed and consent obtained   Lesion destroyed using liquid nitrogen: Yes   Region frozen until ice ball extended beyond lesion: Yes   Outcome: patient tolerated procedure well with no complications   Post-procedure details: wound care instructions given   Additional details:  Prior to procedure, discussed risks of blister formation, small wound, skin dyspigmentation, or rare scar following cryotherapy. Recommend Vaseline ointment to treated areas while healing.     History of Atypical Syringoma excised 2017.  Clear. Observe for recurrence. Call clinic for new or changing lesions.  Recommend regular skin exams, daily broad-spectrum spf 30+ sunscreen use, and photoprotection.    Purpura - Chronic; persistent and recurrent.  Treatable, but not curable. - Violaceous macules and patches - Benign - Related to trauma, age, sun damage and/or use of blood thinners, chronic use of topical and/or oral steroids - Observe - Can use OTC arnica containing moisturizer such as Dermend Bruise Formula if desired - Call for worsening or other concerns  Xerosis - diffuse xerotic patches - recommend gentle, hydrating skin care - gentle skin care handout given   Lentigines, Seborrheic Keratoses, Hemangiomas - Benign normal skin lesions - Benign-appearing - Call for any changes - Right vertex scalp- 1.5 cm x 1 cm indistinct light tan flat plaque no scale c/w SK - Observation  Melanocytic Nevi - Tan-brown and/or pink-flesh-colored symmetric macules and papules - Benign appearing on exam today - Observation - Call clinic for new or changing moles - Recommend daily use of broad spectrum spf 30+ sunscreen to sun-exposed areas.   ACTINIC DAMAGE WITH PRECANCEROUS ACTINIC  KERATOSES Counseling for Topical Chemotherapy Management: Patient exhibits: - Severe, confluent actinic changes with pre-cancerous actinic keratoses that is secondary to cumulative UV radiation exposure over time - Condition that is severe; chronic, not at goal. - diffuse scaly erythematous macules and papules with underlying dyspigmentation - Discussed Prescription "Field Treatment" topical Chemotherapy for Severe, Chronic Confluent Actinic Changes with Pre-Cancerous Actinic Keratoses Field treatment involves treatment of an entire area of skin that has confluent Actinic Changes (Sun/ Ultraviolet light damage) and PreCancerous Actinic Keratoses by method of PhotoDynamic Therapy (PDT) and/or prescription Topical Chemotherapy agents such as 5-fluorouracil, 5-fluorouracil/calcipotriene, and/or imiquimod.  The purpose is to decrease the number of clinically evident and subclinical PreCancerous lesions to prevent progression to development of skin cancer by chemically destroying early precancer changes that may or may not be visible.  It has been shown to reduce the risk of developing skin cancer in the treated area. As a result of treatment, redness, scaling, crusting, and open sores may occur during treatment course. One or more than one of these methods may be used and may have to be used several times to control, suppress and eliminate the PreCancerous changes. Discussed treatment course, expected reaction, and possible side effects. - Recommend daily broad spectrum sunscreen SPF 30+ to sun-exposed areas, reapply every 2 hours as needed.  - Staying in the shade or wearing long sleeves, sun glasses (UVA+UVB protection) and wide brim hats (4-inch brim around the entire circumference of the hat) are also recommended. - Call for new or changing lesions.  Discussed Red Light PDT with debridement for face.  Patient deferred at this time, would like more time to consider for fall/winter.    Skin cancer  screening performed today.  Return in about 6 months (around 04/22/2023) for ak followup.  I, Asher Muir, CMA, am acting as scribe for Willeen Niece, MD.   Documentation: I have reviewed the above documentation for accuracy and completeness, and I agree with the above.  Willeen Niece, MD

## 2022-10-20 NOTE — Patient Instructions (Addendum)
Cryotherapy Aftercare  Wash gently with soap and water everyday.   Apply Vaseline and Band-Aid daily until healed.     Actinic keratoses are precancerous spots that appear secondary to cumulative UV radiation exposure/sun exposure over time. They are chronic with expected duration over 1 year. A portion of actinic keratoses will progress to squamous cell carcinoma of the skin. It is not possible to reliably predict which spots will progress to skin cancer and so treatment is recommended to prevent development of skin cancer.  Recommend daily broad spectrum sunscreen SPF 30+ to sun-exposed areas, reapply every 2 hours as needed.  Recommend staying in the shade or wearing long sleeves, sun glasses (UVA+UVB protection) and wide brim hats (4-inch brim around the entire circumference of the hat). Call for new or changing lesions.     Seborrheic Keratosis  What causes seborrheic keratoses? Seborrheic keratoses are harmless, common skin growths that first appear during adult life.  As time goes by, more growths appear.  Some people may develop a large number of them.  Seborrheic keratoses appear on both covered and uncovered body parts.  They are not caused by sunlight.  The tendency to develop seborrheic keratoses can be inherited.  They vary in color from skin-colored to gray, brown, or even black.  They can be either smooth or have a rough, warty surface.   Seborrheic keratoses are superficial and look as if they were stuck on the skin.  Under the microscope this type of keratosis looks like layers upon layers of skin.  That is why at times the top layer may seem to fall off, but the rest of the growth remains and re-grows.    Treatment Seborrheic keratoses do not need to be treated, but can easily be removed in the office.  Seborrheic keratoses often cause symptoms when they rub on clothing or jewelry.  Lesions can be in the way of shaving.  If they become inflamed, they can cause itching,  soreness, or burning.  Removal of a seborrheic keratosis can be accomplished by freezing, burning, or surgery. If any spot bleeds, scabs, or grows rapidly, please return to have it checked, as these can be an indication of a skin cancer.     Melanoma ABCDEs  Melanoma is the most dangerous type of skin cancer, and is the leading cause of death from skin disease.  You are more likely to develop melanoma if you: Have light-colored skin, light-colored eyes, or red or blond hair Spend a lot of time in the sun Tan regularly, either outdoors or in a tanning bed Have had blistering sunburns, especially during childhood Have a close family member who has had a melanoma Have atypical moles or large birthmarks  Early detection of melanoma is key since treatment is typically straightforward and cure rates are extremely high if we catch it early.   The first sign of melanoma is often a change in a mole or a new dark spot.  The ABCDE system is a way of remembering the signs of melanoma.  A for asymmetry:  The two halves do not match. B for border:  The edges of the growth are irregular. C for color:  A mixture of colors are present instead of an even brown color. D for diameter:  Melanomas are usually (but not always) greater than 6mm - the size of a pencil eraser. E for evolution:  The spot keeps changing in size, shape, and color.  Please check your skin once per month between   visits. You can use a small mirror in front and a large mirror behind you to keep an eye on the back side or your body.   If you see any new or changing lesions before your next follow-up, please call to schedule a visit.  Please continue daily skin protection including broad spectrum sunscreen SPF 30+ to sun-exposed areas, reapplying every 2 hours as needed when you're outdoors.   Staying in the shade or wearing long sleeves, sun glasses (UVA+UVB protection) and wide brim hats (4-inch brim around the entire circumference  of the hat) are also recommended for sun protection.    Due to recent changes in healthcare laws, you may see results of your pathology and/or laboratory studies on MyChart before the doctors have had a chance to review them. We understand that in some cases there may be results that are confusing or concerning to you. Please understand that not all results are received at the same time and often the doctors may need to interpret multiple results in order to provide you with the best plan of care or course of treatment. Therefore, we ask that you please give us 2 business days to thoroughly review all your results before contacting the office for clarification. Should we see a critical lab result, you will be contacted sooner.   If You Need Anything After Your Visit  If you have any questions or concerns for your doctor, please call our main line at [phone] and press option 4 to reach your doctor's medical assistant. If no one answers, please leave a voicemail as directed and we will return your call as soon as possible. Messages left after 4 pm will be answered the following business day.   You may also send us a message via MyChart. We typically respond to MyChart messages within 1-2 business days.  For prescription refills, please ask your pharmacy to contact our office. Our fax number is [phone].  If you have an urgent issue when the clinic is closed that cannot wait until the next business day, you can page your doctor at the number below.    Please note that while we do our best to be available for urgent issues outside of office hours, we are not available 24/7.   If you have an urgent issue and are unable to reach us, you may choose to seek medical care at your doctor's office, retail clinic, urgent care center, or emergency room.  If you have a medical emergency, please immediately call 911 or go to the emergency department.  Pager Numbers  - Dr. Kowalski: [phone]  -  Dr. Moye: [phone]  - Dr. Stewart: [phone]  In the event of inclement weather, please call our main line at [phone] for an update on the status of any delays or closures.  Dermatology Medication Tips: Please keep the boxes that topical medications come in in order to help keep track of the instructions about where and how to use these. Pharmacies typically print the medication instructions only on the boxes and not directly on the medication tubes.   If your medication is too expensive, please contact our office at [phone] option 4 or send us a message through MyChart.   We are unable to tell what your co-pay for medications will be in advance as this is different depending on your insurance coverage. However, we may be able to find a substitute medication at lower cost or fill out paperwork to get insurance to cover a needed medication.     If a prior authorization is required to get your medication covered by your insurance company, please allow us 1-2 business days to complete this process.  Drug prices often vary depending on where the prescription is filled and some pharmacies may offer cheaper prices.  The website www.goodrx.com contains coupons for medications through different pharmacies. The prices here do not account for what the cost may be with help from insurance (it may be cheaper with your insurance), but the website can give you the price if you did not use any insurance.  - You can print the associated coupon and take it with your prescription to the pharmacy.  - You may also stop by our office during regular business hours and pick up a GoodRx coupon card.  - If you need your prescription sent electronically to a different pharmacy, notify our office through [facility] MyChart or by phone at [phone] option 4.     Si Usted Necesita Algo Despus de Su Visita  Tambin puede enviarnos un mensaje a travs de MyChart. Por lo general respondemos a los  mensajes de MyChart en el transcurso de 1 a 2 das hbiles.  Para renovar recetas, por favor pida a su farmacia que se ponga en contacto con nuestra oficina. Nuestro nmero de fax es el [phone].  Si tiene un asunto urgente cuando la clnica est cerrada y que no puede esperar hasta el siguiente da hbil, puede llamar/localizar a su doctor(a) al nmero que aparece a continuacin.   Por favor, tenga en cuenta que aunque hacemos todo lo posible para estar disponibles para asuntos urgentes fuera del horario de oficina, no estamos disponibles las 24 horas del da, los 7 das de la semana.   Si tiene un problema urgente y no puede comunicarse con nosotros, puede optar por buscar atencin mdica  en el consultorio de su doctor(a), en una clnica privada, en un centro de atencin urgente o en una sala de emergencias.  Si tiene una emergencia mdica, por favor llame inmediatamente al 911 o vaya a la sala de emergencias.  Nmeros de bper  - Dr. Kowalski: [phone]  - Dra. Moye: [phone]  - Dra. Stewart: [phone]  En caso de inclemencias del tiempo, por favor llame a nuestra lnea principal al [phone] para una actualizacin sobre el estado de cualquier retraso o cierre.  Consejos para la medicacin en dermatologa: Por favor, guarde las cajas en las que vienen los medicamentos de uso tpico para ayudarle a seguir las instrucciones sobre dnde y cmo usarlos. Las farmacias generalmente imprimen las instrucciones del medicamento slo en las cajas y no directamente en los tubos del medicamento.   Si su medicamento es muy caro, por favor, pngase en contacto con nuestra oficina llamando al [phone] y presione la opcin 4 o envenos un mensaje a travs de MyChart.   No podemos decirle cul ser su copago por los medicamentos por adelantado ya que esto es diferente dependiendo de la cobertura de su seguro. Sin embargo, es posible que podamos encontrar un medicamento sustituto a  menor costo o llenar un formulario para que el seguro cubra el medicamento que se considera necesario.   Si se requiere una autorizacin previa para que su compaa de seguros cubra su medicamento, por favor permtanos de 1 a 2 das hbiles para completar este proceso.  Los precios de los medicamentos varan con frecuencia dependiendo del lugar de dnde se surte la receta y alguna farmacias pueden ofrecer precios ms baratos.  El sitio web www.goodrx.com   tiene cupones para medicamentos de diferentes farmacias. Los precios aqu no tienen en cuenta lo que podra costar con la ayuda del seguro (puede ser ms barato con su seguro), pero el sitio web puede darle el precio si no utiliz ningn seguro.  - Puede imprimir el cupn correspondiente y llevarlo con su receta a la farmacia.  - Tambin puede pasar por nuestra oficina durante el horario de atencin regular y recoger una tarjeta de cupones de GoodRx.  - Si necesita que su receta se enve electrnicamente a una farmacia diferente, informe a nuestra oficina a travs de MyChart de [place] o por telfono llamando al [phone] y presione la opcin 4.  

## 2022-10-22 DIAGNOSIS — J324 Chronic pansinusitis: Secondary | ICD-10-CM | POA: Diagnosis not present

## 2022-11-04 ENCOUNTER — Ambulatory Visit: Payer: PPO | Admitting: Family Medicine

## 2022-11-05 ENCOUNTER — Ambulatory Visit: Payer: PPO | Admitting: Family Medicine

## 2022-11-21 DIAGNOSIS — H401133 Primary open-angle glaucoma, bilateral, severe stage: Secondary | ICD-10-CM | POA: Diagnosis not present

## 2022-11-26 ENCOUNTER — Other Ambulatory Visit: Payer: Self-pay | Admitting: *Deleted

## 2022-11-26 DIAGNOSIS — E119 Type 2 diabetes mellitus without complications: Secondary | ICD-10-CM

## 2022-11-26 DIAGNOSIS — E118 Type 2 diabetes mellitus with unspecified complications: Secondary | ICD-10-CM

## 2022-11-26 MED ORDER — METFORMIN HCL 500 MG PO TABS: ORAL_TABLET | ORAL | 3 refills | Status: DC

## 2022-11-26 NOTE — Telephone Encounter (Signed)
Last filled by Dr. Lonie Peak

## 2022-11-28 DIAGNOSIS — H401133 Primary open-angle glaucoma, bilateral, severe stage: Secondary | ICD-10-CM | POA: Diagnosis not present

## 2022-12-08 ENCOUNTER — Ambulatory Visit: Payer: PPO | Admitting: Family Medicine

## 2022-12-09 NOTE — Progress Notes (Signed)
SUBJECTIVE:   Chief Complaint  Patient presents with   Diabetes    Concerns with weight loss, Rx refills   HPI Patient presents to clinic for follow-up diabetes.  Diabetes type 2 Asymptomatic.  Has libre sensor.  Has had some low sugars at night in 70s but not symptomatic.  Reports glucose between 90-110's. Currently takes Lantus 28 units a.m., Jardiance 25 units daily and metformin 500 mg a.m. and 1 g p.m.  Hyperlipidemia Not on statin therapy secondary to statin intolerance causing myalgias.  Stopped taking Zetia as he thought this was also a statin.  Follows with cardiology. Not currently on Repatha. Reports had not heard back from anyone for approval.  Fatigue and weight loss. Patient reports increased fatigue and weight loss the past 2 months.  Denies any decrease in appetite, difficulty swallowing, nausea/vomiting, constipation, hematuria, hematochezia, diarrhea or constipation.  No excessive fatigue throughout the day.  Denies any history of depression or mood symptoms.  Has had recent change in diet, choosing more healthy diet and has decreased carbohydrates. Colonoscopy up-to-date, 2022, and normal.  Per GI no indication for continued colonoscopy for screening.  Patient also has history of esophageal dilation.  Last EGD 08/2020 showing mild focal narrowing consistent with a mild stricture that did not restrict the passage of a 13 mm barium tablet.   PERTINENT PMH / PSH: Hyperlipidemia Hypertension Diabetes type 2   OBJECTIVE:  BP 112/70 (BP Location: Left Arm)   Temp (!) 97.2 F (36.2 C)   Ht 5' 11.5" (1.816 m)   Wt 186 lb (84.4 kg)   SpO2 94%   BMI 25.58 kg/m    Physical Exam Vitals reviewed.  Constitutional:      General: He is not in acute distress.    Appearance: Normal appearance. He is normal weight. He is not ill-appearing, toxic-appearing or diaphoretic.  Eyes:     General:        Right eye: No discharge.        Left eye: No discharge.   Cardiovascular:     Rate and Rhythm: Normal rate and regular rhythm.     Heart sounds: Normal heart sounds.  Pulmonary:     Effort: Pulmonary effort is normal.     Breath sounds: Normal breath sounds.  Abdominal:     General: Bowel sounds are normal.  Musculoskeletal:        General: Normal range of motion.     Cervical back: Normal range of motion.  Skin:    General: Skin is warm and dry.  Neurological:     Mental Status: He is alert and oriented to person, place, and time. Mental status is at baseline.  Psychiatric:        Mood and Affect: Mood normal.        Behavior: Behavior normal.        Thought Content: Thought content normal.        Judgment: Judgment normal.       12/10/2022   11:04 AM 08/07/2022    8:34 AM 01/07/2022    8:35 AM 12/03/2021   11:23 AM 10/29/2021    7:50 AM  Depression screen PHQ 2/9  Decreased Interest 0 0 0 0 0  Down, Depressed, Hopeless 0 0 0 0 0  PHQ - 2 Score 0 0 0 0 0  Altered sleeping 0      Tired, decreased energy 0      Change in appetite 0  Feeling bad or failure about yourself  0      Trouble concentrating 0      Moving slowly or fidgety/restless 0      Suicidal thoughts 0      PHQ-9 Score 0      Difficult doing work/chores Not difficult at all           12/10/2022   11:04 AM 08/07/2022    8:34 AM  GAD 7 : Generalized Anxiety Score  Nervous, Anxious, on Edge 0 0  Control/stop worrying 0 0  Worry too much - different things 0 0  Trouble relaxing 0 0  Restless 0 0  Easily annoyed or irritable 0 0  Afraid - awful might happen 0 0  Total GAD 7 Score 0 0  Anxiety Difficulty Not difficult at all Not difficult at all      ASSESSMENT/PLAN:  Type 2 diabetes with complication [facility]) Assessment & Plan: Chronic.  Continues to have low blood glucose at night despite decrease in Lantus from twice daily to now 28 units in AM.  Continues to remain active and has diet to keep sugars low. Continue Metformin 1500 mg daily.   Continue to  decrease glargine  by 2 units every other day.  If Blood glucose rises increase by 2 units every other day.  Would like to discontinue insulin in future given hypoglycemic events at night. Continue Jardiance 25 mg daily Continue continue glucose monitoring Continue healthy lifestyle and exercise. Repeat A1c today     Orders: -     Hemoglobin A1c -     metFORMIN HCl; TAKE 1-2 TABLETS BY MOUTH SEE ADMIN INSTURCTIONS. TAKE 1 TABLET BY MOUTH IN THE MORNING WITH FOOD AND 2 TABLETS IN THE EVENING WITH FOOD.  Dispense: 270 tablet; Refill: 3 -     Empagliflozin; Take 1 tablet (25 mg total) by mouth daily.  Dispense: 90 tablet; Refill: 3  Hypertension associated with diabetes (HCC) Assessment & Plan: Chronic.  Well-controlled with her antihypertensive. Has had 4 pound weight loss since February 2024.  Diet has changed to include more healthy choices. Goal <140/90 per JNC 8 guidelines   Orders: -     Comprehensive metabolic panel  Other fatigue Assessment & Plan: Symptoms ongoing x 2 months.  Has lost 7 pounds unintentionally in 5 months.  Has noted diet has changed to include decreased carbohydrates and more healthy choices.  Colonoscopy up-to-date.  No red flags.  BMI greater than 25, and no change in appetite.  No depressed mood.  Denies SI/HI.  PHQ-9/GAD screening negative. Check CBC, TSH, CMET, vitamin D, iron studies Consider sleep studies, STOP-BANG score intermediate.  Will obtain Epworth score at next visit.  Orders: -     TSH -     CBC with Differential/Platelet -     IBC + Ferritin -     VITAMIN D 25 Hydroxy (Vit-D Deficiency, Fractures)  Gastroesophageal reflux disease, unspecified whether esophagitis present Assessment & Plan: Refill omeprazole 20 mg daily Can follow-up with GI if worsening symptoms given history of mild esophageal stricture noted on EGD  Orders: -     Omeprazole; Take 1 capsule (20 mg total) by mouth daily.  Dispense: 90 capsule; Refill:  3  Hyperlipidemia associated with type 2 diabetes mellitus (HCC) Assessment & Plan: Chronic.  Has restarted Zetia as recommended per last visit Repeat fasting lipids  Orders: -     Ezetimibe; Take 1 tablet (10 mg total) by mouth daily.  Dispense: 90  tablet; Refill: 3  Statin myopathy Assessment & Plan: Chronic.  Endorses muscle aches and pains from previous trials of different statins. Recently restarted Zetia Unable to obtain Repatha given not covered by insurance. Continue Zetia 10 mg daily Continue healthy lifestyle     PDMP reviewed  Return in about 3 months (around 03/12/2023) for PCP.  Dana Allan, MD

## 2022-12-10 ENCOUNTER — Encounter: Payer: Self-pay | Admitting: Family Medicine

## 2022-12-10 ENCOUNTER — Ambulatory Visit: Payer: PPO | Admitting: Family Medicine

## 2022-12-10 VITALS — BP 112/70 | Temp 97.2°F | Ht 71.5 in | Wt 186.0 lb

## 2022-12-10 DIAGNOSIS — G720 Drug-induced myopathy: Secondary | ICD-10-CM

## 2022-12-10 DIAGNOSIS — I152 Hypertension secondary to endocrine disorders: Secondary | ICD-10-CM | POA: Diagnosis not present

## 2022-12-10 DIAGNOSIS — E782 Mixed hyperlipidemia: Secondary | ICD-10-CM

## 2022-12-10 DIAGNOSIS — E785 Hyperlipidemia, unspecified: Secondary | ICD-10-CM

## 2022-12-10 DIAGNOSIS — E118 Type 2 diabetes mellitus with unspecified complications: Secondary | ICD-10-CM

## 2022-12-10 DIAGNOSIS — E1159 Type 2 diabetes mellitus with other circulatory complications: Secondary | ICD-10-CM | POA: Diagnosis not present

## 2022-12-10 DIAGNOSIS — E1169 Type 2 diabetes mellitus with other specified complication: Secondary | ICD-10-CM | POA: Diagnosis not present

## 2022-12-10 DIAGNOSIS — Z7984 Long term (current) use of oral hypoglycemic drugs: Secondary | ICD-10-CM | POA: Diagnosis not present

## 2022-12-10 DIAGNOSIS — T466X5A Adverse effect of antihyperlipidemic and antiarteriosclerotic drugs, initial encounter: Secondary | ICD-10-CM | POA: Diagnosis not present

## 2022-12-10 DIAGNOSIS — R5383 Other fatigue: Secondary | ICD-10-CM

## 2022-12-10 DIAGNOSIS — K219 Gastro-esophageal reflux disease without esophagitis: Secondary | ICD-10-CM | POA: Diagnosis not present

## 2022-12-10 LAB — CBC WITH DIFFERENTIAL/PLATELET
Basophils Absolute: 0.1 10*3/uL (ref 0.0–0.1)
Basophils Relative: 0.7 % (ref 0.0–3.0)
Eosinophils Absolute: 0.2 10*3/uL (ref 0.0–0.7)
Eosinophils Relative: 1.6 % (ref 0.0–5.0)
HCT: 44.9 % (ref 39.0–52.0)
Hemoglobin: 14.9 g/dL (ref 13.0–17.0)
Lymphocytes Relative: 22.3 % (ref 12.0–46.0)
Lymphs Abs: 2.1 10*3/uL (ref 0.7–4.0)
MCHC: 33.2 g/dL (ref 30.0–36.0)
MCV: 96.5 fl (ref 78.0–100.0)
Monocytes Absolute: 0.9 10*3/uL (ref 0.1–1.0)
Monocytes Relative: 9.1 % (ref 3.0–12.0)
Neutro Abs: 6.3 10*3/uL (ref 1.4–7.7)
Neutrophils Relative %: 66.3 % (ref 43.0–77.0)
Platelets: 263 10*3/uL (ref 150.0–400.0)
RBC: 4.65 Mil/uL (ref 4.22–5.81)
RDW: 13.8 % (ref 11.5–15.5)
WBC: 9.6 10*3/uL (ref 4.0–10.5)

## 2022-12-10 LAB — COMPREHENSIVE METABOLIC PANEL
ALT: 10 U/L (ref 0–53)
AST: 16 U/L (ref 0–37)
Albumin: 4.3 g/dL (ref 3.5–5.2)
Alkaline Phosphatase: 67 U/L (ref 39–117)
BUN: 17 mg/dL (ref 6–23)
CO2: 27 mEq/L (ref 19–32)
Calcium: 9.5 mg/dL (ref 8.4–10.5)
Chloride: 101 mEq/L (ref 96–112)
Creatinine, Ser: 0.85 mg/dL (ref 0.40–1.50)
GFR: 85.2 mL/min (ref >60.00)
Glucose, Bld: 105 mg/dL — ABNORMAL HIGH (ref 70–99)
Potassium: 4.4 mEq/L (ref 3.5–5.1)
Sodium: 135 mEq/L (ref 135–145)
Total Bilirubin: 0.5 mg/dL (ref 0.2–1.2)
Total Protein: 7.3 g/dL (ref 6.0–8.3)

## 2022-12-10 LAB — HEMOGLOBIN A1C: Hgb A1c MFr Bld: 6.5 % (ref 4.6–6.5)

## 2022-12-10 LAB — VITAMIN D 25 HYDROXY (VIT D DEFICIENCY, FRACTURES): VITD: 74.13 ng/mL (ref 30.00–100.00)

## 2022-12-10 LAB — IBC + FERRITIN
Ferritin: 22 ng/mL (ref 22.0–322.0)
Iron: 62 ug/dL (ref 42–165)
Saturation Ratios: 16.3 % — ABNORMAL LOW (ref 20.0–50.0)
TIBC: 379.4 ug/dL (ref 250.0–450.0)
Transferrin: 271 mg/dL (ref 212.0–360.0)

## 2022-12-10 LAB — TSH: TSH: 1.53 u[IU]/mL (ref 0.35–5.50)

## 2022-12-10 MED ORDER — OMEPRAZOLE 20 MG PO CPDR
20.0000 mg | DELAYED_RELEASE_CAPSULE | Freq: Every day | ORAL | 3 refills | Status: AC
Start: 2022-12-10 — End: ?

## 2022-12-10 MED ORDER — EMPAGLIFLOZIN 25 MG PO TABS: 25.0000 mg | ORAL_TABLET | Freq: Every day | ORAL | 3 refills | Status: DC

## 2022-12-10 MED ORDER — METFORMIN HCL 500 MG PO TABS: ORAL_TABLET | ORAL | 3 refills | Status: DC

## 2022-12-10 MED ORDER — EMPAGLIFLOZIN 25 MG PO TABS
25.0000 mg | ORAL_TABLET | Freq: Every day | ORAL | 3 refills | Status: DC
Start: 2022-12-10 — End: 2023-06-18

## 2022-12-10 MED ORDER — EZETIMIBE 10 MG PO TABS
10.0000 mg | ORAL_TABLET | Freq: Every day | ORAL | 3 refills | Status: AC
Start: 2022-12-10 — End: ?

## 2022-12-10 NOTE — Patient Instructions (Addendum)
It was a pleasure meeting you today. Thank you for allowing me to take part in your health care.  Our goals for today as we discussed include:  We will get some labs today.  If they are abnormal or we need to do something about them, I will call you.  If they are normal, I will send you a message on MyChart (if it is active) or a letter in the mail.  If you don't hear from [place] in 2 weeks, please call the office at the number below.   Decrease Lantus by 2 units every 48 hours.  Goal sugar less than 150.   If you have any glucose readings above 150 stop decreasing insulin MyChart me next week to let me know how you are doing  Your Colonoscopy was completed in 2022 and was normal.     Follow up in 3 months or sooner if needed  I value your feedback and you entrusting [place] with your care. If you get a [facility] patient survey, I would appreciate you taking the time to let us know about your experience today. Thank you!       If you have any questions or concerns, please do not hesitate to call the office at [phone].  I look forward to our next visit and until then take care and stay safe.  Regards,   Dana Allan, MD   [facility]

## 2022-12-12 ENCOUNTER — Other Ambulatory Visit: Payer: Self-pay | Admitting: Family Medicine

## 2023-01-09 ENCOUNTER — Encounter: Payer: Self-pay | Admitting: Family Medicine

## 2023-01-09 DIAGNOSIS — R5383 Other fatigue: Secondary | ICD-10-CM | POA: Insufficient documentation

## 2023-01-09 NOTE — Assessment & Plan Note (Addendum)
Chronic.  Continues to have low blood glucose at night despite decrease in Lantus from twice daily to now 28 units in AM.  Continues to remain active and has diet to keep sugars low. Continue Metformin 1500 mg daily.   Continue to decrease glargine  by 2 units every other day.  If Blood glucose rises increase by 2 units every other day.  Would like to discontinue insulin in future given hypoglycemic events at night. Continue Jardiance 25 mg daily Continue continue glucose monitoring Continue healthy lifestyle and exercise. Repeat A1c today

## 2023-01-09 NOTE — Assessment & Plan Note (Signed)
Refill omeprazole 20 mg daily Can follow-up with GI if worsening symptoms given history of mild esophageal stricture noted on EGD

## 2023-01-09 NOTE — Assessment & Plan Note (Addendum)
Symptoms ongoing x 2 months.  Has lost 7 pounds unintentionally in 5 months.  Has noted diet has changed to include decreased carbohydrates and more healthy choices.  Colonoscopy up-to-date.  No red flags.  BMI greater than 25, and no change in appetite.  No depressed mood.  Denies SI/HI.  PHQ-9/GAD screening negative. Check CBC, TSH, CMET, vitamin D, iron studies Consider sleep studies, STOP-BANG score intermediate.  Will obtain Epworth score at next visit.

## 2023-01-09 NOTE — Assessment & Plan Note (Signed)
Chronic.  Well-controlled with her antihypertensive. Has had 4 pound weight loss since February 2024.  Diet has changed to include more healthy choices. Goal <140/90 per JNC 8 guidelines

## 2023-01-09 NOTE — Assessment & Plan Note (Signed)
Chronic.  Endorses muscle aches and pains from previous trials of different statins. Recently restarted Zetia Unable to obtain Repatha given not covered by insurance. Continue Zetia 10 mg daily Continue healthy lifestyle

## 2023-01-09 NOTE — Assessment & Plan Note (Signed)
Chronic.  Has restarted Zetia as recommended per last visit Repeat fasting lipids

## 2023-01-23 DIAGNOSIS — H401133 Primary open-angle glaucoma, bilateral, severe stage: Secondary | ICD-10-CM | POA: Diagnosis not present

## 2023-01-29 ENCOUNTER — Encounter (INDEPENDENT_AMBULATORY_CARE_PROVIDER_SITE_OTHER): Payer: Self-pay

## 2023-02-21 DIAGNOSIS — B9689 Other specified bacterial agents as the cause of diseases classified elsewhere: Secondary | ICD-10-CM | POA: Diagnosis not present

## 2023-02-21 DIAGNOSIS — Z8616 Personal history of COVID-19: Secondary | ICD-10-CM | POA: Diagnosis not present

## 2023-02-21 DIAGNOSIS — J209 Acute bronchitis, unspecified: Secondary | ICD-10-CM | POA: Diagnosis not present

## 2023-02-21 DIAGNOSIS — J019 Acute sinusitis, unspecified: Secondary | ICD-10-CM | POA: Diagnosis not present

## 2023-03-16 ENCOUNTER — Encounter: Payer: Self-pay | Admitting: Family Medicine

## 2023-03-16 ENCOUNTER — Ambulatory Visit (INDEPENDENT_AMBULATORY_CARE_PROVIDER_SITE_OTHER): Payer: PPO | Admitting: Family Medicine

## 2023-03-16 VITALS — BP 120/70 | HR 57 | Temp 97.9°F | Resp 16 | Ht 71.5 in | Wt 181.4 lb

## 2023-03-16 DIAGNOSIS — M79671 Pain in right foot: Secondary | ICD-10-CM

## 2023-03-16 DIAGNOSIS — K219 Gastro-esophageal reflux disease without esophagitis: Secondary | ICD-10-CM

## 2023-03-16 DIAGNOSIS — Z9889 Other specified postprocedural states: Secondary | ICD-10-CM | POA: Diagnosis not present

## 2023-03-16 DIAGNOSIS — R5383 Other fatigue: Secondary | ICD-10-CM | POA: Diagnosis not present

## 2023-03-16 DIAGNOSIS — E1169 Type 2 diabetes mellitus with other specified complication: Secondary | ICD-10-CM

## 2023-03-16 DIAGNOSIS — E785 Hyperlipidemia, unspecified: Secondary | ICD-10-CM

## 2023-03-16 DIAGNOSIS — R252 Cramp and spasm: Secondary | ICD-10-CM | POA: Diagnosis not present

## 2023-03-16 DIAGNOSIS — M79672 Pain in left foot: Secondary | ICD-10-CM

## 2023-03-16 DIAGNOSIS — Z8719 Personal history of other diseases of the digestive system: Secondary | ICD-10-CM | POA: Diagnosis not present

## 2023-03-16 DIAGNOSIS — Z7984 Long term (current) use of oral hypoglycemic drugs: Secondary | ICD-10-CM

## 2023-03-16 DIAGNOSIS — E118 Type 2 diabetes mellitus with unspecified complications: Secondary | ICD-10-CM

## 2023-03-16 LAB — COMPREHENSIVE METABOLIC PANEL
ALT: 10 U/L (ref 0–53)
AST: 12 U/L (ref 0–37)
Albumin: 4.3 g/dL (ref 3.5–5.2)
Alkaline Phosphatase: 65 U/L (ref 39–117)
BUN: 13 mg/dL (ref 6–23)
CO2: 29 meq/L (ref 19–32)
Calcium: 9 mg/dL (ref 8.4–10.5)
Chloride: 101 meq/L (ref 96–112)
Creatinine, Ser: 0.81 mg/dL (ref 0.40–1.50)
GFR: 86.29 mL/min (ref >60.00)
Glucose, Bld: 143 mg/dL — ABNORMAL HIGH (ref 70–99)
Potassium: 4.5 meq/L (ref 3.5–5.1)
Sodium: 138 meq/L (ref 135–145)
Total Bilirubin: 0.6 mg/dL (ref 0.2–1.2)
Total Protein: 6.6 g/dL (ref 6.0–8.3)

## 2023-03-16 LAB — CBC WITH DIFFERENTIAL/PLATELET
Basophils Absolute: 0 10*3/uL (ref 0.0–0.1)
Basophils Relative: 0.7 % (ref 0.0–3.0)
Eosinophils Absolute: 0.2 10*3/uL (ref 0.0–0.7)
Eosinophils Relative: 2.9 % (ref 0.0–5.0)
HCT: 45.9 % (ref 39.0–52.0)
Hemoglobin: 14.9 g/dL (ref 13.0–17.0)
Lymphocytes Relative: 24.7 % (ref 12.0–46.0)
Lymphs Abs: 1.7 10*3/uL (ref 0.7–4.0)
MCHC: 32.6 g/dL (ref 30.0–36.0)
MCV: 98.5 fL (ref 78.0–100.0)
Monocytes Absolute: 0.7 10*3/uL (ref 0.1–1.0)
Monocytes Relative: 9.9 % (ref 3.0–12.0)
Neutro Abs: 4.4 10*3/uL (ref 1.4–7.7)
Neutrophils Relative %: 61.8 % (ref 43.0–77.0)
Platelets: 239 10*3/uL (ref 150.0–400.0)
RBC: 4.66 Mil/uL (ref 4.22–5.81)
RDW: 14.8 % (ref 11.5–15.5)
WBC: 7.1 10*3/uL (ref 4.0–10.5)

## 2023-03-16 LAB — POCT GLYCOSYLATED HEMOGLOBIN (HGB A1C): Hemoglobin A1C: 7.7 % — AB (ref 4.0–5.6)

## 2023-03-16 MED ORDER — METFORMIN HCL 500 MG PO TABS: ORAL_TABLET | ORAL | Status: AC

## 2023-03-16 NOTE — Patient Instructions (Addendum)
It was a pleasure meeting you today. Thank you for allowing me to take part in your health care.  Our goals for today as we discussed include:  A1c 7.7 Continue current Lantus 10 units daily Decrease Metformin to 500 mg two times a day  Follow up with Podiatry  Recommend Flu vaccine Recommend Covid vaccine   Schedule Medicare Annual Wellness Visit   If you have any questions or concerns, please do not hesitate to call the office at [phone].  I look forward to our next visit and until then take care and stay safe.  Regards,   Dana Allan, MD   [facility]

## 2023-03-16 NOTE — Progress Notes (Signed)
SUBJECTIVE:   Chief Complaint  Patient presents with   Diabetes   HPI Patient presents to clinic for follow-up diabetes.  Discussed the use of AI scribe software for clinical note transcription with the patient, who gave verbal consent to proceed.  History of Present Illness   The patient, a 77 year old with a history of diabetes, presented with two primary concerns. Firstly, they reported experiencing significant discomfort in their feet, to the point of difficulty walking. The pain was described as extending up the calf of one leg, with a particularly tender area across the foot, reminiscent of plantar fasciitis. The patient noted that the discomfort was not consistent, with some days being more severe than others. They also reported that the use of orthotics, which they had recently started wearing again, seemed to alleviate the discomfort somewhat.  The second concern was related to gastrointestinal issues. The patient reported experiencing excessive gas and sought a referral to a gastroenterologist for a second opinion. They expressed dissatisfaction with the current treatment plan, which primarily involved over-the-counter remedies and dietary modifications.  In terms of their diabetes management, the patient reported a recent increase in their HbA1c to 7.7. They had previously reduced their insulin dosage from 23 units to 10 units due to feelings of fatigue. However, they noticed an increase in their blood sugar levels and decided to restart the insulin about a week prior to the consultation. They also reported ongoing lethargy, despite the adjustment in their diabetes management.  The patient also mentioned a history of hernia repair surgery, after which they started experiencing the current gastrointestinal symptoms. They expressed a desire to have these symptoms re-evaluated, hence the request for a referral to a new gastroenterologist.    PERTINENT PMH /  PSH: Hyperlipidemia Hypertension Diabetes type 2   OBJECTIVE:  BP 120/70   Pulse (!) 57   Temp 97.9 F (36.6 C)   Resp 16   Ht 5' 11.5" (1.816 m)   Wt 181 lb 6 oz (82.3 kg)   SpO2 97%   BMI 24.94 kg/m    Physical Exam Constitutional:      Appearance: He is normal weight.  Cardiovascular:     Rate and Rhythm: Normal rate.  Pulmonary:     Effort: Pulmonary effort is normal.  Neurological:     General: No focal deficit present.     Mental Status: He is alert and oriented to person, place, and time. Mental status is at baseline.   EXTREMITIES: No edema. MUSCULOSKELETAL: Tenderness on palpation of the plantar aspect of the foot, particularly around the medial aspect. No tenderness on palpation of the calf. NEUROLOGICAL: No numbness or tingling in the feet.      03/16/2023    8:19 AM 12/10/2022   11:04 AM 08/07/2022    8:34 AM 01/07/2022    8:35 AM 12/03/2021   11:23 AM  Depression screen PHQ 2/9  Decreased Interest 0 0 0 0 0  Down, Depressed, Hopeless 0 0 0 0 0  PHQ - 2 Score 0 0 0 0 0  Altered sleeping 0 0     Tired, decreased energy 0 0     Change in appetite 0 0     Feeling bad or failure about yourself  0 0     Trouble concentrating 0 0     Moving slowly or fidgety/restless 0 0     Suicidal thoughts 0 0     PHQ-9 Score 0 0     Difficult  doing work/chores Not difficult at all Not difficult at all          03/16/2023    8:19 AM 12/10/2022   11:04 AM 08/07/2022    8:34 AM  GAD 7 : Generalized Anxiety Score  Nervous, Anxious, on Edge 0 0 0  Control/stop worrying 0 0 0  Worry too much - different things 0 0 0  Trouble relaxing 0 0 0  Restless 0 0 0  Easily annoyed or irritable 0 0 0  Afraid - awful might happen 0 0 0  Total GAD 7 Score 0 0 0  Anxiety Difficulty Not difficult at all Not difficult at all Not difficult at all      ASSESSMENT/PLAN:  Type 2 diabetes with complication [facility]) Assessment & Plan: A1c has increased to 7.7. Patient had decreased  insulin dose from 23 units to 10 units due to feeling unwell. Patient also stopped insulin for a month and then restarted at 10 units due to high blood sugars. -Continue Lantus 10 units daily. -Reduce Metformin to 500mg  twice daily. -Continue Jardiance 25 mg daily -Check blood glucose regularly and report any hypoglycemic symptoms.  Orders: -     POCT glycosylated hemoglobin (Hb A1C) -     metFORMIN HCl; TAKE 1 TABLET BY MOUTH IN THE MORNING WITH FOOD AND 1 TABLET IN THE EVENING WITH FOOD  Leg cramps -     Comprehensive metabolic panel -     CBC with Differential/Platelet  Gastroesophageal reflux disease, unspecified whether esophagitis present -     Ambulatory referral to Gastroenterology  S/P repair of paraesophageal hernia Assessment & Plan: Multiple GI concerns Patient reports excessive gas and dissatisfaction with current GI care. Patient has a history of hiatal hernia repair. -Refer to Dr. Elnoria Howard in [place] for a second opinion and further management.  Orders: -     Ambulatory referral to Gastroenterology  Hyperlipidemia associated with type 2 diabetes mellitus (HCC) Assessment & Plan: Asymptomatic.  A1c 7.7 Restart Lantus 10 units daily Continue Jardiance 25 mg daily Decrease Metformin to 500 mg BID given weight loss Follow up in     Foot pain, bilateral Assessment & Plan: Suspected plantar fasciitis with pain in the foot and calf. Patient has orthotics but has not been using them consistently. Patient reports cramping and has been drinking Pedialyte for suspected dehydration. -Order blood work to check electrolyte levels. -Follow up with podiatry for further evaluation and management. -Advise patient to use orthotics regularly.  Orders: -     Comprehensive metabolic panel -     CBC with Differential/Platelet  Other fatigue Assessment & Plan: Patient reports ongoing fatigue and lethargy. Previous blood work was normal. Patient denies snoring or sleep issues  but does report daytime sleepiness and napping. Recent labs reassuring.  Intermediate STOP Bang -Refer for sleep study to rule out sleep apnea.   Orders: -     Ambulatory referral to Pulmonology   PDMP reviewed  Return in 6 months (on 09/13/2023), or if symptoms worsen or fail to improve, for PCP.  Dana Allan, MD

## 2023-03-18 DIAGNOSIS — J329 Chronic sinusitis, unspecified: Secondary | ICD-10-CM | POA: Diagnosis not present

## 2023-03-22 ENCOUNTER — Encounter: Payer: Self-pay | Admitting: Family Medicine

## 2023-03-22 DIAGNOSIS — M79671 Pain in right foot: Secondary | ICD-10-CM | POA: Insufficient documentation

## 2023-03-22 DIAGNOSIS — R252 Cramp and spasm: Secondary | ICD-10-CM | POA: Insufficient documentation

## 2023-03-22 NOTE — Assessment & Plan Note (Signed)
Patient reports ongoing fatigue and lethargy. Previous blood work was normal. Patient denies snoring or sleep issues but does report daytime sleepiness and napping. Recent labs reassuring.  Intermediate STOP Bang -Refer for sleep study to rule out sleep apnea.

## 2023-03-22 NOTE — Assessment & Plan Note (Signed)
Asymptomatic.  A1c 7.7 Restart Lantus 10 units daily Continue Jardiance 25 mg daily Decrease Metformin to 500 mg BID given weight loss Follow up in

## 2023-03-22 NOTE — Assessment & Plan Note (Signed)
A1c has increased to 7.7. Patient had decreased insulin dose from 23 units to 10 units due to feeling unwell. Patient also stopped insulin for a month and then restarted at 10 units due to high blood sugars. -Continue Lantus 10 units daily. -Reduce Metformin to 500mg  twice daily. -Continue Jardiance 25 mg daily -Check blood glucose regularly and report any hypoglycemic symptoms.

## 2023-03-22 NOTE — Assessment & Plan Note (Signed)
Multiple GI concerns Patient reports excessive gas and dissatisfaction with current GI care. Patient has a history of hiatal hernia repair. -Refer to Dr. Elnoria Howard in [place] for a second opinion and further management.

## 2023-03-22 NOTE — Assessment & Plan Note (Addendum)
Suspected plantar fasciitis with pain in the foot and calf. Patient has orthotics but has not been using them consistently. Patient reports cramping and has been drinking Pedialyte for suspected dehydration. -Order blood work to check electrolyte levels. -Follow up with podiatry for further evaluation and management. -Advise patient to use orthotics regularly.

## 2023-03-23 ENCOUNTER — Encounter: Payer: Self-pay | Admitting: Family Medicine

## 2023-03-23 DIAGNOSIS — M79671 Pain in right foot: Secondary | ICD-10-CM | POA: Diagnosis not present

## 2023-03-23 DIAGNOSIS — E119 Type 2 diabetes mellitus without complications: Secondary | ICD-10-CM | POA: Diagnosis not present

## 2023-03-23 DIAGNOSIS — M2041 Other hammer toe(s) (acquired), right foot: Secondary | ICD-10-CM | POA: Diagnosis not present

## 2023-03-23 DIAGNOSIS — M2042 Other hammer toe(s) (acquired), left foot: Secondary | ICD-10-CM | POA: Diagnosis not present

## 2023-03-23 DIAGNOSIS — M79672 Pain in left foot: Secondary | ICD-10-CM | POA: Diagnosis not present

## 2023-03-23 DIAGNOSIS — M7741 Metatarsalgia, right foot: Secondary | ICD-10-CM | POA: Diagnosis not present

## 2023-03-23 DIAGNOSIS — S99921A Unspecified injury of right foot, initial encounter: Secondary | ICD-10-CM | POA: Diagnosis not present

## 2023-03-23 DIAGNOSIS — M7742 Metatarsalgia, left foot: Secondary | ICD-10-CM | POA: Diagnosis not present

## 2023-03-23 DIAGNOSIS — I739 Peripheral vascular disease, unspecified: Secondary | ICD-10-CM | POA: Diagnosis not present

## 2023-03-23 DIAGNOSIS — L603 Nail dystrophy: Secondary | ICD-10-CM | POA: Diagnosis not present

## 2023-03-24 ENCOUNTER — Encounter: Payer: Self-pay | Admitting: Internal Medicine

## 2023-04-05 ENCOUNTER — Encounter: Payer: Self-pay | Admitting: Family Medicine

## 2023-04-06 ENCOUNTER — Institutional Professional Consult (permissible substitution): Payer: PPO | Admitting: Internal Medicine

## 2023-04-07 ENCOUNTER — Telehealth: Payer: Self-pay | Admitting: Family Medicine

## 2023-04-07 NOTE — Telephone Encounter (Signed)
Dr. Elnoria Howard was not able to see patient today.  They wanted to let you know.

## 2023-04-11 ENCOUNTER — Encounter: Payer: Self-pay | Admitting: Family Medicine

## 2023-04-15 ENCOUNTER — Other Ambulatory Visit (INDEPENDENT_AMBULATORY_CARE_PROVIDER_SITE_OTHER): Payer: PPO

## 2023-04-15 ENCOUNTER — Other Ambulatory Visit (INDEPENDENT_AMBULATORY_CARE_PROVIDER_SITE_OTHER): Payer: Self-pay | Admitting: Podiatry

## 2023-04-15 DIAGNOSIS — I739 Peripheral vascular disease, unspecified: Secondary | ICD-10-CM

## 2023-04-30 ENCOUNTER — Institutional Professional Consult (permissible substitution): Payer: PPO | Admitting: Internal Medicine

## 2023-04-30 DIAGNOSIS — E119 Type 2 diabetes mellitus without complications: Secondary | ICD-10-CM | POA: Diagnosis not present

## 2023-05-11 ENCOUNTER — Ambulatory Visit: Payer: PPO | Admitting: Dermatology

## 2023-05-11 ENCOUNTER — Encounter: Payer: Self-pay | Admitting: Dermatology

## 2023-05-11 DIAGNOSIS — L738 Other specified follicular disorders: Secondary | ICD-10-CM | POA: Diagnosis not present

## 2023-05-11 DIAGNOSIS — L578 Other skin changes due to chronic exposure to nonionizing radiation: Secondary | ICD-10-CM | POA: Diagnosis not present

## 2023-05-11 DIAGNOSIS — L814 Other melanin hyperpigmentation: Secondary | ICD-10-CM | POA: Diagnosis not present

## 2023-05-11 DIAGNOSIS — D692 Other nonthrombocytopenic purpura: Secondary | ICD-10-CM | POA: Diagnosis not present

## 2023-05-11 DIAGNOSIS — W908XXA Exposure to other nonionizing radiation, initial encounter: Secondary | ICD-10-CM

## 2023-05-11 DIAGNOSIS — L570 Actinic keratosis: Secondary | ICD-10-CM

## 2023-05-11 DIAGNOSIS — L821 Other seborrheic keratosis: Secondary | ICD-10-CM

## 2023-05-11 NOTE — Progress Notes (Signed)
Follow-Up Visit   Subjective  Alexis Sharp is a 77 y.o. male who presents for the following: AK 56m f/u face, scalp The patient has spots, moles and lesions to be evaluated, some may be new or changing and the patient may have concern these could be cancer.   The following portions of the chart were reviewed this encounter and updated as appropriate: medications, allergies, medical history  Review of Systems:  No other skin or systemic complaints except as noted in HPI or Assessment and Plan.  Objective  Well appearing patient in no apparent distress; mood and affect are within normal limits.   A focused examination was performed of the following areas: Face, scalp, ears, hands  Relevant exam findings are noted in the Assessment and Plan.  L lat forehead x 3, L frontal hairline x 2, L temple x 1, L preauricular x 1, R frontal scalp x 3, R temple x 1, R cheek x 1, R sideburn x 1, L ear mid helix x 1, R malar cheek x 1 (15) Pink scaly macules    Assessment & Plan   ACTINIC DAMAGE - chronic, secondary to cumulative UV radiation exposure/sun exposure over time - diffuse scaly erythematous macules with underlying dyspigmentation - Recommend daily broad spectrum sunscreen SPF 30+ to sun-exposed areas, reapply every 2 hours as needed.  - Recommend staying in the shade or wearing long sleeves, sun glasses (UVA+UVB protection) and wide brim hats (4-inch brim around the entire circumference of the hat). - Call for new or changing lesions.   AK (actinic keratosis) (15) L lat forehead x 3, L frontal hairline x 2, L temple x 1, L preauricular x 1, R frontal scalp x 3, R temple x 1, R cheek x 1, R sideburn x 1, L ear mid helix x 1, R malar cheek x 1  Actinic keratoses are precancerous spots that appear secondary to cumulative UV radiation exposure/sun exposure over time. They are chronic with expected duration over 1 year. A portion of actinic keratoses will progress to squamous cell  carcinoma of the skin. It is not possible to reliably predict which spots will progress to skin cancer and so treatment is recommended to prevent development of skin cancer.  Recommend daily broad spectrum sunscreen SPF 30+ to sun-exposed areas, reapply every 2 hours as needed.  Recommend staying in the shade or wearing long sleeves, sun glasses (UVA+UVB protection) and wide brim hats (4-inch brim around the entire circumference of the hat). Call for new or changing lesions.  Destruction of lesion - L lat forehead x 3, L frontal hairline x 2, L temple x 1, L preauricular x 1, R frontal scalp x 3, R temple x 1, R cheek x 1, R sideburn x 1, L ear mid helix x 1, R malar cheek x 1 (15)  Destruction method: cryotherapy   Informed consent: discussed and consent obtained   Lesion destroyed using liquid nitrogen: Yes   Region frozen until ice ball extended beyond lesion: Yes   Outcome: patient tolerated procedure well with no complications   Post-procedure details: wound care instructions given   Additional details:  Prior to procedure, discussed risks of blister formation, small wound, skin dyspigmentation, or rare scar following cryotherapy. Recommend Vaseline ointment to treated areas while healing.   SEBORRHEIC KERATOSIS - Stuck-on, waxy, tan-brown papules and/or plaques  - Benign-appearing - Discussed benign etiology and prognosis. - Observe - Call for any changes - R vertex scalp, face  LENTIGINES Exam:  scattered tan macules Due to sun exposure Treatment Plan: Benign-appearing, observe. Recommend daily broad spectrum sunscreen SPF 30+ to sun-exposed areas, reapply every 2 hours as needed.  Call for any changes  Sebaceous Hyperplasia - Small yellow papules with a central dell - Benign-appearing - Observe. Call for changes.   Purpura - Chronic; persistent and recurrent.  Treatable, but not curable. - Violaceous macules and patches - Benign - Related to trauma, age, sun damage and/or  use of blood thinners, chronic use of topical and/or oral steroids - Observe - Can use OTC arnica containing moisturizer such as Dermend Bruise Formula if desired - Call for worsening or other concerns   Return in about 6 months (around 11/08/2023) for UBSE, Hx of AKs, hx of atypical syringoma.  I, Ardis Rowan, RMA, am acting as scribe for Willeen Niece, MD .   Documentation: I have reviewed the above documentation for accuracy and completeness, and I agree with the above.  Willeen Niece, MD

## 2023-05-11 NOTE — Patient Instructions (Addendum)
Cryotherapy Aftercare  Wash gently with soap and water everyday.   Apply Vaseline and Band-Aid daily until healed.    Due to recent changes in healthcare laws, you may see results of your pathology and/or laboratory studies on MyChart before the doctors have had a chance to review them. We understand that in some cases there may be results that are confusing or concerning to you. Please understand that not all results are received at the same time and often the doctors may need to interpret multiple results in order to provide you with the best plan of care or course of treatment. Therefore, we ask that you please give [place] 2 business days to thoroughly review all your results before contacting the office for clarification. Should we see a critical lab result, you will be contacted sooner.   If You Need Anything After Your Visit  If you have any questions or concerns for your doctor, please call our main line at [phone] and press option 4 to reach your doctor's medical assistant. If no one answers, please leave a voicemail as directed and we will return your call as soon as possible. Messages left after 4 pm will be answered the following business day.   You may also send [place] a message via MyChart. We typically respond to MyChart messages within 1-2 business days.  For prescription refills, please ask your pharmacy to contact our office. Our fax number is [phone].  If you have an urgent issue when the clinic is closed that cannot wait until the next business day, you can page your doctor at the number below.    Please note that while we do our best to be available for urgent issues outside of office hours, we are not available 24/7.   If you have an urgent issue and are unable to reach [place], you may choose to seek medical care at your doctor's office, retail clinic, urgent care center, or emergency room.  If you have a medical emergency, please immediately call 911 or go to the emergency  department.  Pager Numbers  - Dr. Gwen Pounds: [phone]  - Dr. Roseanne Reno: [phone]  - Dr. Katrinka Blazing: [phone]   In the event of inclement weather, please call our main line at [phone] for an update on the status of any delays or closures.  Dermatology Medication Tips: Please keep the boxes that topical medications come in in order to help keep track of the instructions about where and how to use these. Pharmacies typically print the medication instructions only on the boxes and not directly on the medication tubes.   If your medication is too expensive, please contact our office at [phone] option 4 or send [place] a message through MyChart.   We are unable to tell what your co-pay for medications will be in advance as this is different depending on your insurance coverage. However, we may be able to find a substitute medication at lower cost or fill out paperwork to get insurance to cover a needed medication.   If a prior authorization is required to get your medication covered by your insurance company, please allow [place] 1-2 business days to complete this process.  Drug prices often vary depending on where the prescription is filled and some pharmacies may offer cheaper prices.  The website www.goodrx.com contains coupons for medications through different pharmacies. The prices here do not account for what the cost may be with help from insurance (it may be cheaper with your insurance), but the website can  give you the price if you did not use any insurance.  - You can print the associated coupon and take it with your prescription to the pharmacy.  - You may also stop by our office during regular business hours and pick up a GoodRx coupon card.  - If you need your prescription sent electronically to a different pharmacy, notify our office through [facility] or by phone at [phone] option 4.     Si Usted Necesita Algo Despus de Su Visita  Tambin puede enviarnos  un mensaje a travs de Clinical cytogeneticist. Por lo general respondemos a los mensajes de MyChart en el transcurso de 1 a 2 das hbiles.  Para renovar recetas, por favor pida a su farmacia que se ponga en contacto con nuestra oficina. Annie Sable de fax es [place] [phone].  Si tiene un asunto urgente cuando la clnica est cerrada y que no puede esperar hasta el siguiente da hbil, puede llamar/localizar a su doctor(a) al nmero que aparece a continuacin.   Por favor, tenga en cuenta que aunque hacemos todo lo posible para estar disponibles para asuntos urgentes fuera del horario de [place], no estamos disponibles las 24 horas del da, los 7 [address]  [address] de la [address].   Si tiene un problema urgente y no puede comunicarse con nosotros, puede optar por buscar atencin mdica  en el consultorio de su doctor(a), en una clnica privada, en un centro de atencin urgente o en una sala de emergencias.  Si tiene Engineer, drilling, por favor llame inmediatamente al 911 o vaya a la sala de emergencias.  Nmeros de bper  - Dr. Gwen Pounds: [phone]  - Dra. Roseanne Reno: [phone]  - Dr. Katrinka Blazing: [phone]   En caso de inclemencias del tiempo, por favor llame a Lacy Duverney principal al [phone] para una actualizacin sobre el [place] de cualquier retraso o cierre.  Consejos para la medicacin en dermatologa: Por favor, guarde las cajas en las que vienen los medicamentos de uso tpico para ayudarle a seguir las instrucciones sobre dnde y cmo usarlos. Las farmacias generalmente imprimen las instrucciones del medicamento slo en las cajas y no directamente en los tubos del [place].   Si su medicamento es muy caro, por favor, pngase en contacto con Rolm Gala llamando al [phone] y presione la opcin 4 o envenos un mensaje a travs de Clinical cytogeneticist.   No podemos decirle cul ser su copago por los medicamentos por adelantado ya que esto es diferente dependiendo de la cobertura de su seguro. Sin  embargo, es posible que podamos encontrar un medicamento sustituto a Audiological scientist un formulario para que el seguro cubra el medicamento que se considera necesario.   Si se requiere una autorizacin previa para que su compaa de seguros [place] su medicamento, por favor permtanos de 1 a 2 das hbiles para completar [address]  Los precios de los medicamentos varan con frecuencia dependiendo del Environmental consultant de dnde se surte la receta y alguna farmacias pueden ofrecer precios ms baratos.  El sitio web www.goodrx.com tiene cupones para medicamentos de Health and safety inspector. Los precios aqu no tienen en cuenta lo que podra costar con la ayuda del seguro (puede ser ms barato con su seguro), pero el sitio web puede darle el precio si no utiliz Tourist information centre manager.  - Puede imprimir el cupn correspondiente y llevarlo con su receta a la farmacia.  - Tambin puede pasar por nuestra oficina durante el horario de atencin regular y Education officer, museum una tarjeta de cupones de GoodRx.  -  Si necesita que su receta se enve electrnicamente a Psychiatrist, informe a nuestra oficina a travs de MyChart de [place] o por telfono llamando al [phone] y presione la opcin 4.

## 2023-05-22 DIAGNOSIS — H401133 Primary open-angle glaucoma, bilateral, severe stage: Secondary | ICD-10-CM | POA: Diagnosis not present

## 2023-05-29 DIAGNOSIS — H40043 Steroid responder, bilateral: Secondary | ICD-10-CM | POA: Diagnosis not present

## 2023-05-29 DIAGNOSIS — H401133 Primary open-angle glaucoma, bilateral, severe stage: Secondary | ICD-10-CM | POA: Diagnosis not present

## 2023-06-15 ENCOUNTER — Telehealth: Payer: Self-pay

## 2023-06-15 NOTE — Telephone Encounter (Signed)
Called and advised him that PA for diabetic shoes and insert  was approved.

## 2023-06-18 ENCOUNTER — Other Ambulatory Visit: Payer: Self-pay

## 2023-06-18 ENCOUNTER — Telehealth: Payer: Self-pay | Admitting: Family Medicine

## 2023-06-18 DIAGNOSIS — E118 Type 2 diabetes mellitus with unspecified complications: Secondary | ICD-10-CM

## 2023-06-18 DIAGNOSIS — H5989 Other postprocedural complications and disorders of eye and adnexa, not elsewhere classified: Secondary | ICD-10-CM | POA: Diagnosis not present

## 2023-06-18 DIAGNOSIS — H18231 Secondary corneal edema, right eye: Secondary | ICD-10-CM | POA: Diagnosis not present

## 2023-06-18 DIAGNOSIS — Z947 Corneal transplant status: Secondary | ICD-10-CM | POA: Diagnosis not present

## 2023-06-18 MED ORDER — EMPAGLIFLOZIN 25 MG PO TABS
25.0000 mg | ORAL_TABLET | Freq: Every day | ORAL | 3 refills | Status: DC
Start: 2023-06-18 — End: 2023-06-18

## 2023-06-18 MED ORDER — EMPAGLIFLOZIN 25 MG PO TABS: 25.0000 mg | ORAL_TABLET | Freq: Every day | ORAL | 3 refills | Status: DC

## 2023-06-18 NOTE — Telephone Encounter (Signed)
 Patient walked into office. He is requesting a written prescription for  empagliflozin (JARDIANCE) 25 MG TABS tablet. Please call patient when provider processes.

## 2023-06-18 NOTE — Addendum Note (Signed)
 Addended by: Kristie Cowman on: 06/18/2023 04:03 PM   Modules accepted: Orders

## 2023-06-19 ENCOUNTER — Other Ambulatory Visit: Payer: Self-pay | Admitting: Family Medicine

## 2023-06-19 ENCOUNTER — Telehealth: Payer: Self-pay

## 2023-06-19 DIAGNOSIS — E118 Type 2 diabetes mellitus with unspecified complications: Secondary | ICD-10-CM

## 2023-06-19 NOTE — Telephone Encounter (Signed)
 Copied from CRM [id number]. Topic: General - Other >> Jun 19, 2023  2:32 PM Corin V wrote: Reason for CRM: Patient called in stating he is on his way to the office and he would like the script for his Jardiance  Rx printed and waiting at the front desk for him.

## 2023-06-19 NOTE — Telephone Encounter (Signed)
 Copied from CRM [id number]. Topic: Clinical - Medication Refill >> Jun 19, 2023  2:19 PM Viola FALCON wrote: Most Recent Primary Care Visit:  Provider: WALSH, TANYA  Department: [facility]  Visit Type: OFFICE VISIT  Date: 03/16/2023  Medication: empagliflozin    Has the patient contacted their pharmacy?  (Agent: If no, request that the patient contact the pharmacy for the refill. If patient does not wish to contact the pharmacy document the reason why and proceed with request.) (Agent: If yes, when and what did the pharmacy advise?)  Is this the correct pharmacy for this prescription?  If no, delete pharmacy and type the correct one.  This is the patient's preferred pharmacy:    [facility] [address] [place] - [address] [place] [address] Phone: [phone] Fax: [phone]   Has the prescription been filled recently?   Is the patient out of the medication?   Has the patient been seen for an appointment in the last year OR does the patient have an upcoming appointment?   Can we respond through MyChart?   Agent: Please be advised that Rx refills may take up to 3 business days. We ask that you follow-up with your pharmacy.

## 2023-06-19 NOTE — Telephone Encounter (Signed)
 Copied from CRM [id number]. Topic: Clinical - Medication Refill >> Jun 19, 2023  2:19 PM Viola FALCON wrote: Most Recent Primary Care Visit:  Provider: WALSH, TANYA  Department: [facility]  Visit Type: OFFICE VISIT  Date: 03/16/2023  Medication: Empagliflozin  (JARDIANCE )  Has the patient contacted their pharmacy? Yes, [facility] called to confirm that we received fax - it looks like the status of this med is discontinued? Please call [facility] at [phone] regarding [phone]   (Agent: If no, request that the patient contact the pharmacy for the refill. If patient does not wish to contact the pharmacy document the reason why and proceed with request.) (Agent: If yes, when and what did the pharmacy advise?)  Is this the correct pharmacy for this prescription? Yes If no, delete pharmacy and type the correct one.  This is the patient's preferred pharmacy:    Please call [facility] at [phone] regarding [phone] They faxed request over yesterday 06/18/23   Has the prescription been filled recently? Yes  Is the patient out of the medication? Yes  Has the patient been seen for an appointment in the last year OR does the patient have an upcoming appointment? Yes  Can we respond through MyChart? Yes  Agent: Please be advised that Rx refills may take up to 3 business days. We ask that you follow-up with your pharmacy.

## 2023-06-19 NOTE — Telephone Encounter (Signed)
 Rx is printed just waiting on provider to sign the rx request. Will call pt when it is ready. Pt gave a sample of Jardiance today in office.

## 2023-06-19 NOTE — Telephone Encounter (Signed)
 Dr. Hope pt given 1 week of samples.  Needs Printed and signed paper prescription to be picked up Monday.  Pt gets medications from [facility].    Medication Samples have been provided to the patient.  Drug name: Jardiance        Strength: 25mg         Qty: 7  LOT: [id number]  Exp.Date: 06/2025  Dosing instructions: Take 1 tablet daily  The patient has been instructed regarding the correct time, dose, and frequency of taking this medication, including desired effects and most common side effects.   Alexis Sharp 2:59 PM 06/19/2023

## 2023-06-22 ENCOUNTER — Encounter: Payer: Self-pay | Admitting: *Deleted

## 2023-06-22 MED ORDER — EMPAGLIFLOZIN 25 MG PO TABS: 25.0000 mg | ORAL_TABLET | Freq: Every day | ORAL | 3 refills | Status: AC

## 2023-06-22 NOTE — Telephone Encounter (Signed)
 See other note also. Sent mychart message  Good afternoon,   Dr Hope is out of the office again today. So I reprinted your Rx & had Dr Verneita Kettering sign it. It has been faxed over witht he form from [facility] to: [phone].   Please let me know if you need anything else or if ou would like to pickup a copy or would like it mailed.   Thanks,   Sharene ORN, CMA

## 2023-06-22 NOTE — Telephone Encounter (Signed)
 Provider out again today. Spoke with Dr Darrick Huntsman and she has agreed to sign it. Reprinting it for her to sign.

## 2023-06-22 NOTE — Addendum Note (Signed)
 Addended by: Warden Fillers on: 06/22/2023 12:39 PM   Modules accepted: Orders

## 2023-06-24 DIAGNOSIS — J329 Chronic sinusitis, unspecified: Secondary | ICD-10-CM | POA: Diagnosis not present

## 2023-06-26 NOTE — Telephone Encounter (Signed)
 Pt walked in and requested copy. States that e in entitled to a copy.  I informed pt the I messaged him on Monday asking to let us  know if he wanted a copy. A copy was placed up front for pickup  Nobody called patient to let him know. I also informed him that walk ins will have to wait until we can handle them.

## 2023-06-29 NOTE — Telephone Encounter (Signed)
 Called pt to see if he was able to get his RX script that he requested.

## 2023-06-30 NOTE — Telephone Encounter (Signed)
 Spoke to wife and she stated that pt was able to pick up Rx from the office.

## 2023-07-24 DIAGNOSIS — E119 Type 2 diabetes mellitus without complications: Secondary | ICD-10-CM | POA: Diagnosis not present

## 2023-07-24 DIAGNOSIS — H40043 Steroid responder, bilateral: Secondary | ICD-10-CM | POA: Diagnosis not present

## 2023-07-27 ENCOUNTER — Ambulatory Visit: Payer: PPO | Admitting: Dermatology

## 2023-07-27 DIAGNOSIS — L820 Inflamed seborrheic keratosis: Secondary | ICD-10-CM | POA: Diagnosis not present

## 2023-07-27 DIAGNOSIS — W908XXA Exposure to other nonionizing radiation, initial encounter: Secondary | ICD-10-CM | POA: Diagnosis not present

## 2023-07-27 DIAGNOSIS — L578 Other skin changes due to chronic exposure to nonionizing radiation: Secondary | ICD-10-CM

## 2023-07-27 DIAGNOSIS — L821 Other seborrheic keratosis: Secondary | ICD-10-CM | POA: Diagnosis not present

## 2023-07-27 NOTE — Patient Instructions (Addendum)

## 2023-07-27 NOTE — Progress Notes (Signed)
   Follow-Up Visit   Subjective  Alexis Sharp is a 78 y.o. male who presents for the following: spot on R temple he would like looked, pt just noticed spot, itchy, flaky.   The patient has spots, moles and lesions to be evaluated, some may be new or changing and the patient may have concern these could be cancer.   The following portions of the chart were reviewed this encounter and updated as appropriate: medications, allergies, medical history  Review of Systems:  No other skin or systemic complaints except as noted in HPI or Assessment and Plan.  Objective  Well appearing patient in no apparent distress; mood and affect are within normal limits.   A focused examination was performed of the following areas: Face  Relevant exam findings are noted in the Assessment and Plan.  R temporal scalp at hairline x1 Waxy pink stuck on plaque  Assessment & Plan   ACTINIC DAMAGE - chronic, secondary to cumulative UV radiation exposure/sun exposure over time - diffuse scaly erythematous macules with underlying dyspigmentation - Recommend daily broad spectrum sunscreen SPF 30+ to sun-exposed areas, reapply every 2 hours as needed.  - Recommend staying in the shade or wearing long sleeves, sun glasses (UVA+UVB protection) and wide brim hats (4-inch brim around the entire circumference of the hat). - Call for new or changing lesions.   SEBORRHEIC KERATOSIS - Stuck-on, waxy, tan-brown papules and/or plaques  - Benign-appearing - Discussed benign etiology and prognosis. - Observe - Call for any changes  INFLAMED SEBORRHEIC KERATOSIS R temporal scalp at hairline x1 Symptomatic, irritating, patient would like treated.  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   Destruction of lesion - R temporal scalp at hairline x1  Destruction method: cryotherapy   Informed consent: discussed and consent obtained    Lesion destroyed using liquid nitrogen: Yes   Region frozen until ice ball extended beyond lesion: Yes   Outcome: patient tolerated procedure well with no complications   Post-procedure details: wound care instructions given   Additional details:  Prior to procedure, discussed risks of blister formation, small wound, skin dyspigmentation, or rare scar following cryotherapy. Recommend Vaseline ointment to treated areas while healing.   Return for As scheduled, TBSE, recheck ISK.  I, Jacquelynn Vera, CMA, am acting as scribe for Artemio Larry, MD .   Documentation: I have reviewed the above documentation for accuracy and completeness, and I agree with the above.  Artemio Larry, MD

## 2023-09-18 DIAGNOSIS — L603 Nail dystrophy: Secondary | ICD-10-CM | POA: Diagnosis not present

## 2023-09-18 DIAGNOSIS — M79672 Pain in left foot: Secondary | ICD-10-CM | POA: Diagnosis not present

## 2023-09-18 DIAGNOSIS — M2042 Other hammer toe(s) (acquired), left foot: Secondary | ICD-10-CM | POA: Diagnosis not present

## 2023-09-18 DIAGNOSIS — Q6672 Congenital pes cavus, left foot: Secondary | ICD-10-CM | POA: Diagnosis not present

## 2023-09-18 DIAGNOSIS — M2041 Other hammer toe(s) (acquired), right foot: Secondary | ICD-10-CM | POA: Diagnosis not present

## 2023-09-18 DIAGNOSIS — M7741 Metatarsalgia, right foot: Secondary | ICD-10-CM | POA: Diagnosis not present

## 2023-09-18 DIAGNOSIS — E1142 Type 2 diabetes mellitus with diabetic polyneuropathy: Secondary | ICD-10-CM | POA: Diagnosis not present

## 2023-09-18 DIAGNOSIS — Q6671 Congenital pes cavus, right foot: Secondary | ICD-10-CM | POA: Diagnosis not present

## 2023-09-18 DIAGNOSIS — M79671 Pain in right foot: Secondary | ICD-10-CM | POA: Diagnosis not present

## 2023-09-18 DIAGNOSIS — M7742 Metatarsalgia, left foot: Secondary | ICD-10-CM | POA: Diagnosis not present

## 2023-09-23 DIAGNOSIS — J324 Chronic pansinusitis: Secondary | ICD-10-CM | POA: Diagnosis not present

## 2023-09-29 DIAGNOSIS — M79671 Pain in right foot: Secondary | ICD-10-CM | POA: Diagnosis not present

## 2023-09-29 DIAGNOSIS — Z794 Long term (current) use of insulin: Secondary | ICD-10-CM | POA: Diagnosis not present

## 2023-09-29 DIAGNOSIS — K2100 Gastro-esophageal reflux disease with esophagitis, without bleeding: Secondary | ICD-10-CM | POA: Diagnosis not present

## 2023-09-29 DIAGNOSIS — Z130 Encounter for screening for diseases of the blood and blood-forming organs and certain disorders involving the immune mechanism: Secondary | ICD-10-CM | POA: Diagnosis not present

## 2023-09-29 DIAGNOSIS — J324 Chronic pansinusitis: Secondary | ICD-10-CM | POA: Diagnosis not present

## 2023-09-29 DIAGNOSIS — Z125 Encounter for screening for malignant neoplasm of prostate: Secondary | ICD-10-CM | POA: Diagnosis not present

## 2023-09-29 DIAGNOSIS — E781 Pure hyperglyceridemia: Secondary | ICD-10-CM | POA: Diagnosis not present

## 2023-09-29 DIAGNOSIS — M79672 Pain in left foot: Secondary | ICD-10-CM | POA: Diagnosis not present

## 2023-09-29 DIAGNOSIS — L821 Other seborrheic keratosis: Secondary | ICD-10-CM | POA: Diagnosis not present

## 2023-09-29 DIAGNOSIS — E119 Type 2 diabetes mellitus without complications: Secondary | ICD-10-CM | POA: Diagnosis not present

## 2023-10-21 DIAGNOSIS — Z947 Corneal transplant status: Secondary | ICD-10-CM | POA: Diagnosis not present

## 2023-10-21 DIAGNOSIS — H1820 Unspecified corneal edema: Secondary | ICD-10-CM | POA: Diagnosis not present

## 2023-10-21 DIAGNOSIS — H409 Unspecified glaucoma: Secondary | ICD-10-CM | POA: Diagnosis not present

## 2023-10-21 DIAGNOSIS — D3130 Benign neoplasm of unspecified choroid: Secondary | ICD-10-CM | POA: Diagnosis not present

## 2023-10-26 ENCOUNTER — Ambulatory Visit: Payer: PPO | Admitting: Dermatology

## 2023-10-26 DIAGNOSIS — D1801 Hemangioma of skin and subcutaneous tissue: Secondary | ICD-10-CM

## 2023-10-26 DIAGNOSIS — L578 Other skin changes due to chronic exposure to nonionizing radiation: Secondary | ICD-10-CM | POA: Diagnosis not present

## 2023-10-26 DIAGNOSIS — Z86018 Personal history of other benign neoplasm: Secondary | ICD-10-CM | POA: Diagnosis not present

## 2023-10-26 DIAGNOSIS — Z7189 Other specified counseling: Secondary | ICD-10-CM | POA: Diagnosis not present

## 2023-10-26 DIAGNOSIS — L820 Inflamed seborrheic keratosis: Secondary | ICD-10-CM

## 2023-10-26 DIAGNOSIS — L309 Dermatitis, unspecified: Secondary | ICD-10-CM | POA: Diagnosis not present

## 2023-10-26 DIAGNOSIS — W908XXA Exposure to other nonionizing radiation, initial encounter: Secondary | ICD-10-CM

## 2023-10-26 DIAGNOSIS — L570 Actinic keratosis: Secondary | ICD-10-CM | POA: Diagnosis not present

## 2023-10-26 DIAGNOSIS — D692 Other nonthrombocytopenic purpura: Secondary | ICD-10-CM

## 2023-10-26 DIAGNOSIS — Z1283 Encounter for screening for malignant neoplasm of skin: Secondary | ICD-10-CM | POA: Diagnosis not present

## 2023-10-26 DIAGNOSIS — L821 Other seborrheic keratosis: Secondary | ICD-10-CM

## 2023-10-26 DIAGNOSIS — L814 Other melanin hyperpigmentation: Secondary | ICD-10-CM | POA: Diagnosis not present

## 2023-10-26 DIAGNOSIS — D229 Melanocytic nevi, unspecified: Secondary | ICD-10-CM

## 2023-10-26 NOTE — Patient Instructions (Addendum)
 Photodynamic Therapy- "Blue or Red Light Therapy"  Actinic keratoses are the dry, red scaly spots on the skin caused by sun damage. A portion of these spots can turn into skin cancer with time, and treating them can help prevent development of skin cancer.   Treatment of these spots requires removal of the defective skin cells. There are various ways to remove actinic keratoses, including freezing with liquid nitrogen, treatment with creams, or treatment with a blue light procedure in the office.   Photodynamic Therapy (PDT), also known as "blue or red light therapy" is an in office procedure used to treat actinic keratoses. It works by targeting precancerous cells. After treatment, these cells peel off and are replaced by healthy ones.   For your phototherapy appointment, you will have two appointments on the day of your treatment. The first appointment will be to apply a cream to the treatment area. You will leave this cream on for 1-2 hours depending on the area being treated. The second appointment will be to shine a blue or red light on the area for 16-20 minutes to kill off the precancer cells. It is common to experience a burning sensation during the treatment.  After your treatment, it will be important to keep the treated areas of skin out of the sun completely for 48-72 hours (2-3 days) to prevent having a reaction.   Common side effects include: - Burning or stinging, which may be severe and can last up to 24-72 hours after your treatment - Scaling and crusting which may last up to 2 weeks - Redness, swelling and/or peeling which can last up to 4 weeks  To Care for Your Skin After PDT/Blue/Red Light Therapy: - Wash with soap, water and shampoo as normal. - If needed, you can use cold compresses (e.g. ice packs) for comfort - If okay with your primary care doctor, you may use analgesics such as acetaminophen  (tylenol ) every 4-6 hours, not to exceed recommended dose - You may apply  Cerave Healing Ointment, Vaseline or Aquaphor as needed - If you have a lot of swelling you may take a Benadryl  to help with this (this may cause drowsiness), not to exceed recommended dose. This may increase the risk of falls in people over 65 and may slow reaction time while driving, so it is not recommended to take before driving or operating machinery. - Sun Precautions - Wear a wide brim hat for the next week if outside  - Wear a sunblock with zinc or titanium dioxide at least SPF 50 daily  If you have any questions or concerns, please call the office and ask to speak with a nurse.   --------------------------------------------------------------------------------------------------------------    Seborrheic Keratosis  What causes seborrheic keratoses? Seborrheic keratoses are harmless, common skin growths that first appear during adult life.  As time goes by, more growths appear.  Some people may develop a large number of them.  Seborrheic keratoses appear on both covered and uncovered body parts.  They are not caused by sunlight.  The tendency to develop seborrheic keratoses can be inherited.  They vary in color from skin-colored to gray, brown, or even black.  They can be either smooth or have a rough, warty surface.   Seborrheic keratoses are superficial and look as if they were stuck on the skin.  Under the microscope this type of keratosis looks like layers upon layers of skin.  That is why at times the top layer may seem to fall off, but the  rest of the growth remains and re-grows.    Treatment Seborrheic keratoses do not need to be treated, but can easily be removed in the office.  Seborrheic keratoses often cause symptoms when they rub on clothing or jewelry.  Lesions can be in the way of shaving.  If they become inflamed, they can cause itching, soreness, or burning.  Removal of a seborrheic keratosis can be accomplished by freezing, burning, or surgery. If any spot bleeds, scabs, or  grows rapidly, please return to have it checked, as these can be an indication of a skin cancer.  Cryotherapy Aftercare  Wash gently with soap and water everyday.   Apply Vaseline and Band-Aid daily until healed.   Due to recent changes in healthcare laws, you may see results of your pathology and/or laboratory studies on MyChart before the doctors have had a chance to review them. We understand that in some cases there may be results that are confusing or concerning to you. Please understand that not all results are received at the same time and often the doctors may need to interpret multiple results in order to provide you with the best plan of care or course of treatment. Therefore, we ask that you please give us  2 business days to thoroughly review all your results before contacting the office for clarification. Should we see a critical lab result, you will be contacted sooner.   If You Need Anything After Your Visit  If you have any questions or concerns for your doctor, please call our main line at [phone] and press option 4 to reach your doctor's medical assistant. If no one answers, please leave a voicemail as directed and we will return your call as soon as possible. Messages left after 4 pm will be answered the following business day.   You may also send us  a message via MyChart. We typically respond to MyChart messages within 1-2 business days.  For prescription refills, please ask your pharmacy to contact our office. Our fax number is [phone].  If you have an urgent issue when the clinic is closed that cannot wait until the next business day, you can page your doctor at the number below.    Please note that while we do our best to be available for urgent issues outside of office hours, we are not available 24/7.   If you have an urgent issue and are unable to reach us , you may choose to seek medical care at your doctor's office, retail clinic, urgent care center, or  emergency room.  If you have a medical emergency, please immediately call 911 or go to the emergency department.  Pager Numbers  - Dr. Bary Likes: [phone]  - Dr. Annette Barters: [phone]  - Dr. Felipe Horton: [phone]   In the event of inclement weather, please call our main line at [phone] for an update on the status of any delays or closures.  Dermatology Medication Tips: Please keep the boxes that topical medications come in in order to help keep track of the instructions about where and how to use these. Pharmacies typically print the medication instructions only on the boxes and not directly on the medication tubes.   If your medication is too expensive, please contact our office at [phone] option 4 or send us  a message through MyChart.   We are unable to tell what your co-pay for medications will be in advance as this is different depending on your insurance coverage. However, we may be able to find a substitute  medication at lower cost or fill out paperwork to get insurance to cover a needed medication.   If a prior authorization is required to get your medication covered by your insurance company, please allow us  1-2 business days to complete this process.  Drug prices often vary depending on where the prescription is filled and some pharmacies may offer cheaper prices.  The website www.goodrx.com contains coupons for medications through different pharmacies. The prices here do not account for what the cost may be with help from insurance (it may be cheaper with your insurance), but the website can give you the price if you did not use any insurance.  - You can print the associated coupon and take it with your prescription to the pharmacy.  - You may also stop by our office during regular business hours and pick up a GoodRx coupon card.  - If you need your prescription sent electronically to a different pharmacy, notify our office through [facility] or by phone at  [phone] option 4.     Si Usted Necesita Algo Despus de Su Visita  Tambin puede enviarnos un mensaje a travs de Clinical cytogeneticist. Por lo general respondemos a los mensajes de MyChart en el transcurso de 1 a 2 das hbiles.  Para renovar recetas, por favor pida a su farmacia que se ponga en contacto con nuestra oficina. Franz Jacks de fax es [place] [phone].  Si tiene un asunto urgente cuando la clnica est cerrada y que no puede esperar hasta el siguiente da hbil, puede llamar/localizar a su doctor(a) al nmero que aparece a continuacin.   Por favor, tenga en cuenta que aunque hacemos todo lo posible para estar disponibles para asuntos urgentes fuera del horario de [place], no estamos disponibles las 24 horas del da, los 7 [address]  [address] de la [place].   Si tiene un problema urgente y no puede comunicarse con nosotros, puede optar por buscar atencin mdica  en el consultorio de su doctor(a), en una clnica privada, en un centro de atencin urgente o en una sala de emergencias.  Si tiene Engineer, drilling, por favor llame inmediatamente al 911 o vaya a la sala de emergencias.  Nmeros de bper  - Dr. Bary Likes: [phone]  - Dra. Annette Barters: [phone]  - Dr. Felipe Horton: [phone]   En caso de inclemencias del tiempo, por favor llame a Lajuan Pila principal al [phone] para una actualizacin sobre el [place] de cualquier retraso o cierre.  Consejos para la medicacin en dermatologa: Por favor, guarde las cajas en las que vienen los medicamentos de uso tpico para ayudarle a seguir las instrucciones sobre dnde y cmo usarlos. Las farmacias generalmente imprimen las instrucciones del medicamento slo en las cajas y no directamente en los tubos del [place].   Si su medicamento es muy caro, por favor, pngase en contacto con Bettyjane Brunet llamando al [phone] y presione la opcin 4 o envenos un mensaje a travs de Clinical cytogeneticist.   No podemos decirle cul ser su copago por los  medicamentos por adelantado ya que esto es diferente dependiendo de la cobertura de su seguro. Sin embargo, es posible que podamos encontrar un medicamento sustituto a Audiological scientist un formulario para que el seguro cubra el medicamento que se considera necesario.   Si se requiere una autorizacin previa para que su compaa de seguros [place] su medicamento, por favor permtanos de 1 a 2 das hbiles para completar este proceso.  Los precios de los medicamentos varan con frecuencia dependiendo del Environmental consultant  de dnde se surte la receta y alguna farmacias pueden ofrecer precios ms baratos.  El sitio web www.goodrx.com tiene cupones para medicamentos de Health and safety inspector. Los precios aqu no tienen en cuenta lo que podra costar con la ayuda del seguro (puede ser ms barato con su seguro), pero el sitio web puede darle el precio si no utiliz Tourist information centre manager.  - Puede imprimir el cupn correspondiente y llevarlo con su receta a la farmacia.  - Tambin puede pasar por nuestra oficina durante el horario de atencin regular y Education officer, museum una tarjeta de cupones de GoodRx.  - Si necesita que su receta se enve electrnicamente a una farmacia diferente, informe a nuestra oficina a travs de MyChart de [place] o por telfono llamando al [phone] y presione la opcin 4.

## 2023-10-26 NOTE — Progress Notes (Signed)
 Follow-Up Visit   Subjective  Alexis Sharp is a 78 y.o. male who presents for the following: Skin Cancer Screening and Full Body Skin Exam hx of Aks, knot scalp, not sure how long its been there, seems bigger and is bothersome.  The patient presents for Total-Body Skin Exam (TBSE) for skin cancer screening and mole check. The patient has spots, moles and lesions to be evaluated, some may be new or changing and the patient may have concern these could be cancer.    The following portions of the chart were reviewed this encounter and updated as appropriate: medications, allergies, medical history  Review of Systems:  No other skin or systemic complaints except as noted in HPI or Assessment and Plan.  Objective  Well appearing patient in no apparent distress; mood and affect are within normal limits.  A full examination was performed including scalp, head, eyes, ears, nose, lips, neck, chest, axillae, abdomen, back, buttocks, bilateral upper extremities, bilateral lower extremities, hands, feet, fingers, toes, fingernails, and toenails. All findings within normal limits unless otherwise noted below.   Relevant physical exam findings are noted in the Assessment and Plan.  R post parietal scalp at vertex x 1 Stuck on waxy plaque with erythema forehead x 4, L upper eyebrow x 2, L preauricular x 1 (7) Pink scaly macules  Assessment & Plan   SKIN CANCER SCREENING PERFORMED TODAY.  LENTIGINES, SEBORRHEIC KERATOSES, HEMANGIOMAS - Benign normal skin lesions - Benign-appearing - Call for any changes  MELANOCYTIC NEVI - Tan-brown and/or pink-flesh-colored symmetric macules and papules - Benign appearing on exam today - Observation - Call clinic for new or changing moles - Recommend daily use of broad spectrum spf 30+ sunscreen to sun-exposed areas.   Purpura - Chronic; persistent and recurrent.  Treatable, but not curable. - Violaceous macules and patches - Benign - Related to  trauma, age, sun damage and/or use of blood thinners, chronic use of topical and/or oral steroids - Observe - Can use OTC arnica containing moisturizer such as Dermend Bruise Formula if desired - Call for worsening or other concerns  HISTORY OF ATYPICAL SYRINGOMA Mid sternum Exam: clear  Treatment Plan: Observe for recurrence  Dermatitis, irritant vs nummular R neck Exam: Pink scaly patch R neck  Treatment Plan: PRN itch start otc HC cream, recheck on f/u  INFLAMED SEBORRHEIC KERATOSIS R post parietal scalp at vertex x 1 Symptomatic, irritating, patient would like treated.  May take additional treatments to clear due to size. Destruction of lesion - R post parietal scalp at vertex x 1  Destruction method: cryotherapy   Informed consent: discussed and consent obtained   Lesion destroyed using liquid nitrogen: Yes   Region frozen until ice ball extended beyond lesion: Yes   Outcome: patient tolerated procedure well with no complications   Post-procedure details: wound care instructions given   Additional details:  Prior to procedure, discussed risks of blister formation, small wound, skin dyspigmentation, or rare scar following cryotherapy. Recommend Vaseline ointment to treated areas while healing.  AK (ACTINIC KERATOSIS) (7) forehead x 4, L upper eyebrow x 2, L preauricular x 1 (7) Actinic keratoses are precancerous spots that appear secondary to cumulative UV radiation exposure/sun exposure over time. They are chronic with expected duration over 1 year. A portion of actinic keratoses will progress to squamous cell carcinoma of the skin. It is not possible to reliably predict which spots will progress to skin cancer and so treatment is recommended to prevent development of  skin cancer.  Recommend daily broad spectrum sunscreen SPF 30+ to sun-exposed areas, reapply every 2 hours as needed.  Recommend staying in the shade or wearing long sleeves, sun glasses (UVA+UVB protection)  and wide brim hats (4-inch brim around the entire circumference of the hat). Call for new or changing lesions. Destruction of lesion - forehead x 4, L upper eyebrow x 2, L preauricular x 1 (7)  Destruction method: cryotherapy   Informed consent: discussed and consent obtained   Lesion destroyed using liquid nitrogen: Yes   Region frozen until ice ball extended beyond lesion: Yes   Outcome: patient tolerated procedure well with no complications   Post-procedure details: wound care instructions given   Additional details:  Prior to procedure, discussed risks of blister formation, small wound, skin dyspigmentation, or rare scar following cryotherapy. Recommend Vaseline ointment to treated areas while healing.   ACTINIC DAMAGE WITH PRECANCEROUS ACTINIC KERATOSES Counseling for Topical Chemotherapy Management: Patient exhibits: - Severe, confluent actinic changes with pre-cancerous actinic keratoses that is secondary to cumulative UV radiation exposure over time - Condition that is severe; chronic, not at goal. - diffuse scaly erythematous macules and papules with underlying dyspigmentation - Discussed Prescription "Field Treatment" topical Chemotherapy for Severe, Chronic Confluent Actinic Changes with Pre-Cancerous Actinic Keratoses Field treatment involves treatment of an entire area of skin that has confluent Actinic Changes (Sun/ Ultraviolet light damage) and PreCancerous Actinic Keratoses by method of PhotoDynamic Therapy (PDT) and/or prescription Topical Chemotherapy agents such as 5-fluorouracil, 5-fluorouracil/calcipotriene, and/or imiquimod.  The purpose is to decrease the number of clinically evident and subclinical PreCancerous lesions to prevent progression to development of skin cancer by chemically destroying early precancer changes that may or may not be visible.  It has been shown to reduce the risk of developing skin cancer in the treated area. As a result of treatment, redness,  scaling, crusting, and open sores may occur during treatment course. One or more than one of these methods may be used and may have to be used several times to control, suppress and eliminate the PreCancerous changes. Discussed treatment course, expected reaction, and possible side effects. - Recommend daily broad spectrum sunscreen SPF 30+ to sun-exposed areas, reapply every 2 hours as needed.  - Staying in the shade or wearing long sleeves, sun glasses (UVA+UVB protection) and wide brim hats (4-inch brim around the entire circumference of the hat) are also recommended. - Call for new or changing lesions.  Recommend Red light PDT treatment with debridement to face  Return for Red light to face with debridement, 38m AK f/u, recheck Dermatitis R neck.  I, Rollie Clipper, RMA, am acting as scribe for Artemio Larry, MD .   Documentation: I have reviewed the above documentation for accuracy and completeness, and I agree with the above.  Artemio Larry, MD

## 2023-11-03 ENCOUNTER — Encounter (INDEPENDENT_AMBULATORY_CARE_PROVIDER_SITE_OTHER): Payer: Self-pay

## 2023-11-08 ENCOUNTER — Other Ambulatory Visit: Payer: Self-pay | Admitting: Family Medicine

## 2023-11-08 DIAGNOSIS — E119 Type 2 diabetes mellitus without complications: Secondary | ICD-10-CM

## 2023-12-09 NOTE — Progress Notes (Signed)
 Reason for visit:  Alexis Sharp is seen for follow-up after endoscopic sinus surgery.   Date of Surgery: 04/26/21  Procedures performed: 1. Bilateral nasal endoscopy total ethmoidectomy, including sphenoidotomy with removal of tissue (CPT 31259-50)  2. Bilateral nasal endoscopy with frontal sinusotomy, (CPT [id number]).   3. Bilateral maxillary endoscopy with mucous membrane removal (CPT J2919857).    4. Endoscopic septoplasty (CPT 30520)  5. Stereotactic Computer assisted naviagtion, extradural (CPT [id number])    Operative Findings:  1. Severely deviated septum to the right, with deviation of the maxillary crest 2. Evidence of previous maxillary antrostomies bilaterally 3. Mucosal disease noted in bilateral maxillary and ethmoid sinuses 4. Left sided mucoperichondrial flap repaired with Durarepair 5. 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 male who returns today for an evaluation of chronic sinonasal complaints.   Interval Update 12/16/2023: The patient returns today stating that overall he feels much improved compared to his last office visit.  He has been rinsing with double strength mometasone and feels that his postnasal drip cough and production with irrigation is gone down significantly.  He is very compliant with his nasal regimen.  Interval Update 09/23/2023: Alexis Sharp is a 78 year old male who presents  for follow-up of chronic sinus issues.  He has been experiencing chronic sinus issues that have been persistent and challenging to manage. The mucus was previously difficult to clear and pervasive, but now it is localized to a specific site, making it easier to manage.  He has been using erythromycin and an antibiotic rinse for the past 90 days, which has led to a definite improvement in his symptoms. He continues to perform rinses and irrigations regularly.   Interval Update 06/24/2023: The patient returns to clinic for follow-up. He has not been using mometasone rinses as he has glaucoma and it raised his blood pressure. He also appreciates swelling of the cornea, which he did not previously have. He denies asthma symptoms.  Interval Update 03/18/2023: The patient returns to the clinic for follow-up.  He endorses an improvement in posterior nasal drainage since his last visit. He denies any voice changes, excessive throat clearing, facial pain or pressure. He performs saline irrigations 3 to 4 times a week. He still endorses a disturbance of smell and wonders when it will return. He has not had a trial of smell retraining.   Interval Update 10/22/2022: Patient returns today stating overall he is done well since his last office visit.  Continues to irrigate with some production.  He continues to appreciate some mild postnasal drip is worse in the morning.  He did have a recent ear and sinus infection for which she got antibiotics and he does feel like this is helped his ear symptoms.  Interval Update 06/25/2022: The patient returns today for follow-up. He feels his sinonasal symptoms are improved since his previous visit. He is doing nasal rinses BID. He is sleeping through the night. He appreciates PND, primarily in the morning. He was using azelastine  for a while but discontinued  use.   Interval Update 04/22/2022: The patient returns to clinic today for follow-up. He reports that he is doing much better  compared to last visit. He tolerated steroids and antibiotics well. He has continued to use an antibiotic nasal saline irrigation BID. He is using Azelastine  BID. He endorses good nasal breathing. He recently stopped using Nasacort  because he has glaucoma. He notes that he continues to have nasal drainage posteriorly which makes him cough, however this is clearer compared to previously. He has previously been told that he has asthma by a previous physician, however does not believe that he does. He does not believe that he currently has any infection. He has previously had surgery for reflux.   Interval Update 03/11/2022: The patient returns to clinic for follow-up. He endorses nasal discharge, but improved from prior to surgery. He is doing nasal saline rinses BID. He is occasionally using Nasacort  at night. He has not been on any antibiotics or oral steroids recently. He endorses aural fullness and decreased hearing on the left starting 7 days ago but feels his hearing is gradually improving starting yesterday. He denies otorrhea. He notes history of ear issues and was previously prescribed medication by Dr. Blair. He reports that when he pushes on the outside of his ear, he is able to hear again. He notes he has hearing aids in place bilaterally. He denies smoking cigarettes. He reports he last smoked when he was a teenager.   Interval Update 01/14/2022: The patient returns to clinic for follow-up. He is doing nasal saline rinses with added antibiotic packets. He endorses nasal drainage, but denies any difficulty breathing from his nose. He reports he lost his hearing a couple of months ago and saw Dr. Blair who told him he had fluid in his ears. He was prescribed antibiotics and prednisone  and reports his ears have cleared up. He notes his blood sugar went up while on prednisone .   Interval Update 08/21/2021: today for routine f/u. Feels better compared to pre-op. However has thick drainage and diminished  sense of smell. Can taste though. No congestion/pressure/pain. Doing bactroban  rinses. Finished doxy/pred.   Interval Update 06/26/2021: The patient returns for follow-up. He feels that he has a sinus infection and endorses throbbing in his nose. He notes that his sinonasal symptoms are worse on the let side. He reports that he cannot sleep through the night and needs to use his Neti Pot to clear out his nose. He endorses PND and coughing, mostly in the morning after laying down. He notes that when he uses azelastine , it is drying.   Interval Update 05/14/2021: Patient returns today stating that he feels improved compared to his last office visit.  He denies any infections and has been irrigating as instructed.  He does not use nasal steroids secondary to his glaucoma.  The patient denies dysphagia, odynophagia, hemoptysis, weight loss, acute vision changes, paresthesias, nuchal rigidity, or any other constitutional symptoms.  Review of Systems: As above and on the patient's intake form, otherwise the balance of 11 systems was negative. His RSDI score today was not completed, and is documented in the chart. His SNOT-22 score today was 9 and was previously 38, 48, documented in the chart.  SINO-NASAL OUTCOME TEST (SNOT-22)  Need to blow nose: Very mild problem Nasal Blockage: Mild or slight problem Sneezing: No problem Runny nose: No problem Cough: No problem Post nasal discharge: No problem Thick nasal discharge: Very mild problem Ear fullness: Very mild problem Dizziness: No problem  Ear pain: No problem Facial pain/pressure: No problem Decreased Sense of Smell/Taste: Severe problem Difficulty falling asleep: No problem Wake up at night: No problem Lack of good night's sleep: No problem Wake up tired: No problem Fatigue: No problem Reduced productivity: No problem Reduced concentration: No problem Frustrated/restless/irritable: No problem Sad: No problem Embarrassed: No  problem Snot-22 Total Score: 9  OBJECTIVE: Physical Exam  Vital Signs:  BP 129/86 (BP Position: Sitting)   Pulse 66   Temp 36.7 C (98 F)   Resp 18  General:  Well-developed, well-nourished Nose: On external exam there are neither lesions nor asymmetry of the nasal tip/ dorsum. On anterior rhinoscopy, visualization posteriorly is limited on anterior examination. For this reason, to adequately evaluate posteriorly for masses, polypoid disease and/or signs of infections, nasal endoscopy is indicated. (Please see procedure below.)  PROCEDURE NOTE  Bilateral Nasal Endoscopy with Debridement of Sinonasal Cavities  Procedure: Bilateral Nasal Endoscopy with Debridement of Sinonasal Cavity (CPT [phone])  Indication: Chronic sinusitis symptoms with crusting and debris. On anterior rhinoscopy, visualization posteriorly is limited on anterior examination.  The turbinates, nasal cavity, middle and superior meatus are all examined and debrided during this procedure. For this reason, to adequately evaluate posteriorly for masses, polypoid disease and/or signs of infections, nasal endoscopy is indicated. (Please see procedure below.)  Consent: After discussion of the risks of the procedure (primarily pain and bleeding) and obtaining informed verbal consent, a time-out was performed confirming the patient's name, birthdate, and procedure to be performed.  Surgeon: Carlin CANDIE Radford, Mickey., MD, MPH (entire procedure performed by surgeon)  Anesthesia: None  Complications: None  Disposition: Discharged home in stable condition.   Findings:  On the right, scattered crust and debris in ethmoid with below-mentioned instruments. No significant polypoid  edema noted.  The mucosa in maxillary, ethmoid and frontal recess appears flat and healthy. All sinuses were widely patent.   On the left, scattered crusts and debris in ethmoid and maxillary removed with below-mentioned instruments.  Very mild  polypoid   edema in  frontal recess.  Mucus in the sphenoid posterior ethmoid and maxillary sinus appears flat and healthy.  All sinuses were widely patent.  Procedure Description: The 2.7 mm 30 degree telescope was used to perform nasal endoscopy on each side. Curved suctions and Blakesley forceps were used to remove crust and fibrin debris from each side without complications. Findings are listed above. The scope was then withdrawn and removed. The patient tolerated this well without complications.  NOTE: Debridement is performed for the sinuses only, and not for treatment of the septum or inferior turbinates, nor is it related to any previously performed skull base surgery, septoplasty or inferior turbinate surgery.   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 Mucus thick but easier to clear. Several sinuses normal. - Continue DDM - Follow up in 6 months.  The patient understands and agrees with this plan.   Note - This record has been created using [employer]. Chart creation errors have been sought, but may not always have been located. Such creation errors do not reflect on the standard of medical care.

## 2023-12-12 ENCOUNTER — Other Ambulatory Visit: Payer: Self-pay | Admitting: Family Medicine

## 2023-12-12 DIAGNOSIS — E119 Type 2 diabetes mellitus without complications: Secondary | ICD-10-CM

## 2023-12-16 DIAGNOSIS — J329 Chronic sinusitis, unspecified: Secondary | ICD-10-CM | POA: Diagnosis not present

## 2024-01-15 NOTE — Progress Notes (Signed)
 Cardiology Office Note  Date:  01/18/2024   ID:  JERIC SLAGEL, DOB [birth year], MRN [id number]  PCP:  Keven Crumbly Pap, MD   Chief Complaint  Patient presents with   Follow-up    C/o -Coronary artery calcification, no chest pain or shortness of breath.    HPI:  Mr. 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 Coronary calcium  score of 583. This was 71st percentile for age and sex matched control. 2. CAC >300 in LM, LAD, LCx, RCA. CAC-DRS A3/N3.   EKG personally reviewed by myself on todays visit EKG Interpretation Date/Time:  Monday January 18 2024 08:05:02 EDT Ventricular Rate:  65 PR Interval:    QRS Duration:  74 QT Interval:  376 QTC Calculation: 391 R Axis:   -3  Text Interpretation: Normal sinus rhythm When compared with ECG of 28-Aug-2020 08:08, No significant change was found Confirmed by Perla Lye [phone]) on 01/18/2024 8:10:21 AM    PMH:   has a past medical history of Actinic keratosis, Allergy, Annual physical exam (12/25/2010), Diabetes mellitus, Freckle, Glaucoma, Glaucoma, Glaucoma (02/27/2021), Glaucoma of both eyes (04/19/2007), HTN (hypertension) (08/06/2018), Hyperlipidemia, Migraines, Otitis media (12/03/2021), Reflux esophagitis, Reflux esophagitis (07/20/2014), Sensory loss (12/02/2016), 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  PSH:    Past Surgical History:  Procedure Laterality Date   CATARACT EXTRACTION, BILATERAL      COLONOSCOPY WITH PROPOFOL  N/A 09/11/2017   Procedure: COLONOSCOPY WITH PROPOFOL ;  Surgeon: Viktoria Lamar DASEN, MD;  Location: [facility] ENDOSCOPY;  Service: Endoscopy;  Laterality: N/A;   ESOPHAGOGASTRODUODENOSCOPY (EGD) WITH PROPOFOL  N/A 02/02/2018   Procedure: ESOPHAGOGASTRODUODENOSCOPY (EGD) WITH PROPOFOL ;  Surgeon: Therisa Bi, MD;  Location: [facility] ENDOSCOPY;  Service: Gastroenterology;  Laterality: N/A;   EYE SURGERY     glaucoma left , glaucoma right x 2 Dr. Sandralee    HERNIA REPAIR     IMAGE GUIDED SINUS SURGERY Bilateral 02/23/2018   Procedure: IMAGE GUIDED SINUS SURGERY Right ethmoidectomy with frontal exploration bilteral maxillary antrostomies Right sphenoidectomy;  Surgeon: Blair Mt, MD;  Location: [facility];  Service: ENT;  Laterality: Bilateral;  NEED STRYKER DISK GAVE DISK TO CECE 9-9   NASAL FRACTURE SURGERY     younger age   NASAL SINUS SURGERY     04/26/21   UMBILICAL HERNIA REPAIR  2002   XI ROBOTIC ASSISTED PARAESOPHAGEAL HERNIA REPAIR N/A 08/30/2020   Procedure: XI ROBOTIC ASSISTED PARAESOPHAGEAL HERNIA REPAIR with Robertha Rakers, RNFA to assist;  Surgeon: Jordis Laneta FALCON, MD;  Location: [facility] ORS;  Service: General;  Laterality: N/A;    Current Outpatient Medications  Medication Sig Dispense Refill   albuterol  (VENTOLIN  HFA) 108 (90 Base) MCG/ACT inhaler Inhale 1-2 puffs into the lungs every 6 (six) hours as needed for wheezing or shortness of breath. 18 g 11   aspirin 81 MG chewable tablet Chew by mouth.     Azelastine  HCl 137 MCG/SPRAY SOLN Use 1 spray(s) in each nostril once daily 30 mL 2   Continuous Glucose Sensor (FREESTYLE  LIBRE 14 DAY SENSOR) MISC USE ONE SENSOR EVERY 14 DAYS 2 each 0   empagliflozin  (JARDIANCE ) 25 MG TABS tablet Take 1 tablet (25 mg total) by mouth daily. 90 tablet 3   ezetimibe  (ZETIA ) 10 MG tablet Take 1 tablet (10 mg total) by mouth daily. 90 tablet 3   glucose blood (ONETOUCH ULTRA) test strip USE 1 STRIP TO CHECK GLUCOSE TWICE  DAILY 180 each 3   LANTUS  SOLOSTAR 100 UNIT/ML Solostar Pen INJECT 23 UNITS SUBCUTANEOUSLY EVERY MORNING 27 mL 6   metFORMIN  (GLUCOPHAGE ) 500 MG tablet TAKE 1 TABLET BY MOUTH IN THE MORNING WITH FOOD AND 1 TABLET IN THE EVENING WITH FOOD     Multiple Vitamin (MULTIVITAMIN) capsule Take 1 capsule by mouth daily.     omeprazole  (PRILOSEC) 20 MG capsule Take 1 capsule (20 mg total) by mouth daily. 90 capsule 3   ONETOUCH DELICA LANCETS FINE MISC Use to check blood sugar once a day. Dx Code E11.9 100 each 3   Probiotic Product (PROBIOTIC DAILY PO) Take 1 tablet by mouth daily.     timolol (BETIMOL) 0.25 % ophthalmic solution 1-2 drops two (2) times a day.     VITAMIN D , CHOLECALCIFEROL, PO Take 1 tablet by mouth daily.     VYZULTA 0.024 % SOLN Apply 1 drop to eye daily.     fluticasone (FLONASE) 50 MCG/ACT nasal spray Place into the nose. (Patient not taking: Reported on 01/18/2024)     No current facility-administered medications for this visit.    Allergies:   Codeine, Pioglitazone, Sitagliptin phosphate, Atorvastatin , Other, Rosuvastatin , and Tape   Social History:  The patient  reports that he has quit smoking. He has never used smokeless tobacco. He reports that he does not drink alcohol and does not use drugs.   Family History:   family history includes Diabetes in his brother, brother, brother, mother, sister, sister, and sister; Heart disease in his brother and brother.    Review of Systems: Review of Systems  Constitutional: Negative.   HENT: Negative.    Respiratory: Negative.    Cardiovascular: Negative.   Gastrointestinal: Negative.   Musculoskeletal: Negative.   Neurological: Negative.   Psychiatric/Behavioral: Negative.    All other systems reviewed and are negative.    PHYSICAL EXAM: VS:  BP 110/80 (BP Location: Right Arm, Patient Position: Sitting)   Pulse 65   Ht 5' 11.5 (1.816 m)   Wt 182 lb 12.8 oz (82.9 kg)   SpO2 94%   BMI 25.14 kg/m  , BMI Body mass index  is 25.14 kg/m. Constitutional:  oriented to person, place, and time. No distress.  HENT:  Head: Grossly normal Eyes:  no discharge. No scleral icterus.  Neck: No JVD, no carotid bruits  Cardiovascular: Regular rate and rhythm, no murmurs appreciated Pulmonary/Chest: Clear to auscultation bilaterally, no wheezes or rails Abdominal: Soft.  no distension.  no tenderness.  Musculoskeletal: Normal range of motion Neurological:  normal muscle tone. Coordination normal. No atrophy Skin: Skin warm and dry Psychiatric: normal affect, pleasant  Recent Labs: 03/16/2023: ALT 10; BUN 13; Creatinine, Ser 0.81; Hemoglobin 14.9; Platelets 239.0; Potassium 4.5; Sodium 138   Lipid Panel Lab Results  Component Value Date   CHOL 176 08/07/2022   HDL 45.20 08/07/2022   LDLCALC 115 (H) 08/07/2022   TRIG 79.0 08/07/2022     Wt Readings from Last 3 Encounters:  01/18/24 182 lb 12.8 oz (82.9 kg)  03/16/23 181 lb 6 oz (82.3 kg)  12/10/22  186 lb (84.4 kg)     ASSESSMENT AND PLAN:  Problem List Items Addressed This Visit       Cardiology Problems   Hyperlipidemia associated with type 2 diabetes mellitus (HCC)     Other   Type 2 diabetes with complication (HCC)   Statin myopathy   Other Visit Diagnoses       Coronary artery calcification    -  Primary   Relevant Orders   EKG 12-Lead (Completed)     Primary hypertension       Relevant Orders   EKG 12-Lead (Completed)     Mixed hyperlipidemia          Coronary calcification on CT scan Calcium  score 500 in September 2022 calcification in all 3 vessels Statin myalgia  tolerating Zetia  He has repeat lipid panel pending Discussed bempedoic acid versus PCSK9 inhibitor/Repatha  He will research these and let us  know which when he would like to try  Essential hypertension Blood pressure is well controlled on today's visit. No changes made to the medications.  Hyperlipidemia Continue Zetia  10 daily On today's visit we did discuss  bempedoic acid or PCSK9 inhibitor, He will wait on this weeks lipid panel before making a decision then discussed with pharmacy concerning the co-pay  Signed, Velinda Lunger, M.D., Ph.D. [facility] [place], [place] [phone]

## 2024-01-18 ENCOUNTER — Ambulatory Visit: Attending: Cardiovascular Disease | Admitting: Cardiovascular Disease

## 2024-01-18 ENCOUNTER — Encounter: Payer: Self-pay | Admitting: Cardiovascular Disease

## 2024-01-18 VITALS — BP 110/80 | HR 65 | Ht 71.5 in | Wt 182.8 lb

## 2024-01-18 DIAGNOSIS — E785 Hyperlipidemia, unspecified: Secondary | ICD-10-CM | POA: Diagnosis not present

## 2024-01-18 DIAGNOSIS — I2510 Atherosclerotic heart disease of native coronary artery without angina pectoris: Secondary | ICD-10-CM | POA: Diagnosis not present

## 2024-01-18 DIAGNOSIS — I10 Essential (primary) hypertension: Secondary | ICD-10-CM

## 2024-01-18 DIAGNOSIS — E118 Type 2 diabetes mellitus with unspecified complications: Secondary | ICD-10-CM | POA: Diagnosis not present

## 2024-01-18 DIAGNOSIS — T466X5D Adverse effect of antihyperlipidemic and antiarteriosclerotic drugs, subsequent encounter: Secondary | ICD-10-CM

## 2024-01-18 DIAGNOSIS — E1169 Type 2 diabetes mellitus with other specified complication: Secondary | ICD-10-CM | POA: Diagnosis not present

## 2024-01-18 DIAGNOSIS — G720 Drug-induced myopathy: Secondary | ICD-10-CM

## 2024-01-18 DIAGNOSIS — E782 Mixed hyperlipidemia: Secondary | ICD-10-CM | POA: Diagnosis not present

## 2024-01-18 NOTE — Patient Instructions (Signed)
 Medication Instructions:  No changes  Think about repatha  shot or bempidoic acid pill for cholesterol  If you need a refill on your cardiac medications before your next appointment, please call your pharmacy.   Lab work: No new labs needed  Testing/Procedures: No new testing needed  Follow-Up: At [facility], you and your health needs are our priority.  As part of our continuing mission to provide you with exceptional heart care, we have created designated Provider Care Teams.  These Care Teams include your primary Cardiologist (physician) and Advanced Practice Providers (APPs -  Physician Assistants and Nurse Practitioners) who all work together to provide you with the care you need, when you need it.  You will need a follow up appointment in 12 months  Providers on your designated Care Team:   Lonni Meager, NP Bernardino Bring, PA-C Cadence [place], [place]  COVID-19 Vaccine Information can be found at: [email or web address] For questions related to vaccine distribution or appointments, please email [email or web address] or call [phone].

## 2024-01-19 DIAGNOSIS — E119 Type 2 diabetes mellitus without complications: Secondary | ICD-10-CM | POA: Diagnosis not present

## 2024-01-19 DIAGNOSIS — K219 Gastro-esophageal reflux disease without esophagitis: Secondary | ICD-10-CM | POA: Diagnosis not present

## 2024-01-19 DIAGNOSIS — H409 Unspecified glaucoma: Secondary | ICD-10-CM | POA: Diagnosis not present

## 2024-01-19 DIAGNOSIS — Z0001 Encounter for general adult medical examination with abnormal findings: Secondary | ICD-10-CM | POA: Diagnosis not present

## 2024-01-19 DIAGNOSIS — J324 Chronic pansinusitis: Secondary | ICD-10-CM | POA: Diagnosis not present

## 2024-01-19 DIAGNOSIS — Z794 Long term (current) use of insulin: Secondary | ICD-10-CM | POA: Diagnosis not present

## 2024-01-19 DIAGNOSIS — E781 Pure hyperglyceridemia: Secondary | ICD-10-CM | POA: Diagnosis not present

## 2024-02-01 DIAGNOSIS — H18233 Secondary corneal edema, bilateral: Secondary | ICD-10-CM | POA: Diagnosis not present

## 2024-02-01 DIAGNOSIS — H409 Unspecified glaucoma: Secondary | ICD-10-CM | POA: Diagnosis not present

## 2024-02-01 DIAGNOSIS — Z947 Corneal transplant status: Secondary | ICD-10-CM | POA: Diagnosis not present

## 2024-02-01 DIAGNOSIS — D3130 Benign neoplasm of unspecified choroid: Secondary | ICD-10-CM | POA: Diagnosis not present

## 2024-02-19 DIAGNOSIS — H40043 Steroid responder, bilateral: Secondary | ICD-10-CM | POA: Diagnosis not present

## 2024-02-19 DIAGNOSIS — H401133 Primary open-angle glaucoma, bilateral, severe stage: Secondary | ICD-10-CM | POA: Diagnosis not present

## 2024-03-15 DIAGNOSIS — E1139 Type 2 diabetes mellitus with other diabetic ophthalmic complication: Secondary | ICD-10-CM | POA: Diagnosis not present

## 2024-03-15 DIAGNOSIS — Z833 Family history of diabetes mellitus: Secondary | ICD-10-CM | POA: Diagnosis not present

## 2024-03-15 DIAGNOSIS — Z7985 Long-term (current) use of injectable non-insulin antidiabetic drugs: Secondary | ICD-10-CM | POA: Diagnosis not present

## 2024-03-15 DIAGNOSIS — I10 Essential (primary) hypertension: Secondary | ICD-10-CM | POA: Diagnosis not present

## 2024-03-15 DIAGNOSIS — H4089 Other specified glaucoma: Secondary | ICD-10-CM | POA: Diagnosis not present

## 2024-03-15 DIAGNOSIS — H401123 Primary open-angle glaucoma, left eye, severe stage: Secondary | ICD-10-CM | POA: Diagnosis not present

## 2024-03-15 DIAGNOSIS — E119 Type 2 diabetes mellitus without complications: Secondary | ICD-10-CM | POA: Diagnosis not present

## 2024-03-15 DIAGNOSIS — G43909 Migraine, unspecified, not intractable, without status migrainosus: Secondary | ICD-10-CM | POA: Diagnosis not present

## 2024-03-15 DIAGNOSIS — Z794 Long term (current) use of insulin: Secondary | ICD-10-CM | POA: Diagnosis not present

## 2024-03-15 DIAGNOSIS — Z885 Allergy status to narcotic agent status: Secondary | ICD-10-CM | POA: Diagnosis not present

## 2024-03-15 DIAGNOSIS — Z7984 Long term (current) use of oral hypoglycemic drugs: Secondary | ICD-10-CM | POA: Diagnosis not present

## 2024-03-15 DIAGNOSIS — Z87891 Personal history of nicotine dependence: Secondary | ICD-10-CM | POA: Diagnosis not present

## 2024-03-15 DIAGNOSIS — K219 Gastro-esophageal reflux disease without esophagitis: Secondary | ICD-10-CM | POA: Diagnosis not present

## 2024-03-16 NOTE — Progress Notes (Addendum)
 0.  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   RTC 1 week for IOP check, patient instructed to call sooner if they are experiencing increased pain, redness, nausea and/or vomiting

## 2024-03-17 ENCOUNTER — Ambulatory Visit: Attending: Cardiology | Admitting: Cardiology

## 2024-03-17 ENCOUNTER — Encounter: Payer: Self-pay | Admitting: Cardiology

## 2024-03-17 VITALS — BP 130/70 | HR 59 | Ht 71.0 in | Wt 184.4 lb

## 2024-03-17 DIAGNOSIS — R9431 Abnormal electrocardiogram [ECG] [EKG]: Secondary | ICD-10-CM | POA: Diagnosis not present

## 2024-03-17 DIAGNOSIS — I10 Essential (primary) hypertension: Secondary | ICD-10-CM | POA: Diagnosis not present

## 2024-03-17 DIAGNOSIS — I2510 Atherosclerotic heart disease of native coronary artery without angina pectoris: Secondary | ICD-10-CM | POA: Diagnosis not present

## 2024-03-17 DIAGNOSIS — E782 Mixed hyperlipidemia: Secondary | ICD-10-CM

## 2024-03-17 NOTE — Patient Instructions (Signed)
 Medication Instructions:  Your physician recommends that you continue on your current medications as directed. Please refer to the Current Medication list given to you today.   *If you need a refill on your cardiac medications before your next appointment, please call your pharmacy*  Lab Work: No labs ordered today  If you have labs (blood work) drawn today and your tests are completely normal, you will receive your results only by: MyChart Message (if you have MyChart) OR A paper copy in the mail If you have any lab test that is abnormal or we need to change your treatment, we will call you to review the results.  Testing/Procedures: No test ordered today   Follow-Up: At [facility], you and your health needs are our priority.  As part of our continuing mission to provide you with exceptional heart care, our providers are all part of one team.  This team includes your primary Cardiologist (physician) and Advanced Practice Providers or APPs (Physician Assistants and Nurse Practitioners) who all work together to provide you with the care you need, when you need it.  Your next appointment:   12 month(s)  Provider:   You may see Timothy Gollan, MD, or one of the following Advanced Practice Providers on your designated Care Team:   Lonni Meager, NP Lesley Maffucci, PA-C Bernardino Bring, PA-C Cadence [place], PA-C Tylene Lunch, NP Barnie Hila, NP    We recommend signing up for the patient portal called MyChart.  Sign up information is provided on this After Visit Summary.  MyChart is used to connect with patients for Virtual Visits (Telemedicine).  Patients are able to view lab/test results, encounter notes, upcoming appointments, etc.  Non-urgent messages can be sent to your provider as well.   To learn more about what you can do with MyChart, go to [email or web address].

## 2024-03-17 NOTE — Progress Notes (Signed)
 Cardiology Office Note   Date:  03/17/2024  ID:  Alexis Sharp, DOB 11/16/1945, MRN [id number] PCP: Keven Crumbly Pap, MD  [facility] Providers Cardiologist:  None     History of Present Illness Alexis Sharp is a 78 y.o. male with a past medical history of coronary artery disease (coronary calcification with a score of 553 in 2022), type 2 diabetes, primary pretension, mixed hyperlipidemia, who is here today for follow-up.  Previous calcium  score completed in in September 2022 revealed a calcium  score of 583 recommended aspirin and statin continue heart healthy lifestyle and risk factor modification.  Total cholesterol 148 LDL 91.  He was taken ezetimibe  10 mg daily previously on rosuvastatin  every other day and had myalgias.  In follow-up he reported  feeling well and did not need chest pain concerning for angina continue to remain active playing golf.  He was seen in clinic 09/03/2022 recent testing was reviewed.  ABIs performed were in the normal range bilaterally.  He was last seen in clinic 8//25 by Dr. Gollan. In follow-up he reported feeling well denying chest pain concerning for angina.  Remained active playing golf.  Reviewed ABIs from October 2024 they are within normal range bilaterally.  EKG revealed sinus rhythm with a rate of 65 with no significant changes noted.  Blood pressure was well-controlled he was continued on ezetimibe  and was going to research bempedoic acid versus PCSK9 inhibitor therapy to get LDL more at goal.  There were no further testing that was ordered or medication changes that were made at his visit.  He underwent eye surgery at [facility] and was given a copy of his EKG by the anesthesiologist and was advised that he need to follow-up with cardiology has been scanned into the media tab.  Unfortunately the tracing of read EKG is atrial fibrillation but he was normal sinus with sinus arrhythmia and PACs.  He is asymptomatic.  He returns to clinic today  accompanied by his wife after calling demanding to be seen through the call center yesterday.  He is totally asymptomatic without any complaints today just extremely concerned after being told he needed to be seen for an abnormal EKG.  He denies palpitations, lightheadedness, dizziness, chest pain, shortness of breath, or peripheral edema.  EKG revealed sinus sinus arrhythmia with PACs.  His wife has longstanding history of atrial fibrillation and is aware of triggers.  He has a limited caffeine intake and has adequate hydration and typically stays active.  He is compliant with his current medications and has done well since his procedure has been completed.  ROS: 10 point review of systems were reviewed and considered negative except ones were listed in HPI  Studies Reviewed     Calcium  Scoring 02/15/2021 IMPRESSION AND RECOMMENDATION: 1. Coronary calcium  score of 583. This was 71st percentile for age and sex matched control.   2. CAC >300 in LM, LAD, LCx, RCA. CAC-DRS A3/N3.   3. Recommend asa and statin if no contraindications.   4. Continue heart healthy lifestyle and risk factor modification. Risk Assessment/Calculations           Physical Exam VS:  BP 130/70 (BP Location: Left Arm, Patient Position: Sitting, Cuff Size: Normal)   Pulse (!) 59   Ht 5' 11 (1.803 m)   Wt 184 lb 6.4 oz (83.6 kg)   SpO2 97%   BMI 25.72 kg/m        Wt Readings from Last 3 Encounters:  03/17/24 184 lb  6.4 oz (83.6 kg)  01/18/24 182 lb 12.8 oz (82.9 kg)  03/16/23 181 lb 6 oz (82.3 kg)    GEN: Well nourished, well developed in no acute distress NECK: No JVD; No carotid bruits CARDIAC: RRR, no murmurs, rubs, gallops RESPIRATORY:  Clear to auscultation without rales, wheezing or rhonchi  ABDOMEN: Soft, non-tender, non-distended EXTREMITIES:  No edema; No deformity   ASSESSMENT AND PLAN Coronary artery disease with coronary calcification on CT scan with coronary calcium  score of 583.  He continues  to deny angina or anginal equivalents.  EKG done yesterday at [facility] revealed sinus with sinus arrhythmia and PACs.  He denies any palpitations chest pain or chest pressure.  He has continued on ezetimibe  10 mg daily aspirin 81 mg daily.  No further ischemic evaluation is needed at this time.  Primary hypertension with a blood pressure 130/70.  Blood pressures remain stable.  He is encouraged to continue to monitor his pressures at home as well.  Mixed hyperlipidemia with an LDL of 115 which continues to remain not at goal.  He is continued on ezetimibe  10 mg daily.  He is to continue to do his research on bempedoic acid and PCSK9 inhibitors.  Will his upcoming lab visit with PCP in February for his annual visit.  On his return with [facility] will determine the next steps in which medications to use to control his cholesterol.  Abnormal EKG with PACs [facility].  Advised patient and wife if he starts to have palpitations some are abnormal heart rhythm to call the office and we can mail him a ZIO XT monitor wear for 2 weeks to rule out any other concerns for atrial fibrillation or arrhythmia prior to his follow-up with his primary cardiologist.       Dispo: Patient to return to clinic to see primary cardiologist at previously scheduled appointment  Signed, Demarquez Ciolek, NP

## 2024-05-10 ENCOUNTER — Ambulatory Visit: Admitting: Dermatology

## 2024-05-25 ENCOUNTER — Ambulatory Visit: Admitting: Dermatology

## 2024-05-25 ENCOUNTER — Encounter: Payer: Self-pay | Admitting: Dermatology

## 2024-05-25 DIAGNOSIS — W908XXA Exposure to other nonionizing radiation, initial encounter: Secondary | ICD-10-CM | POA: Diagnosis not present

## 2024-05-25 DIAGNOSIS — L578 Other skin changes due to chronic exposure to nonionizing radiation: Secondary | ICD-10-CM

## 2024-05-25 DIAGNOSIS — L853 Xerosis cutis: Secondary | ICD-10-CM | POA: Diagnosis not present

## 2024-05-25 DIAGNOSIS — L570 Actinic keratosis: Secondary | ICD-10-CM

## 2024-05-25 DIAGNOSIS — L820 Inflamed seborrheic keratosis: Secondary | ICD-10-CM

## 2024-05-25 NOTE — Patient Instructions (Addendum)
 Cryotherapy Aftercare  Wash gently with soap and water everyday.   Apply Vaseline and Band-Aid daily until healed.    Recommend daily broad spectrum sunscreen SPF 30+ to sun-exposed areas, reapply every 2 hours as needed. Call for new or changing lesions.  Staying in the shade or wearing long sleeves, sun glasses (UVA+UVB protection) and wide brim hats (4-inch brim around the entire circumference of the hat) are also recommended for sun protection.     Due to recent changes in healthcare laws, you may see results of your pathology and/or laboratory studies on MyChart before the doctors have had a chance to review them. We understand that in some cases there may be results that are confusing or concerning to you. Please understand that not all results are received at the same time and often the doctors may need to interpret multiple results in order to provide you with the best plan of care or course of treatment. Therefore, we ask that you please give us  2 business days to thoroughly review all your results before contacting the office for clarification. Should we see a critical lab result, you will be contacted sooner.   If You Need Anything After Your Visit  If you have any questions or concerns for your doctor, please call our main line at [phone] and press option 4 to reach your doctor's medical assistant. If no one answers, please leave a voicemail as directed and we will return your call as soon as possible. Messages left after 4 pm will be answered the following business day.   You may also send us  a message via MyChart. We typically respond to MyChart messages within 1-2 business days.  For prescription refills, please ask your pharmacy to contact our office. Our fax number is [phone].  If you have an urgent issue when the clinic is closed that cannot wait until the next business day, you can page your doctor at the number below.    Please note that while we do our best to  be available for urgent issues outside of office hours, we are not available 24/7.   If you have an urgent issue and are unable to reach us , you may choose to seek medical care at your doctor's office, retail clinic, urgent care center, or emergency room.  If you have a medical emergency, please immediately call 911 or go to the emergency department.  Pager Numbers  - Dr. Hester: [phone]  - Dr. Jackquline: [phone]  - Dr. Claudene: [phone]   - Dr. Raymund: [phone]  In the event of inclement weather, please call our main line at [phone] for an update on the status of any delays or closures.  Dermatology Medication Tips: Please keep the boxes that topical medications come in in order to help keep track of the instructions about where and how to use these. Pharmacies typically print the medication instructions only on the boxes and not directly on the medication tubes.   If your medication is too expensive, please contact our office at [phone] option 4 or send us  a message through MyChart.   We are unable to tell what your co-pay for medications will be in advance as this is different depending on your insurance coverage. However, we may be able to find a substitute medication at lower cost or fill out paperwork to get insurance to cover a needed medication.   If a prior authorization is required to get your medication covered by your insurance company, please allow us  1-2  business days to complete this process.  Drug prices often vary depending on where the prescription is filled and some pharmacies may offer cheaper prices.  The website www.goodrx.com contains coupons for medications through different pharmacies. The prices here do not account for what the cost may be with help from insurance (it may be cheaper with your insurance), but the website can give you the price if you did not use any insurance.  - You can print the associated coupon and take it with your  prescription to the pharmacy.  - You may also stop by our office during regular business hours and pick up a GoodRx coupon card.  - If you need your prescription sent electronically to a different pharmacy, notify our office through [facility] or by phone at [phone] option 4.     Si Usted Necesita Algo Despus de Su Visita  Tambin puede enviarnos un mensaje a travs de Clinical cytogeneticist. Por lo general respondemos a los mensajes de MyChart en el transcurso de 1 a 2 das hbiles.  Para renovar recetas, por favor pida a su farmacia que se ponga en contacto con nuestra oficina. Randi lakes de fax es [place] [phone].  Si tiene un asunto urgente cuando la clnica est cerrada y que no puede esperar hasta el siguiente da hbil, puede llamar/localizar a su doctor(a) al nmero que aparece a continuacin.   Por favor, tenga en cuenta que aunque hacemos todo lo posible para estar disponibles para asuntos urgentes fuera del horario de [place], no estamos disponibles las 24 horas del da, los 7 [address]  [address] de la [place].   Si tiene un problema urgente y no puede comunicarse con nosotros, puede optar por buscar atencin mdica  en el consultorio de su doctor(a), en una clnica privada, en un centro de atencin urgente o en una sala de emergencias.  Si tiene Engineer, drilling, por favor llame inmediatamente al 911 o vaya a la sala de emergencias.  Nmeros de bper  - Dr. Hester: [phone]  - Dra. Jackquline: [phone]  - Dr. Claudene: [phone]  - Dra. Kitts: [phone]  En caso de inclemencias del [place], por favor llame a nuestra lnea principal al [phone] para una actualizacin sobre el estado de cualquier retraso o cierre.  Consejos para la medicacin en dermatologa: Por favor, guarde las cajas en las que vienen los medicamentos de uso tpico para ayudarle a seguir las instrucciones sobre dnde y cmo usarlos. Las farmacias generalmente imprimen las instrucciones del  medicamento slo en las cajas y no directamente en los tubos del [place].   Si su medicamento es muy caro, por favor, pngase en contacto con landry rieger llamando al [phone] y presione la opcin 4 o envenos un mensaje a travs de Clinical cytogeneticist.   No podemos decirle cul ser su copago por los medicamentos por adelantado ya que esto es diferente dependiendo de la cobertura de su seguro. Sin embargo, es posible que podamos encontrar un medicamento sustituto a Audiological scientist un formulario para que el seguro cubra el medicamento que se considera necesario.   Si se requiere una autorizacin previa para que su compaa de seguros malta su medicamento, por favor permtanos de 1 a 2 das hbiles para completar este proceso.  Los precios de los medicamentos varan con frecuencia dependiendo del Environmental consultant de dnde se surte la receta y alguna farmacias pueden ofrecer precios ms baratos.  El sitio web www.goodrx.com tiene cupones para medicamentos de Health and safety inspector. Los precios aqu no tienen en cuenta  lo que podra costar con la ayuda del seguro (puede ser ms barato con su seguro), pero el sitio web puede darle el precio si no Visual merchandiser.  - Puede imprimir el cupn correspondiente y llevarlo con su receta a la farmacia.  - Tambin puede pasar por nuestra oficina durante el horario de atencin regular y Education officer, museum una tarjeta de cupones de GoodRx.  - Si necesita que su receta se enve electrnicamente a una farmacia diferente, informe a nuestra oficina a travs de MyChart de [place] o por telfono llamando al [phone] y presione la opcin 4.

## 2024-05-25 NOTE — Progress Notes (Signed)
 Follow-Up Visit   Subjective  Alexis Sharp is a 78 y.o. male who presents for the following: AK 67m f/u, face, hx of Dermatitis 78m f/u, R neck, check L ear hx of AK    The following portions of the chart were reviewed this encounter and updated as appropriate: medications, allergies, medical history  Review of Systems:  No other skin or systemic complaints except as noted in HPI or Assessment and Plan.  Objective  Well appearing patient in no apparent distress; mood and affect are within normal limits.   A focused examination was performed of the following areas: Face, ear, scalp, neck, arms, hands  Relevant exam findings are noted in the Assessment and Plan.  L antehelix x 1, L medial cheek x 1, R temple x 1, R forehead x 2, R frontal scalp x 1, Inferior vertex scalp x 2 (8) Pink scaly macules R neck x 1 Pink tan slightly waxy macule  Assessment & Plan   ACTINIC DAMAGE - chronic, secondary to cumulative UV radiation exposure/sun exposure over time - diffuse scaly erythematous macules with underlying dyspigmentation - Recommend daily broad spectrum sunscreen SPF 30+ to sun-exposed areas, reapply every 2 hours as needed.  - Recommend staying in the shade or wearing long sleeves, sun glasses (UVA+UVB protection) and wide brim hats (4-inch brim around the entire circumference of the hat). - Call for new or changing lesions.   AK (ACTINIC KERATOSIS) (8) L antehelix x 1, L medial cheek x 1, R temple x 1, R forehead x 2, R frontal scalp x 1, Inferior vertex scalp x 2 (8) Actinic keratoses are precancerous spots that appear secondary to cumulative UV radiation exposure/sun exposure over time. They are chronic with expected duration over 1 year. A portion of actinic keratoses will progress to squamous cell carcinoma of the skin. It is not possible to reliably predict which spots will progress to skin cancer and so treatment is recommended to prevent development of skin  cancer.  Recommend daily broad spectrum sunscreen SPF 30+ to sun-exposed areas, reapply every 2 hours as needed.  Recommend staying in the shade or wearing long sleeves, sun glasses (UVA+UVB protection) and wide brim hats (4-inch brim around the entire circumference of the hat). Call for new or changing lesions. Destruction of lesion - L antehelix x 1, L medial cheek x 1, R temple x 1, R forehead x 2, R frontal scalp x 1, Inferior vertex scalp x 2 (8)  Destruction method: cryotherapy   Informed consent: discussed and consent obtained   Lesion destroyed using liquid nitrogen: Yes   Region frozen until ice ball extended beyond lesion: Yes   Outcome: patient tolerated procedure well with no complications   Post-procedure details: wound care instructions given   Additional details:  Prior to procedure, discussed risks of blister formation, small wound, skin dyspigmentation, or rare scar following cryotherapy. Recommend Vaseline ointment to treated areas while healing.   INFLAMED SEBORRHEIC KERATOSIS R neck x 1 Still present after use of topical HC cream  LN2 today, Recheck on f/u Destruction of lesion - R neck x 1  Destruction method: cryotherapy   Informed consent: discussed and consent obtained   Lesion destroyed using liquid nitrogen: Yes   Region frozen until ice ball extended beyond lesion: Yes   Outcome: patient tolerated procedure well with no complications   Post-procedure details: wound care instructions given   Additional details:  Prior to procedure, discussed risks of blister formation, small wound, skin dyspigmentation,  or rare scar following cryotherapy. Recommend Vaseline ointment to treated areas while healing.    Xerosis - diffuse xerotic patches - recommend gentle, hydrating skin care - gentle skin care handout given   Return in about 6 months (around 11/23/2024) for TBSE, Hx of AKs, Recheck ISK R neck.  I, Grayce Saunas, RMA, am acting as scribe for Rexene Rattler,  MD .   Documentation: I have reviewed the above documentation for accuracy and completeness, and I agree with the above.  Rexene Rattler, MD

## 2024-12-05 ENCOUNTER — Encounter: Admitting: Dermatology
